# Patient Record
Sex: Female | Born: 1975 | Race: White | Hispanic: No | Marital: Single | Smoking: Never smoker
Health system: Southern US, Community
[De-identification: ages and names within clinical notes are randomized; demographics above are authoritative.]

## PROBLEM LIST (undated history)

## (undated) DIAGNOSIS — J45909 Unspecified asthma, uncomplicated: Secondary | ICD-10-CM

## (undated) DIAGNOSIS — R519 Headache, unspecified: Secondary | ICD-10-CM

## (undated) DIAGNOSIS — F419 Anxiety disorder, unspecified: Secondary | ICD-10-CM

## (undated) DIAGNOSIS — G8220 Paraplegia, unspecified: Secondary | ICD-10-CM

## (undated) DIAGNOSIS — G809 Cerebral palsy, unspecified: Secondary | ICD-10-CM

## (undated) DIAGNOSIS — D649 Anemia, unspecified: Secondary | ICD-10-CM

## (undated) DIAGNOSIS — Z87820 Personal history of traumatic brain injury: Secondary | ICD-10-CM

## (undated) DIAGNOSIS — T8859XA Other complications of anesthesia, initial encounter: Secondary | ICD-10-CM

## (undated) DIAGNOSIS — Z9889 Other specified postprocedural states: Secondary | ICD-10-CM

## (undated) DIAGNOSIS — N390 Urinary tract infection, site not specified: Secondary | ICD-10-CM

## (undated) DIAGNOSIS — Z8489 Family history of other specified conditions: Secondary | ICD-10-CM

## (undated) DIAGNOSIS — H9190 Unspecified hearing loss, unspecified ear: Secondary | ICD-10-CM

## (undated) DIAGNOSIS — R51 Headache: Secondary | ICD-10-CM

## (undated) DIAGNOSIS — R112 Nausea with vomiting, unspecified: Secondary | ICD-10-CM

## (undated) DIAGNOSIS — T4145XA Adverse effect of unspecified anesthetic, initial encounter: Secondary | ICD-10-CM

## (undated) DIAGNOSIS — G43709 Chronic migraine without aura, not intractable, without status migrainosus: Secondary | ICD-10-CM

## (undated) DIAGNOSIS — G90519 Complex regional pain syndrome I of unspecified upper limb: Secondary | ICD-10-CM

## (undated) DIAGNOSIS — K219 Gastro-esophageal reflux disease without esophagitis: Secondary | ICD-10-CM

## (undated) DIAGNOSIS — G709 Myoneural disorder, unspecified: Secondary | ICD-10-CM

## (undated) DIAGNOSIS — M1990 Unspecified osteoarthritis, unspecified site: Secondary | ICD-10-CM

## (undated) DIAGNOSIS — T7840XA Allergy, unspecified, initial encounter: Secondary | ICD-10-CM

## (undated) DIAGNOSIS — F4310 Post-traumatic stress disorder, unspecified: Secondary | ICD-10-CM

## (undated) DIAGNOSIS — F329 Major depressive disorder, single episode, unspecified: Secondary | ICD-10-CM

## (undated) DIAGNOSIS — F32A Depression, unspecified: Secondary | ICD-10-CM

## (undated) HISTORY — DX: Anemia, unspecified: D64.9

## (undated) HISTORY — DX: Major depressive disorder, single episode, unspecified: F32.9

## (undated) HISTORY — DX: Cerebral palsy, unspecified: G80.9

## (undated) HISTORY — DX: Personal history of traumatic brain injury: Z87.820

## (undated) HISTORY — PX: JOINT REPLACEMENT: SHX530

## (undated) HISTORY — DX: Allergy, unspecified, initial encounter: T78.40XA

## (undated) HISTORY — DX: Anxiety disorder, unspecified: F41.9

## (undated) HISTORY — DX: Complex regional pain syndrome I of unspecified upper limb: G90.519

## (undated) HISTORY — DX: Chronic migraine without aura, not intractable, without status migrainosus: G43.709

## (undated) HISTORY — DX: Unspecified hearing loss, unspecified ear: H91.90

## (undated) HISTORY — DX: Paraplegia, unspecified: G82.20

## (undated) HISTORY — PX: OTHER SURGICAL HISTORY: SHX169

## (undated) HISTORY — DX: Unspecified asthma, uncomplicated: J45.909

## (undated) HISTORY — PX: EYE SURGERY: SHX253

## (undated) HISTORY — PX: STRABISMUS SURGERY: SHX218

## (undated) HISTORY — DX: Unspecified osteoarthritis, unspecified site: M19.90

## (undated) HISTORY — DX: Depression, unspecified: F32.A

## (undated) HISTORY — PX: HAMSTRING RELEASE: SHX1723

## (undated) HISTORY — DX: Myoneural disorder, unspecified: G70.9

## (undated) HISTORY — PX: HIP SURGERY: SHX245

---

## 2010-07-12 DIAGNOSIS — H5000 Unspecified esotropia: Secondary | ICD-10-CM | POA: Insufficient documentation

## 2011-04-21 DIAGNOSIS — M5481 Occipital neuralgia: Secondary | ICD-10-CM | POA: Insufficient documentation

## 2011-04-21 DIAGNOSIS — M542 Cervicalgia: Secondary | ICD-10-CM | POA: Insufficient documentation

## 2011-10-13 DIAGNOSIS — F331 Major depressive disorder, recurrent, moderate: Secondary | ICD-10-CM | POA: Insufficient documentation

## 2011-11-29 DIAGNOSIS — M19079 Primary osteoarthritis, unspecified ankle and foot: Secondary | ICD-10-CM | POA: Insufficient documentation

## 2011-11-29 DIAGNOSIS — M1710 Unilateral primary osteoarthritis, unspecified knee: Secondary | ICD-10-CM | POA: Insufficient documentation

## 2011-11-29 DIAGNOSIS — G801 Spastic diplegic cerebral palsy: Secondary | ICD-10-CM | POA: Insufficient documentation

## 2013-04-08 DIAGNOSIS — G894 Chronic pain syndrome: Secondary | ICD-10-CM | POA: Insufficient documentation

## 2013-04-16 DIAGNOSIS — L89309 Pressure ulcer of unspecified buttock, unspecified stage: Secondary | ICD-10-CM | POA: Insufficient documentation

## 2013-08-22 DIAGNOSIS — M7500 Adhesive capsulitis of unspecified shoulder: Secondary | ICD-10-CM | POA: Insufficient documentation

## 2013-08-30 DIAGNOSIS — K589 Irritable bowel syndrome without diarrhea: Secondary | ICD-10-CM | POA: Insufficient documentation

## 2014-01-21 DIAGNOSIS — R200 Anesthesia of skin: Secondary | ICD-10-CM | POA: Insufficient documentation

## 2014-01-21 DIAGNOSIS — R202 Paresthesia of skin: Secondary | ICD-10-CM

## 2014-10-09 DIAGNOSIS — S46211A Strain of muscle, fascia and tendon of other parts of biceps, right arm, initial encounter: Secondary | ICD-10-CM | POA: Insufficient documentation

## 2014-10-09 DIAGNOSIS — M19011 Primary osteoarthritis, right shoulder: Secondary | ICD-10-CM | POA: Insufficient documentation

## 2014-10-26 DIAGNOSIS — E049 Nontoxic goiter, unspecified: Secondary | ICD-10-CM | POA: Insufficient documentation

## 2015-02-16 ENCOUNTER — Ambulatory Visit (INDEPENDENT_AMBULATORY_CARE_PROVIDER_SITE_OTHER): Payer: BLUE CROSS/BLUE SHIELD | Admitting: Family Medicine

## 2015-02-16 VITALS — BP 110/64 | HR 90 | Temp 98.2°F | Resp 18

## 2015-02-16 DIAGNOSIS — R319 Hematuria, unspecified: Secondary | ICD-10-CM | POA: Diagnosis not present

## 2015-02-16 DIAGNOSIS — N10 Acute tubulo-interstitial nephritis: Secondary | ICD-10-CM | POA: Diagnosis not present

## 2015-02-16 LAB — POCT CBC
Granulocyte percent: 82.7 %G — AB (ref 37–80)
HEMATOCRIT: 36.8 % — AB (ref 37.7–47.9)
Hemoglobin: 12.6 g/dL (ref 12.2–16.2)
Lymph, poc: 1.7 (ref 0.6–3.4)
MCH: 29.6 pg (ref 27–31.2)
MCHC: 34.3 g/dL (ref 31.8–35.4)
MCV: 86.4 fL (ref 80–97)
MID (CBC): 0.2 (ref 0–0.9)
MPV: 6.6 fL (ref 0–99.8)
POC GRANULOCYTE: 8.8 — AB (ref 2–6.9)
POC LYMPH PERCENT: 15.7 %L (ref 10–50)
POC MID %: 1.6 % (ref 0–12)
Platelet Count, POC: 214 10*3/uL (ref 142–424)
RBC: 4.26 M/uL (ref 4.04–5.48)
RDW, POC: 14.7 %
WBC: 10.6 10*3/uL — AB (ref 4.6–10.2)

## 2015-02-16 LAB — POCT URINALYSIS DIP (MANUAL ENTRY)
Bilirubin, UA: NEGATIVE
Glucose, UA: NEGATIVE
Ketones, POC UA: NEGATIVE
NITRITE UA: NEGATIVE
PROTEIN UA: NEGATIVE
SPEC GRAV UA: 1.01
UROBILINOGEN UA: 0.2
pH, UA: 6.5

## 2015-02-16 LAB — POC MICROSCOPIC URINALYSIS (UMFC): Mucus: ABSENT

## 2015-02-16 MED ORDER — CEFTRIAXONE SODIUM 1 G IJ SOLR
1.0000 g | Freq: Once | INTRAMUSCULAR | Status: DC
  Administered 2015-02-16: 1 g via INTRAMUSCULAR

## 2015-02-16 MED ORDER — CIPROFLOXACIN HCL 500 MG PO TABS: 500.0000 mg | ORAL_TABLET | Freq: Two times a day (BID) | ORAL | Status: DC

## 2015-02-16 MED ORDER — CEFTRIAXONE SODIUM 1 G IJ SOLR
1.0000 g | INTRAMUSCULAR | Status: DC
  Administered 2015-02-17 – 2015-02-18 (×2): 1 g via INTRAMUSCULAR

## 2015-02-16 NOTE — Patient Instructions (Signed)

## 2015-02-16 NOTE — Progress Notes (Signed)
Subjective:  This chart was scribed for [name] MD, by Tamsen Roers, at [facility].  This patient was seen in room 3 and the patient's care was started at 9:53 AM.    Patient ID: Carolyn Pena, female    DOB: 14-Apr-1975, 39 y.o.   MRN: [id number] Chief Complaint  Patient presents with  . Urinary Tract Infection    x10 days   . Dysuria  . Hematuria  . Back Pain  . Nausea  . Urinary Frequency  . Flu Vaccine    HPI HPI Comments: Carolyn Pena is a 39 y.o. female who presents to the [facility] complaining of multiple symptoms including dysuria, hematuria, frequency as well as nausea onset 10 days ago.  She has associated symptoms of a fever/chills which she first noticed five days ago.  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  She notes that she is familiar with the symptoms.  Carolyn Pena does not have an accessible restrooms and states that she has been holding it until she goes home from work.  She has taken medication but denies any relief. She states that rocephin has helped her significantly in the past.   She denies any vomiting or rashes.    Patient just moved to [place] from [place] 1 week ago. Patient worked at [employer].  She is currently a Mudlogger at [employer].        Past Medical History  Diagnosis Date  . Allergy   . Anemia   . Anxiety   . Arthritis   . Asthma   . Depression   . Neuromuscular disorder ([place])   . CP (cerebral palsy) ([place])     No current outpatient prescriptions on file prior to visit.   No current facility-administered medications on file prior to visit.    Allergies  Allergen Reactions  . Codeine Nausea And Vomiting  . Lortab [Hydrocodone-Acetaminophen] Nausea And Vomiting    Review of Systems  Constitutional: Positive for fever and chills.  HENT: Negative for drooling, facial swelling and hearing loss.   Eyes:  Negative for pain, redness and itching.  Respiratory: Negative for cough, choking and shortness of breath.   Gastrointestinal: Positive for nausea. Negative for vomiting.  Genitourinary: Positive for dysuria, frequency and hematuria.  Musculoskeletal: Positive for back pain. Negative for neck pain and neck stiffness.  Skin: Negative for rash.  Neurological: Negative for syncope and speech difficulty.       Objective:   Physical Exam  Constitutional: She is oriented to person, place, and time. She appears well-developed and well-nourished. No distress.  HENT:  Head: Normocephalic and atraumatic.  Eyes: Pupils are equal, round, and reactive to light.  Cardiovascular:  No murmur heard. Tachycardic but regular rhythm. Normal S1 S2  Pulmonary/Chest: Effort normal and breath sounds normal. No respiratory distress. She has no wheezes. She has no rales.  Neurological: She is alert and oriented to person, place, and time.  Psychiatric: She has a normal mood and affect. Her behavior is normal.   Filed Vitals:   02/16/15 0902  BP: 110/64  Pulse: 90  Temp: 98.2 F (36.8 C)  TempSrc: Oral  Resp: 18  SpO2: 97%    Results for orders placed or performed in visit on 02/16/15  POCT Microscopic Urinalysis ([facility])  Result Value Ref Range   WBC,UR,HPF,POC Few (A) None WBC/hpf   RBC,UR,HPF,POC Few (A) None RBC/hpf  Bacteria Few (A) None, Too numerous to count   Mucus Absent Absent   Epithelial Cells, UR Per Microscopy Few (A) None, Too numerous to count cells/hpf  POCT urinalysis dipstick  Result Value Ref Range   Color, UA yellow yellow   Clarity, UA clear clear   Glucose, UA negative negative   Bilirubin, UA negative negative   Ketones, POC UA negative negative   Spec Grav, UA 1.010    Blood, UA trace-lysed (A) negative   pH, UA 6.5    Protein Ur, POC negative negative   Urobilinogen, UA 0.2    Nitrite, UA Negative Negative   Leukocytes, UA small (1+) (A) Negative  POCT CBC    Result Value Ref Range   WBC 10.6 (A) 4.6 - 10.2 K/uL   Lymph, poc 1.7 0.6 - 3.4   POC LYMPH PERCENT 15.7 10 - 50 %L   MID (cbc) 0.2 0 - 0.9   POC MID % 1.6 0 - 12 %M   POC Granulocyte 8.8 (A) 2 - 6.9   Granulocyte percent 82.7 (A) 37 - 80 %G   RBC 4.26 4.04 - 5.48 M/uL   Hemoglobin 12.6 12.2 - 16.2 g/dL   HCT, POC 36.8 (A) 37.7 - 47.9 %   MCV 86.4 80 - 97 fL   MCH, POC 29.6 27 - 31.2 pg   MCHC 34.3 31.8 - 35.4 g/dL   RDW, POC 14.7 %   Platelet Count, POC 214 142 - 424 K/uL   MPV 6.6 0 - 99.8 fL       Assessment & Plan:   1. Blood in urine   2. Acute pyelonephritis   Pt reports h/o pyelo req hosp several times and now with recurrent sxs for >1 wk so will treat with 1g Rocephin IM x 3 - gave cards for nurse only visit tomorrow and then on day3 (48 hrs) recheck w/ me, sooner if worse.  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  Orders Placed This Encounter  Procedures  . Urine culture  . Comprehensive metabolic panel  . POCT Microscopic Urinalysis ([facility])  . POCT urinalysis dipstick  . POCT CBC    Meds ordered this encounter  Medications  . clonazePAM (KLONOPIN) 1 MG tablet    Sig: Take 1 mg by mouth 2 (two) times daily.  . butalbital-acetaminophen-caffeine (FIORICET, ESGIC) 50-325-40 MG tablet    Sig: Take 1 tablet by mouth 2 (two) times daily as needed for headache.  . botulinum toxin Type A (BOTOX) 100 UNITS SOLR injection    Sig: Inject 100 Units into the muscle every 3 (three) months.  . methocarbamol (ROBAXIN) 500 MG tablet    Sig: Take 500 mg by mouth 3 (three) times daily.  Marland Kitchen gabapentin (NEURONTIN) 600 MG tablet    Sig: Take 600 mg by mouth 3 (three) times daily.  . promethazine (PHENERGAN) 12.5 MG tablet    Sig: Take 12.5 mg by mouth every 6  (six) hours as needed for nausea or vomiting.  Marland Kitchen DISCONTD: ondansetron (ZOFRAN-ODT) 4 MG disintegrating tablet    Sig: Take 4 mg by mouth every 8 (eight) hours as needed for nausea or vomiting.  Marland Kitchen DISCONTD: esomeprazole (NEXIUM) 20 MG capsule    Sig: Take 20 mg by mouth  daily at 12 noon.  . sertraline (ZOLOFT) 100 MG tablet    Sig: Take 100 mg by mouth daily.  Marland Kitchen levocetirizine (XYZAL) 5 MG tablet    Sig: Take 5 mg by mouth every evening.  . naproxen (NAPROSYN) 500 MG tablet    Sig: Take 500 mg by mouth 2 (two) times daily with a meal.  . L-Methylfolate (DEPLIN) 15 MG TABS    Sig: Take 15 mg by mouth daily.  . Fluticasone-Salmeterol (ADVAIR) 100-50 MCG/DOSE AEPB    Sig: Inhale 1 puff into the lungs 2 (two) times daily.  Marland Kitchen albuterol (PROVENTIL HFA;VENTOLIN HFA) 108 (90 BASE) MCG/ACT inhaler    Sig: Inhale 2 puffs into the lungs every 6 (six) hours as needed for wheezing or shortness of breath.  . traZODone (DESYREL) 50 MG tablet    Sig: Take 50 mg by mouth 3 (three) times daily.  . diclofenac (VOLTAREN) 50 MG EC tablet    Sig: Take 50 mg by mouth 2 (two) times daily.  . busPIRone (BUSPAR) 5 MG tablet    Sig: Take 5 mg by mouth 2 (two) times daily.  Marland Kitchen ketorolac (TORADOL) 30 MG/ML injection    Sig: Inject 30 mg into the vein once.  . nystatin (MYCOSTATIN/NYSTOP) 100000 UNIT/GM POWD    Sig: Apply topically.  . predniSONE (DELTASONE) 5 MG tablet    Sig: Take 5 mg by mouth daily with breakfast.  . traMADol (ULTRAM) 50 MG tablet    Sig: Take by mouth every 6 (six) hours as needed.  . etonogestrel (NEXPLANON) 68 MG IMPL implant    Sig: 1 each by Subdermal route once.  Earney Navy Bicarbonate (ZEGERID PO)    Sig: Take by mouth.  . cefUROXime (CEFTIN) 500 MG tablet    Sig: Take 500 mg by mouth 2 (two) times daily with a meal.  . benzonatate (TESSALON) 200 MG capsule    Sig: Take 200 mg by mouth 3 (three) times daily as needed for cough.  . cefTRIAXone (ROCEPHIN) injection 1 g     Sig:     Order Specific Question:  Antibiotic Indication:    Answer:  UTI  . cefTRIAXone (ROCEPHIN) injection 1 g    Sig:     Order Specific Question:  Antibiotic Indication:    Answer:  UTI  . ciprofloxacin (CIPRO) 500 MG tablet    Sig: Take 1 tablet (500 mg total) by mouth 2 (two) times daily.    Dispense:  6 tablet    Refill:  0    I personally performed the services described in this documentation, which was scribed in my presence. The recorded information has been reviewed and considered, and addended by me as needed.  [name], MD MPH

## 2015-02-17 ENCOUNTER — Telehealth: Payer: Self-pay

## 2015-02-17 ENCOUNTER — Ambulatory Visit (INDEPENDENT_AMBULATORY_CARE_PROVIDER_SITE_OTHER): Payer: BLUE CROSS/BLUE SHIELD

## 2015-02-17 DIAGNOSIS — N10 Acute tubulo-interstitial nephritis: Secondary | ICD-10-CM | POA: Diagnosis not present

## 2015-02-17 DIAGNOSIS — R319 Hematuria, unspecified: Secondary | ICD-10-CM

## 2015-02-17 MED ORDER — FLUCONAZOLE 150 MG PO TABS
150.0000 mg | ORAL_TABLET | Freq: Once | ORAL | Status: DC
Start: 2015-02-17 — End: 2015-03-12

## 2015-02-17 NOTE — Telephone Encounter (Signed)
Dr. Brigitte Pulse, this patient came into today for her Rocephin injection and wanted to know if you could call in a RX for Diflucan. She stated that she does not currently have a yeast infection, but she wants to have the medication on standby just in case. She uses the [facility] on [address].   She can be reached at [phone].

## 2015-02-17 NOTE — Telephone Encounter (Signed)
Pt advised.

## 2015-02-17 NOTE — Telephone Encounter (Signed)
rx sent

## 2015-02-18 ENCOUNTER — Ambulatory Visit (INDEPENDENT_AMBULATORY_CARE_PROVIDER_SITE_OTHER): Payer: BLUE CROSS/BLUE SHIELD | Admitting: Family Medicine

## 2015-02-18 VITALS — BP 117/77 | HR 67 | Temp 99.0°F | Resp 18

## 2015-02-18 DIAGNOSIS — K529 Noninfective gastroenteritis and colitis, unspecified: Secondary | ICD-10-CM | POA: Diagnosis not present

## 2015-02-18 DIAGNOSIS — N10 Acute tubulo-interstitial nephritis: Secondary | ICD-10-CM

## 2015-02-18 LAB — POC MICROSCOPIC URINALYSIS (UMFC): Mucus: ABSENT

## 2015-02-18 LAB — POCT URINALYSIS DIP (MANUAL ENTRY)
BILIRUBIN UA: NEGATIVE
BILIRUBIN UA: NEGATIVE
GLUCOSE UA: NEGATIVE
LEUKOCYTES UA: NEGATIVE
NITRITE UA: NEGATIVE
Protein Ur, POC: NEGATIVE
Spec Grav, UA: 1.01
Urobilinogen, UA: 0.2
pH, UA: 7

## 2015-02-18 LAB — POCT CBC
Granulocyte percent: 76.3 %G (ref 37–80)
HCT, POC: 37 % — AB (ref 37.7–47.9)
Hemoglobin: 12.7 g/dL (ref 12.2–16.2)
LYMPH, POC: 2 (ref 0.6–3.4)
MCH, POC: 28.9 pg (ref 27–31.2)
MCHC: 34.3 g/dL (ref 31.8–35.4)
MCV: 84.3 fL (ref 80–97)
MID (CBC): 0.6 (ref 0–0.9)
MPV: 6.3 fL (ref 0–99.8)
POC Granulocyte: 8.4 — AB (ref 2–6.9)
POC LYMPH %: 18.4 % (ref 10–50)
POC MID %: 5.3 % (ref 0–12)
Platelet Count, POC: 227 10*3/uL (ref 142–424)
RBC: 4.39 M/uL (ref 4.04–5.48)
RDW, POC: 14.6 %
WBC: 11 10*3/uL — AB (ref 4.6–10.2)

## 2015-02-18 LAB — COMPREHENSIVE METABOLIC PANEL
ALBUMIN: 4.2 g/dL (ref 3.6–5.1)
ALK PHOS: 67 U/L (ref 33–115)
ALT: 15 U/L (ref 6–29)
AST: 14 U/L (ref 10–30)
BUN: 10 mg/dL (ref 7–25)
CALCIUM: 9.1 mg/dL (ref 8.6–10.2)
CO2: 24 mmol/L (ref 20–31)
Chloride: 101 mmol/L (ref 98–110)
Creat: 0.65 mg/dL (ref 0.50–1.10)
Glucose, Bld: 78 mg/dL (ref 65–99)
POTASSIUM: 3.5 mmol/L (ref 3.5–5.3)
Sodium: 138 mmol/L (ref 135–146)
TOTAL PROTEIN: 6.9 g/dL (ref 6.1–8.1)
Total Bilirubin: 0.2 mg/dL (ref 0.2–1.2)

## 2015-02-18 MED ORDER — PHENAZOPYRIDINE HCL 100 MG PO TABS
200.0000 mg | ORAL_TABLET | Freq: Once | ORAL | Status: AC
  Administered 2015-02-18: 200 mg via ORAL

## 2015-02-18 MED ORDER — PHENAZOPYRIDINE HCL 200 MG PO TABS: 200.0000 mg | ORAL_TABLET | Freq: Three times a day (TID) | ORAL | Status: DC | PRN

## 2015-02-18 MED ORDER — ESOMEPRAZOLE MAGNESIUM 40 MG PO CPDR: 40.0000 mg | DELAYED_RELEASE_CAPSULE | Freq: Every day | ORAL | Status: DC

## 2015-02-18 MED ORDER — SULFAMETHOXAZOLE-TRIMETHOPRIM 800-160 MG PO TABS: 1.0000 | ORAL_TABLET | Freq: Two times a day (BID) | ORAL | Status: DC

## 2015-02-18 MED ORDER — ONDANSETRON 4 MG PO TBDP: 4.0000 mg | ORAL_TABLET | Freq: Three times a day (TID) | ORAL | Status: DC | PRN

## 2015-02-18 MED ORDER — ONDANSETRON 4 MG PO TBDP
8.0000 mg | ORAL_TABLET | Freq: Once | ORAL | Status: AC
  Administered 2015-02-18: 8 mg via ORAL

## 2015-02-18 NOTE — Patient Instructions (Addendum)
Start probiotics Increase your nexium Stop the cipro. You can take a SMALL amount of imodium to slow down the diarrhea but we don't want to plug you up.  Food Choices to Help Relieve Diarrhea, Adult When you have diarrhea, the foods you eat and your eating habits are very important. Choosing the right foods and drinks can help relieve diarrhea. Also, because diarrhea can last up to 7 days, you need to replace lost fluids and electrolytes (such as sodium, potassium, and chloride) in order to help prevent dehydration.  WHAT GENERAL GUIDELINES DO I NEED TO FOLLOW?  Slowly drink 1 cup (8 oz) of fluid for each episode of diarrhea. If you are getting enough fluid, your urine will be clear or pale yellow.  Eat starchy foods. Some good choices include white rice, white toast, pasta, low-fiber cereal, baked potatoes (without the skin), saltine crackers, and bagels.  Avoid large servings of any cooked vegetables.  Limit fruit to two servings per day. A serving is  cup or 1 small piece.  Choose foods with less than 2 g of fiber per serving.  Limit fats to less than 8 tsp (38 g) per day.  Avoid fried foods.  Eat foods that have probiotics in them. Probiotics can be found in certain dairy products.  Avoid foods and beverages that may increase the speed at which food moves through the stomach and intestines (gastrointestinal tract). Things to avoid include:  High-fiber foods, such as dried fruit, raw fruits and vegetables, nuts, seeds, and whole grain foods.  Spicy foods and high-fat foods.  Foods and beverages sweetened with high-fructose corn syrup, honey, or sugar alcohols such as xylitol, sorbitol, and mannitol. WHAT FOODS ARE RECOMMENDED? Grains White rice. White, [place], or pita breads (fresh or toasted), including plain rolls, buns, or bagels. White pasta. Saltine, soda, or graham crackers. Pretzels. Low-fiber cereal. Cooked cereals made with water (such as cornmeal, farina, or cream  cereals). Plain muffins. Matzo. Melba toast. Zwieback.  Vegetables Potatoes (without the skin). Strained tomato and vegetable juices. Most well-cooked and canned vegetables without seeds. Tender lettuce. Fruits Cooked or canned applesauce, apricots, cherries, fruit cocktail, grapefruit, peaches, pears, or plums. Fresh bananas, apples without skin, cherries, grapes, cantaloupe, grapefruit, peaches, oranges, or plums.  Meat and Other Protein Products Baked or boiled chicken. Eggs. Tofu. Fish. Seafood. Smooth peanut butter. Ground or well-cooked tender beef, ham, veal, lamb, pork, or poultry.  Dairy Plain yogurt, kefir, and unsweetened liquid yogurt. Lactose-free milk, buttermilk, or soy milk. Plain hard cheese. Beverages Sport drinks. Clear broths. Diluted fruit juices (except prune). Regular, caffeine-free sodas such as ginger ale. Water. Decaffeinated teas. Oral rehydration solutions. Sugar-free beverages not sweetened with sugar alcohols. Other Bouillon, broth, or soups made from recommended foods.  The items listed above may not be a complete list of recommended foods or beverages. Contact your dietitian for more options. WHAT FOODS ARE NOT RECOMMENDED? Grains Whole grain, whole wheat, bran, or rye breads, rolls, pastas, crackers, and cereals. Wild or brown rice. Cereals that contain more than 2 g of fiber per serving. Corn tortillas or taco shells. Cooked or dry oatmeal. Granola. Popcorn. Vegetables Raw vegetables. Cabbage, broccoli, Brussels sprouts, artichokes, baked beans, beet greens, corn, kale, legumes, peas, sweet potatoes, and yams. Potato skins. Cooked spinach and cabbage. Fruits Dried fruit, including raisins and dates. Raw fruits. Stewed or dried prunes. Fresh apples with skin, apricots, mangoes, pears, raspberries, and strawberries.  Meat and Other Protein Products Chunky peanut butter. Nuts and seeds. Beans and  lentils. Berniece Salines.  Dairy High-fat cheeses. Milk, chocolate milk, and  beverages made with milk, such as milk shakes. Cream. Ice cream. Sweets and Desserts Sweet rolls, doughnuts, and sweet breads. Pancakes and waffles. Fats and Oils Butter. Cream sauces. Margarine. Salad oils. Plain salad dressings. Olives. Avocados.  Beverages Caffeinated beverages (such as coffee, tea, soda, or energy drinks). Alcoholic beverages. Fruit juices with pulp. Prune juice. Soft drinks sweetened with high-fructose corn syrup or sugar alcohols. Other Coconut. Hot sauce. Chili powder. Mayonnaise. Gravy. Cream-based or milk-based soups.  The items listed above may not be a complete list of foods and beverages to avoid. Contact your dietitian for more information. WHAT SHOULD I DO IF I BECOME DEHYDRATED? Diarrhea can sometimes lead to dehydration. Signs of dehydration include dark urine and dry mouth and skin. If you think you are dehydrated, you should rehydrate with an oral rehydration solution. These solutions can be purchased at pharmacies, retail stores, or online.  Drink -1 cup (120-240 mL) of oral rehydration solution each time you have an episode of diarrhea. If drinking this amount makes your diarrhea worse, try drinking smaller amounts more often. For example, drink 1-3 tsp (5-15 mL) every 5-10 minutes.  A general rule for staying hydrated is to drink 1-2 L of fluid per day. Talk to your health care provider about the specific amount you should be drinking each day. Drink enough fluids to keep your urine clear or pale yellow.   This information is not intended to replace advice given to you by your health care provider. Make sure you discuss any questions you have with your health care provider.   Document Released: 05/07/2003 Document Revised: 03/07/2014 Document Reviewed: 01/07/2013 Elsevier Interactive Patient Education [employer].

## 2015-02-18 NOTE — Progress Notes (Signed)
Subjective:  This chart was scribed for Carolyn Cheadle, MD by [name], Medical Scribe. This patient was seen in Room 12 and the patient's care was started 8:49 AM.   Patient ID: Carolyn Pena, female    DOB: 10/21/1975, 39 y.o.   MRN: [id number] Chief Complaint  Patient presents with  . Follow-up    with Dr. Brigitte Pulse   HPI Carolyn Pena is a 39 y.o. female who has h/o cerebral palsy presents to [facility] for follow up.  She is in an Dealer wheelchair and recently started a new job. The new job doesn't have accessible bathrooms yet. She would hold her urine in and now has UTI. She had nephrolithiasis in the past. Unfortunately, urine culture and cmp were not sent out.   Pt states that the dysuria has improved but still notices some frequency. She has some lower abd pain with frequent, very loose diarrhea that started yesterday with little volume. She is still feeling nauseous but without vomiting. She has taken zofran yesterday and last night. She feels bloated in her abd and is passing more gas. She denies belching. She takes nexium for heartburn. She denies history of hernia. She denies any surgeries on her abd.   She also states that her back is still hurting (left > right) but has improved. During her last visit, she rates her pain 9/10. This has improved to 7/10 pain today. She states having a recent ct of abd for her kidney before she left [place]. She has agreed to another round of rocephin. She's done well on pyridium before.   Past Medical History  Diagnosis Date  . Allergy   . Anemia   . Anxiety   . Arthritis   . Asthma   . Depression   . Neuromuscular disorder ([place])   . CP (cerebral palsy) ([place])    Prior to Admission medications   Medication Sig Start Date End Date Taking? Authorizing Provider  albuterol (PROVENTIL HFA;VENTOLIN HFA) 108 (90 BASE) MCG/ACT inhaler Inhale 2 puffs into the lungs every 6 (six) hours as needed for wheezing or shortness of breath.   Yes Historical  Provider, MD  benzonatate (TESSALON) 200 MG capsule Take 200 mg by mouth 3 (three) times daily as needed for cough.   Yes Historical Provider, MD  botulinum toxin Type A (BOTOX) 100 UNITS SOLR injection Inject 100 Units into the muscle every 3 (three) months.   Yes Historical Provider, MD  busPIRone (BUSPAR) 5 MG tablet Take 5 mg by mouth 2 (two) times daily.   Yes Historical Provider, MD  butalbital-acetaminophen-caffeine (FIORICET, ESGIC) 50-325-40 MG tablet Take 1 tablet by mouth 2 (two) times daily as needed for headache.   Yes Historical Provider, MD  cefUROXime (CEFTIN) 500 MG tablet Take 500 mg by mouth 2 (two) times daily with a meal.   Yes Historical Provider, MD  ciprofloxacin (CIPRO) 500 MG tablet Take 1 tablet (500 mg total) by mouth 2 (two) times daily. 02/16/15  Yes Shawnee Knapp, MD  clonazePAM (KLONOPIN) 1 MG tablet Take 1 mg by mouth 2 (two) times daily.   Yes Historical Provider, MD  diclofenac (VOLTAREN) 50 MG EC tablet Take 50 mg by mouth 2 (two) times daily.   Yes Historical Provider, MD  esomeprazole (NEXIUM) 20 MG capsule Take 20 mg by mouth daily at 12 noon.   Yes Historical Provider, MD  etonogestrel (NEXPLANON) 68 MG IMPL implant 1 each by Subdermal route once.   Yes Historical Provider, MD  fluconazole (  DIFLUCAN) 150 MG tablet Take 1 tablet (150 mg total) by mouth once. Repeat if needed after 3 days 02/17/15  Yes Shawnee Knapp, MD  Fluticasone-Salmeterol (ADVAIR) 100-50 MCG/DOSE AEPB Inhale 1 puff into the lungs 2 (two) times daily.   Yes Historical Provider, MD  gabapentin (NEURONTIN) 600 MG tablet Take 600 mg by mouth 3 (three) times daily.   Yes Historical Provider, MD  ketorolac (TORADOL) 30 MG/ML injection Inject 30 mg into the vein once.   Yes Historical Provider, MD  L-Methylfolate (DEPLIN) 15 MG TABS Take 15 mg by mouth daily.   Yes Historical Provider, MD  levocetirizine (XYZAL) 5 MG tablet Take 5 mg by mouth every evening.   Yes Historical Provider, MD  methocarbamol  (ROBAXIN) 500 MG tablet Take 500 mg by mouth 3 (three) times daily.   Yes Historical Provider, MD  naproxen (NAPROSYN) 500 MG tablet Take 500 mg by mouth 2 (two) times daily with a meal.   Yes Historical Provider, MD  nystatin (MYCOSTATIN/NYSTOP) 100000 UNIT/GM POWD Apply topically.   Yes Historical Provider, MD  Omeprazole-Sodium Bicarbonate (ZEGERID PO) Take by mouth.   Yes Historical Provider, MD  ondansetron (ZOFRAN-ODT) 4 MG disintegrating tablet Take 4 mg by mouth every 8 (eight) hours as needed for nausea or vomiting.   Yes Historical Provider, MD  predniSONE (DELTASONE) 5 MG tablet Take 5 mg by mouth daily with breakfast.   Yes Historical Provider, MD  promethazine (PHENERGAN) 12.5 MG tablet Take 12.5 mg by mouth every 6 (six) hours as needed for nausea or vomiting.   Yes Historical Provider, MD  sertraline (ZOLOFT) 100 MG tablet Take 100 mg by mouth daily.   Yes Historical Provider, MD  traMADol (ULTRAM) 50 MG tablet Take by mouth every 6 (six) hours as needed.   Yes Historical Provider, MD  traZODone (DESYREL) 50 MG tablet Take 50 mg by mouth 3 (three) times daily.   Yes Historical Provider, MD   Allergies  Allergen Reactions  . Codeine Nausea And Vomiting  . Lortab [Hydrocodone-Acetaminophen] Nausea And Vomiting   Review of Systems  Gastrointestinal: Positive for nausea, abdominal pain, diarrhea and abdominal distention. Negative for vomiting, constipation and blood in stool.  Genitourinary: Positive for dysuria and frequency. Negative for urgency and hematuria.  Musculoskeletal: Positive for back pain.  Skin: Negative for rash and wound.       Objective:   Physical Exam  Constitutional: She is oriented to person, place, and time. She appears well-developed and well-nourished. No distress.  HENT:  Head: Normocephalic and atraumatic.  Eyes: EOM are normal. Pupils are equal, round, and reactive to light.  Neck: Neck supple.  Cardiovascular: Normal rate, regular rhythm, S1  normal, S2 normal and normal heart sounds.   No murmur heard. Pulmonary/Chest: Effort normal. No respiratory distress.  Abdominal: Soft. Bowel sounds are normal. She exhibits no distension. There is tenderness (general tenderness to palpation). There is guarding (mild) and CVA tenderness (left > right). There is no rebound.  Musculoskeletal: Normal range of motion.  Neurological: She is alert and oriented to person, place, and time.  Skin: Skin is warm and dry.  Psychiatric: She has a normal mood and affect. Her behavior is normal.  Nursing note and vitals reviewed.   BP 117/77 mmHg  Pulse 67  Temp(Src) 99 F (37.2 C) (Oral)  Resp 18  SpO2 96%  LMP 01/31/2015   Results for orders placed or performed in visit on 02/18/15  POCT urinalysis dipstick  Result Value Ref  Range   Color, UA light yellow (A) yellow   Clarity, UA clear clear   Glucose, UA negative negative   Bilirubin, UA negative negative   Ketones, POC UA negative negative   Spec Grav, UA 1.010    Blood, UA trace-lysed (A) negative   pH, UA 7.0    Protein Ur, POC negative negative   Urobilinogen, UA 0.2    Nitrite, UA Negative Negative   Leukocytes, UA Negative Negative  POCT Microscopic Urinalysis ([facility])  Result Value Ref Range   WBC,UR,HPF,POC None None WBC/hpf   RBC,UR,HPF,POC None None RBC/hpf   Bacteria None None, Too numerous to count   Mucus Absent Absent   Epithelial Cells, UR Per Microscopy None None, Too numerous to count cells/hpf  POCT CBC  Result Value Ref Range   WBC 11.0 (A) 4.6 - 10.2 K/uL   Lymph, poc 2.0 0.6 - 3.4   POC LYMPH PERCENT 18.4 10 - 50 %L   MID (cbc) 0.6 0 - 0.9   POC MID % 5.3 0 - 12 %M   POC Granulocyte 8.4 (A) 2 - 6.9   Granulocyte percent 76.3 37 - 80 %G   RBC 4.39 4.04 - 5.48 M/uL   Hemoglobin 12.7 12.2 - 16.2 g/dL   HCT, POC 37.0 (A) 37.7 - 47.9 %   MCV 84.3 80 - 97 fL   MCH, POC 28.9 27 - 31.2 pg   MCHC 34.3 31.8 - 35.4 g/dL   RDW, POC 14.6 %   Platelet Count, POC 227  142 - 424 K/uL   MPV 6.3 0 - 99.8 fL       Assessment & Plan:  Increased nexium from 20 to 40 mg.  Start probiotics. Ok to try some low dose imodium. Start pyridium. Will presume cipro resistance due to lack of significant improvement. On day 3 of cipro AND day 3 of 1g IM Rocephin today with only 20-25% symptomatic improvement and cbc slightly worse. Given 3rd dose of IM Rocephin. will stop cipro today. Recheck tomorrow a.m. With - me - will anticipate 4th Rocephin dose and transitioning to Bactrim to complete pyelo treatment if sxs have continued to improve. If worsening at all -> abd/pelvic CT scan, pelvic/rectal exam (deffered due to difficulty with paraplegia from CP in motorized wheelchair and pt's reported h/o pyelo with similar sxs and course).  1. Acute pyelonephritis   2. Gastroenteritis, acute     Orders Placed This Encounter  Procedures  . Urine culture  . Comprehensive metabolic panel  . POCT urinalysis dipstick  . POCT Microscopic Urinalysis ([facility])  . POCT CBC    Meds ordered this encounter  Medications  . ondansetron (ZOFRAN-ODT) disintegrating tablet 8 mg    Sig:   . sulfamethoxazole-trimethoprim (BACTRIM DS,SEPTRA DS) 800-160 MG tablet    Sig: Take 1 tablet by mouth 2 (two) times daily.    Dispense:  20 tablet    Refill:  0  . esomeprazole (NEXIUM) 40 MG capsule    Sig: Take 1 capsule (40 mg total) by mouth daily at 12 noon.    Dispense:  30 capsule    Refill:  11  . phenazopyridine (PYRIDIUM) tablet 200 mg    Sig:   . ondansetron (ZOFRAN-ODT) 4 MG disintegrating tablet    Sig: Take 1-2 tablets (4-8 mg total) by mouth every 8 (eight) hours as needed for nausea or vomiting.    Dispense:  30 tablet    Refill:  2  . phenazopyridine (PYRIDIUM) 200  MG tablet    Sig: Take 1 tablet (200 mg total) by mouth 3 (three) times daily as needed for pain.    Dispense:  20 tablet    Refill:  1    I personally performed the services described in this documentation,  which was scribed in my presence. The recorded information has been reviewed and considered, and addended by me as needed.  Carolyn Cheadle, MD MPH    By signing my name below, I, [name], attest that this documentation has been prepared under the direction and in the presence of Carolyn Cheadle, MD Electronically Signed: [name], [place]. 02/18/2015 , 8:49 AM .

## 2015-02-19 ENCOUNTER — Encounter: Payer: Self-pay | Admitting: Family Medicine

## 2015-02-19 ENCOUNTER — Ambulatory Visit (INDEPENDENT_AMBULATORY_CARE_PROVIDER_SITE_OTHER): Payer: BLUE CROSS/BLUE SHIELD | Admitting: Family Medicine

## 2015-02-19 ENCOUNTER — Telehealth: Payer: Self-pay | Admitting: *Deleted

## 2015-02-19 VITALS — BP 120/77 | HR 81 | Temp 98.4°F | Resp 16

## 2015-02-19 DIAGNOSIS — M359 Systemic involvement of connective tissue, unspecified: Secondary | ICD-10-CM | POA: Diagnosis not present

## 2015-02-19 DIAGNOSIS — G808 Other cerebral palsy: Secondary | ICD-10-CM

## 2015-02-19 DIAGNOSIS — Z993 Dependence on wheelchair: Secondary | ICD-10-CM | POA: Diagnosis not present

## 2015-02-19 DIAGNOSIS — G43909 Migraine, unspecified, not intractable, without status migrainosus: Secondary | ICD-10-CM | POA: Diagnosis not present

## 2015-02-19 DIAGNOSIS — D8989 Other specified disorders involving the immune mechanism, not elsewhere classified: Secondary | ICD-10-CM

## 2015-02-19 DIAGNOSIS — F32A Depression, unspecified: Secondary | ICD-10-CM

## 2015-02-19 DIAGNOSIS — F329 Major depressive disorder, single episode, unspecified: Secondary | ICD-10-CM

## 2015-02-19 DIAGNOSIS — K219 Gastro-esophageal reflux disease without esophagitis: Secondary | ICD-10-CM

## 2015-02-19 DIAGNOSIS — G4700 Insomnia, unspecified: Secondary | ICD-10-CM | POA: Diagnosis not present

## 2015-02-19 DIAGNOSIS — G8929 Other chronic pain: Secondary | ICD-10-CM

## 2015-02-19 DIAGNOSIS — N10 Acute tubulo-interstitial nephritis: Secondary | ICD-10-CM | POA: Diagnosis not present

## 2015-02-19 DIAGNOSIS — F411 Generalized anxiety disorder: Secondary | ICD-10-CM | POA: Diagnosis not present

## 2015-02-19 DIAGNOSIS — G9050 Complex regional pain syndrome I, unspecified: Secondary | ICD-10-CM

## 2015-02-19 DIAGNOSIS — J45909 Unspecified asthma, uncomplicated: Secondary | ICD-10-CM | POA: Diagnosis not present

## 2015-02-19 DIAGNOSIS — IMO0002 Reserved for concepts with insufficient information to code with codable children: Secondary | ICD-10-CM

## 2015-02-19 DIAGNOSIS — M549 Dorsalgia, unspecified: Secondary | ICD-10-CM

## 2015-02-19 LAB — URINE CULTURE
COLONY COUNT: NO GROWTH
ORGANISM ID, BACTERIA: NO GROWTH

## 2015-02-19 MED ORDER — CEFTRIAXONE SODIUM 1 G IJ SOLR
1.0000 g | INTRAMUSCULAR | Status: AC
  Administered 2015-02-19: 1 g via INTRAMUSCULAR

## 2015-02-19 NOTE — Telephone Encounter (Signed)
Faxed signed medical authorization for medical records to continue care. Confirmation received at 5:59 pm.

## 2015-02-28 ENCOUNTER — Encounter: Payer: Self-pay | Admitting: Family Medicine

## 2015-02-28 DIAGNOSIS — G90519 Complex regional pain syndrome I of unspecified upper limb: Secondary | ICD-10-CM

## 2015-02-28 DIAGNOSIS — K219 Gastro-esophageal reflux disease without esophagitis: Secondary | ICD-10-CM | POA: Insufficient documentation

## 2015-02-28 DIAGNOSIS — D8989 Other specified disorders involving the immune mechanism, not elsewhere classified: Secondary | ICD-10-CM | POA: Insufficient documentation

## 2015-02-28 DIAGNOSIS — G43909 Migraine, unspecified, not intractable, without status migrainosus: Secondary | ICD-10-CM | POA: Insufficient documentation

## 2015-02-28 DIAGNOSIS — F411 Generalized anxiety disorder: Secondary | ICD-10-CM | POA: Insufficient documentation

## 2015-02-28 DIAGNOSIS — Z993 Dependence on wheelchair: Secondary | ICD-10-CM | POA: Insufficient documentation

## 2015-02-28 DIAGNOSIS — J45909 Unspecified asthma, uncomplicated: Secondary | ICD-10-CM | POA: Insufficient documentation

## 2015-02-28 DIAGNOSIS — F32A Depression, unspecified: Secondary | ICD-10-CM | POA: Insufficient documentation

## 2015-02-28 DIAGNOSIS — M549 Dorsalgia, unspecified: Secondary | ICD-10-CM

## 2015-02-28 DIAGNOSIS — M359 Systemic involvement of connective tissue, unspecified: Secondary | ICD-10-CM | POA: Insufficient documentation

## 2015-02-28 DIAGNOSIS — F329 Major depressive disorder, single episode, unspecified: Secondary | ICD-10-CM | POA: Insufficient documentation

## 2015-02-28 DIAGNOSIS — G8929 Other chronic pain: Secondary | ICD-10-CM | POA: Insufficient documentation

## 2015-02-28 DIAGNOSIS — G808 Other cerebral palsy: Secondary | ICD-10-CM | POA: Insufficient documentation

## 2015-02-28 DIAGNOSIS — G4700 Insomnia, unspecified: Secondary | ICD-10-CM | POA: Insufficient documentation

## 2015-02-28 HISTORY — DX: Complex regional pain syndrome I of unspecified upper limb: G90.519

## 2015-02-28 NOTE — Progress Notes (Signed)
Subjective:    Patient ID: Carolyn Pena, female    DOB: [birth year], 39 y.o.   MRN: [id number] Chief Complaint  Patient presents with  . Follow-up    pyelonephritis    HPI  Still feeling really fatigued but overall heading in the right direction.     Past Medical History  Diagnosis Date  . Allergy   . Anemia   . Anxiety   . Arthritis   . Asthma   . Depression   . Neuromuscular disorder ([place])   . CP (cerebral palsy) [facility])    Past Surgical History  Procedure Laterality Date  . Eye surgery    . Joint replacement     Current Outpatient Prescriptions on File Prior to Visit  Medication Sig Dispense Refill  . albuterol (PROVENTIL HFA;VENTOLIN HFA) 108 (90 BASE) MCG/ACT inhaler Inhale 2 puffs into the lungs every 6 (six) hours as needed for wheezing or shortness of breath.    . benzonatate (TESSALON) 200 MG capsule Take 200 mg by mouth 3 (three) times daily as needed for cough.    . botulinum toxin Type A (BOTOX) 100 UNITS SOLR injection Inject 100 Units into the muscle every 3 (three) months.    . busPIRone (BUSPAR) 5 MG tablet Take 5 mg by mouth 2 (two) times daily.    . butalbital-acetaminophen-caffeine (FIORICET, ESGIC) 50-325-40 MG tablet Take 1 tablet by mouth 2 (two) times daily as needed for headache.    . cefUROXime (CEFTIN) 500 MG tablet Take 500 mg by mouth 2 (two) times daily with a meal.    . clonazePAM (KLONOPIN) 1 MG tablet Take 1 mg by mouth 2 (two) times daily.    . diclofenac (VOLTAREN) 50 MG EC tablet Take 50 mg by mouth 2 (two) times daily.    Marland Kitchen esomeprazole (NEXIUM) 40 MG capsule Take 1 capsule (40 mg total) by mouth daily at 12 noon. 30 capsule 11  . etonogestrel (NEXPLANON) 68 MG IMPL implant 1 each by Subdermal route once.    . fluconazole (DIFLUCAN) 150 MG tablet Take 1 tablet (150 mg total) by mouth once. Repeat if needed after 3 days 2 tablet 1  . Fluticasone-Salmeterol (ADVAIR) 100-50 MCG/DOSE AEPB Inhale 1 puff into the lungs 2 (two) times daily.      Marland Kitchen gabapentin (NEURONTIN) 600 MG tablet Take 600 mg by mouth 3 (three) times daily.    Marland Kitchen ketorolac (TORADOL) 30 MG/ML injection Inject 30 mg into the vein once.    Marland Kitchen L-Methylfolate (DEPLIN) 15 MG TABS Take 15 mg by mouth daily.    Marland Kitchen levocetirizine (XYZAL) 5 MG tablet Take 5 mg by mouth every evening.    . methocarbamol (ROBAXIN) 500 MG tablet Take 500 mg by mouth 3 (three) times daily.    . naproxen (NAPROSYN) 500 MG tablet Take 500 mg by mouth 2 (two) times daily with a meal.    . nystatin (MYCOSTATIN/NYSTOP) 100000 UNIT/GM POWD Apply topically.    Earney Navy Bicarbonate (ZEGERID PO) Take by mouth.    . ondansetron (ZOFRAN-ODT) 4 MG disintegrating tablet Take 1-2 tablets (4-8 mg total) by mouth every 8 (eight) hours as needed for nausea or vomiting. 30 tablet 2  . phenazopyridine (PYRIDIUM) 200 MG tablet Take 1 tablet (200 mg total) by mouth 3 (three) times daily as needed for pain. 20 tablet 1  . predniSONE (DELTASONE) 5 MG tablet Take 5 mg by mouth daily with breakfast.    . promethazine (PHENERGAN) 12.5 MG tablet Take 12.5 mg  by mouth every 6 (six) hours as needed for nausea or vomiting.    . sertraline (ZOLOFT) 100 MG tablet Take 100 mg by mouth daily.    Marland Kitchen sulfamethoxazole-trimethoprim (BACTRIM DS,SEPTRA DS) 800-160 MG tablet Take 1 tablet by mouth 2 (two) times daily. 20 tablet 0  . traMADol (ULTRAM) 50 MG tablet Take by mouth every 6 (six) hours as needed.    . traZODone (DESYREL) 50 MG tablet Take 50 mg by mouth 3 (three) times daily.     No current facility-administered medications on file prior to visit.   Allergies  Allergen Reactions  . Codeine Nausea And Vomiting  . Lortab [Hydrocodone-Acetaminophen] Nausea And Vomiting   Family History  Problem Relation Age of Onset  . Cancer Mother    Social History   Social History  . Marital Status: Single    Spouse Name: N/A  . Number of Children: N/A  . Years of Education: N/A   Social History Main Topics  . Smoking  status: Never Smoker   . Smokeless tobacco: None  . Alcohol Use: None  . Drug Use: No  . Sexual Activity: Not Asked   Other Topics Concern  . None   Social History Narrative   Depression screen [facility] 2/9 02/19/2015 02/18/2015  Decreased Interest 0 0  Down, Depressed, Hopeless 0 0  PHQ - 2 Score 0 0     Review of Systems  Constitutional: Positive for activity change, appetite change and fatigue. Negative for fever, chills and unexpected weight change.  Gastrointestinal: Positive for abdominal pain. Negative for nausea, vomiting, diarrhea, constipation and abdominal distention.  Genitourinary: Negative for dysuria, urgency, enuresis and difficulty urinating.  Musculoskeletal: Positive for myalgias, back pain, joint swelling, arthralgias and gait problem.  Neurological: Positive for weakness, light-headedness and headaches. Negative for dizziness, seizures and syncope.  Hematological: Negative for adenopathy.  Psychiatric/Behavioral: Positive for sleep disturbance. Negative for dysphoric mood. The patient is not nervous/anxious.        Objective:  BP 120/77 mmHg  Pulse 81  Temp(Src) 98.4 F (36.9 C) (Oral)  Resp 16  Ht   Wt   SpO2 97%  LMP 01/31/2015  Physical Exam  Constitutional: She is oriented to person, place, and time. She appears well-developed and well-nourished. No distress.  HENT:  Head: Normocephalic and atraumatic.  Right Ear: External ear normal.  Left Ear: External ear normal.  Eyes: Conjunctivae are normal. No scleral icterus.  Neck: Normal range of motion. Neck supple. No thyromegaly present.  Cardiovascular: Normal rate, regular rhythm, normal heart sounds and intact distal pulses.   Pulmonary/Chest: Effort normal and breath sounds normal. No respiratory distress.  Musculoskeletal: She exhibits no edema.  Lymphadenopathy:    She has no cervical adenopathy.  Neurological: She is alert and oriented to person, place, and time.  Skin: Skin is warm and dry.  She is not diaphoretic. No erythema.  Psychiatric: She has a normal mood and affect. Her behavior is normal.          Assessment & Plan:   1. Acute pyelonephritis  - received 4th dose of Rocephin today, complete course of bactrim. RTC if sxs recur  2. Complex regional pain syndrome   3. Anxiety state   4. Migraine without status migrainosus, not intractable, unspecified migraine type - would like to see neurology to see if she could benefit from the botox inj for migraine control. Cont prn fioricet and prn maxalt, rarely has to use IM toradol.  5. Asthma, unspecified asthma  severity, uncomplicated   6. Autoimmune disorder ([place]) - has chronically very high crp - she has many of the criteria for lupus and for RA but does not fit any diagnosis completely. Was put on chronic prednisone 5 mg to help with inflammation and pain - in part due to the CRPS. At times has to increase prednisone dose when sxs flair  7. Cerebral palsy, hemiplegic ([place])   8. Wheelchair dependent   9. Chronic back pain - uses prn diclofenac and prn tramadol  10. Gastroesophageal reflux disease, esophagitis presence not specified   11. Depression   12. Insomnia    Pt is transferring her care here since she just moved from [facility] - signed releases to get records today and has an appt to establish in 6 wks.  Meds ordered this encounter  Medications  . cefTRIAXone (ROCEPHIN) injection 1 g    Sig:     Order Specific Question:  Antibiotic Indication:    Answer:  UTI    [name], MD MPH

## 2015-03-10 ENCOUNTER — Ambulatory Visit (INDEPENDENT_AMBULATORY_CARE_PROVIDER_SITE_OTHER): Payer: BLUE CROSS/BLUE SHIELD | Admitting: Neurology

## 2015-03-10 ENCOUNTER — Encounter: Payer: Self-pay | Admitting: Neurology

## 2015-03-10 VITALS — BP 128/70 | HR 98

## 2015-03-10 DIAGNOSIS — IMO0002 Reserved for concepts with insufficient information to code with codable children: Secondary | ICD-10-CM

## 2015-03-10 DIAGNOSIS — G43709 Chronic migraine without aura, not intractable, without status migrainosus: Secondary | ICD-10-CM

## 2015-03-10 DIAGNOSIS — G9050 Complex regional pain syndrome I, unspecified: Secondary | ICD-10-CM

## 2015-03-10 DIAGNOSIS — G808 Other cerebral palsy: Secondary | ICD-10-CM | POA: Diagnosis not present

## 2015-03-10 DIAGNOSIS — R600 Localized edema: Secondary | ICD-10-CM

## 2015-03-10 HISTORY — DX: Chronic migraine without aura, not intractable, without status migrainosus: G43.709

## 2015-03-10 MED ORDER — TRAZODONE HCL 50 MG PO TABS: 50.0000 mg | ORAL_TABLET | Freq: Every evening | ORAL | Status: DC | PRN

## 2015-03-10 NOTE — Addendum Note (Signed)
Addended by: Gerda Diss A on: 03/10/2015 02:44 PM   Modules accepted: Orders

## 2015-03-10 NOTE — Progress Notes (Addendum)
NEUROLOGY CONSULTATION NOTE  Shamiah Kahler MRN: [id number] DOB: 1975/11/26  Referring provider: Dr. Brigitte Pulse Primary care provider: Dr. Brigitte Pulse  Reason for consult:  migraine  HISTORY OF PRESENT ILLNESS: Carolyn Pena is a 40 year old left-handed female with cerebral palsy, depression, anxiety, complex regional pain syndrome, asthma and autoimmune disorder who presents for migraine.  History obtained by patient and prior neurologist's notes.  MRI reports and labs reviewed.  MIGRAINES: Onset:  Several years but diagnosed in 2008 Location:  Back of head, bilateral Quality:  pounding Intensity:  7/10 (10/10 when severe) Aura:  no Prodrome:  no Associated symptoms:  Initially with vertigo, nausea and vomiting.  Nausea, photophobia, phonophobia. Duration:  3 hours with medication Frequency:  3 days per week Triggers/exacerbating factors:  unknown Relieving factors:  Maxalt  Past abortive medication:  Sumatriptan [place], Mobic, Midrin, Relpax. Past preventative medication:  Verapamil, topiramate Other past medications for pain:  Cyclobenzaprine, baclofen  Current abortive medication:  First line:  Maxalt, Second line:  Fioricet.  Zofran ODT 42m Antihypertensive medications:  none Antidepressant medications:  sertraline 2017mAnticonvulsant medications:  gabapentin 60032mhree times daily Vitamins/Herbal/Supplements:  MVI  She has an identical twin sister, also with CP, who has migraines and history of seizures.  COMPLEX REGIONAL PAIN SYNDROME: Patient has cerebral palsy and is wheelchair-bound.  She has had multiple injuries due to falls and surgeries.  As a result, she developed CRPS, particularly in the left lower extremity.  She has baseline swelling in the feet but over the past couple of weeks, she developed significant swelling in the left foot.  [place]  This has not been set up yet here, so she has to transfer by herself,  which may have triggered exacerbation of her CRPS.   She developed pain in the right shoulder and exhibited some right upper extremity weakness.  MRI of C-spine showed broad-based disc protrusion causing mild to moderate right neural foraminal stenosis at C5-6.  MRI of brachial plexus was unremarkable.  XR of shoulder showed no fracture.  MRI of the right shoulder revealed partial tear of the supraspinatus muscle.  She was referred to orthopedist who felt pain was due to CP spasticity and not the tear. For pain, she takes gabapentin 600m43mree times daily, Robaxin 500mg39mee times daily as needed, diclofenac 50mg 3me daily, and tramadol 50mg 69mes daily as needed.  She takes prednisone 5mg dai24mfor right shoulder pain.  She has been assessed for autoimmune disease.  ANA, RF, Sjogrens, thyroid peroxidase and thyroglobulin antibodies have been negative.  Reportedly, she has had elevated CRP.  PAST MEDICAL HISTORY: Past Medical History  Diagnosis Date  . Allergy   . Anemia   . Anxiety   . Arthritis   . Asthma   . Depression   . Neuromuscular disorder (HCC)   .[place] (cerebral palsy) (HCC)   .[place] multiple concussions     PAST SURGICAL HISTORY: Past Surgical History  Procedure Laterality Date  . Eye surgery    . Joint replacement      MEDICATIONS: Current Outpatient Prescriptions on File Prior to Visit  Medication Sig Dispense Refill  . albuterol (PROVENTIL HFA;VENTOLIN HFA) 108 (90 BASE) MCG/ACT inhaler Inhale 2 puffs into the lungs every 6 (six) hours as needed for wheezing or shortness of breath.    . benzonatate (TESSALON) 200 MG capsule Take 200 mg by mouth 3 (three) times daily as needed  for cough.    . botulinum toxin Type A (BOTOX) 100 UNITS SOLR injection Inject 100 Units into the muscle every 3 (three) months.    . busPIRone (BUSPAR) 5 MG tablet Take 5 mg by mouth 2 (two) times daily.    . butalbital-acetaminophen-caffeine (FIORICET, ESGIC) 50-325-40 MG tablet Take 1  tablet by mouth 2 (two) times daily as needed for headache.    . clonazePAM (KLONOPIN) 1 MG tablet Take 1 mg by mouth 2 (two) times daily.    . diclofenac (VOLTAREN) 50 MG EC tablet Take 50 mg by mouth 2 (two) times daily.    Marland Kitchen esomeprazole (NEXIUM) 40 MG capsule Take 1 capsule (40 mg total) by mouth daily at 12 noon. 30 capsule 11  . etonogestrel (NEXPLANON) 68 MG IMPL implant 1 each by Subdermal route once.    . fluconazole (DIFLUCAN) 150 MG tablet Take 1 tablet (150 mg total) by mouth once. Repeat if needed after 3 days 2 tablet 1  . Fluticasone-Salmeterol (ADVAIR) 100-50 MCG/DOSE AEPB Inhale 1 puff into the lungs 2 (two) times daily.    Marland Kitchen gabapentin (NEURONTIN) 300 MG capsule Take 300 mg by mouth 3 (three) times daily.    Marland Kitchen ketorolac (TORADOL) 30 MG/ML injection Inject 30 mg into the muscle once.    Marland Kitchen L-Methylfolate (DEPLIN) 15 MG TABS Take 15 mg by mouth daily.    Marland Kitchen levocetirizine (XYZAL) 5 MG tablet Take 5 mg by mouth every evening.    . methocarbamol (ROBAXIN) 500 MG tablet Take 500 mg by mouth 3 (three) times daily.    Marland Kitchen nystatin (MYCOSTATIN/NYSTOP) 100000 UNIT/GM POWD Apply topically.    Earney Navy Bicarbonate (ZEGERID PO) Take by mouth.    . ondansetron (ZOFRAN-ODT) 4 MG disintegrating tablet Take 1-2 tablets (4-8 mg total) by mouth every 8 (eight) hours as needed for nausea or vomiting. 30 tablet 2  . predniSONE (DELTASONE) 5 MG tablet Take 5 mg by mouth daily with breakfast.    . promethazine (PHENERGAN) 12.5 MG tablet Take 12.5 mg by mouth every 6 (six) hours as needed for nausea or vomiting.    . sertraline (ZOLOFT) 100 MG tablet Take 200 mg by mouth daily.    Marland Kitchen sulfamethoxazole-trimethoprim (BACTRIM DS,SEPTRA DS) 800-160 MG tablet Take 1 tablet by mouth 2 (two) times daily. 20 tablet 0  . traMADol (ULTRAM) 50 MG tablet Take by mouth every 6 (six) hours as needed.    . traZODone (DESYREL) 50 MG tablet Take 50-150 mg by mouth at bedtime as needed.     No current  facility-administered medications on file prior to visit.    ALLERGIES: Allergies  Allergen Reactions  . Codeine Nausea And Vomiting  . Lortab [Hydrocodone-Acetaminophen] Nausea And Vomiting    FAMILY HISTORY: Family History  Problem Relation Age of Onset  . Cancer Mother     CLL, decsd in 2012    SOCIAL HISTORY: Social History   Social History  . Marital Status: Single    Spouse Pena: N/A  . Number of Children: N/A  . Years of Education: N/A   Occupational History  . Not on file.   Social History Main Topics  . Smoking status: Never Smoker   . Smokeless tobacco: Not on file  . Alcohol Use: Not on file  . Drug Use: No  . Sexual Activity: Not on file   Other Topics Concern  . Not on file   Social History Narrative   Pt has doctorate degree    REVIEW  OF SYSTEMS: Constitutional: No fevers, chills, or sweats, no generalized fatigue, change in appetite Eyes: No visual changes, double vision, eye pain Ear, nose and throat: No hearing loss, ear pain, nasal congestion, sore throat Cardiovascular: No chest pain, palpitations Respiratory:  No shortness of breath at rest or with exertion, wheezes GastrointestinaI: No nausea, vomiting, diarrhea, abdominal pain, fecal incontinence Genitourinary:  No dysuria, urinary retention or frequency Musculoskeletal:  Back, shoulder, knees Integumentary: Swelling of the feet Neurological: as above Psychiatric: depression Allergic/Immunologic: no itchy/runny eyes, nasal congestion, recent allergic reactions, rashes  PHYSICAL EXAM: Filed Vitals:   03/10/15 1309  BP: 128/70  Pulse: 98   General: No acute distress.  Head:  Normocephalic/atraumatic Eyes:  fundi unremarkable, without vessel changes, exudates, hemorrhages or papilledema. Neck: supple, no paraspinal tenderness, full range of motion Back: No paraspinal tenderness Heart: regular rate and rhythm Lungs: Clear to auscultation bilaterally. Vascular: No carotid  bruits. Neurological Exam: Mental status: alert and oriented to person, place, and time, recent and remote memory intact, fund of knowledge intact, attention and concentration intact, speech fluent and not dysarthric, language intact. Cranial nerves: CN I: not tested CN II: pupils equal, round and reactive to light, visual fields intact, fundi unremarkable, without vessel changes, exudates, hemorrhages or papilledema. CN III, IV, VI:  full range of motion, no nystagmus, no ptosis CN V: facial sensation intact CN VII: upper and lower face symmetric CN VIII: hearing intact CN IX, X: gag intact, uvula midline CN XI: sternocleidomastoid and trapezius muscles intact CN XII: tongue midline Bulk & Tone: increased tone in right upper extremity.  Decreased bulk in lower extremities. Motor:  4+/5 right bicep, tricep and grip.  5/5 left upper extremity.  5-/5 hamstrings.  Otherwise, unable to move lower extremities. Sensation:  Pinprick and vibration reduced in left lower extremity Deep Tendon Reflexes:  3+ in upper extremities, absent in lower extremities, toes equivocal Finger to nose testing:  Without dysmetria.  Heel to shin:  Unable to assess Gait:  Non-ambulatory  IMPRESSION: Chronic migraine without aura Complex regional pain syndrome with lower extremity swelling Cerebral palsy  PLAN: 1.  Will try to set her up for botox.  She has had over 15 headache days for over 3 months and has failed multiple preventatives. 2.  Advised to limit use of Fioricet.  If needed, she may take a second dose of Maxalt. 3.  Will refill Tramadol today 4.  Should discuss with Dr. Brigitte Pulse about swelling in the feet. 5.  Follow up for Botox  Thank you for allowing me to take part in the care of this patient.  Metta Clines, DO  CC:  [name], MD

## 2015-03-10 NOTE — Patient Instructions (Signed)
1.  Will set you up for Botox 2.  Take Maxalt 1 tablet at earliest onset of headache.  May repeat dose once in 2 hours if needed.  Do not exceed two tablets in 24 hours 3.  Try to avoid Fioricet if possible 4.  Follow up for first round of Botox

## 2015-03-12 ENCOUNTER — Ambulatory Visit (INDEPENDENT_AMBULATORY_CARE_PROVIDER_SITE_OTHER): Payer: BLUE CROSS/BLUE SHIELD | Admitting: Family Medicine

## 2015-03-12 VITALS — BP 120/79 | HR 92 | Temp 99.0°F | Resp 16

## 2015-03-12 DIAGNOSIS — IMO0002 Reserved for concepts with insufficient information to code with codable children: Secondary | ICD-10-CM

## 2015-03-12 DIAGNOSIS — R600 Localized edema: Secondary | ICD-10-CM

## 2015-03-12 DIAGNOSIS — G9050 Complex regional pain syndrome I, unspecified: Secondary | ICD-10-CM | POA: Diagnosis not present

## 2015-03-12 DIAGNOSIS — D8989 Other specified disorders involving the immune mechanism, not elsewhere classified: Secondary | ICD-10-CM

## 2015-03-12 DIAGNOSIS — Z993 Dependence on wheelchair: Secondary | ICD-10-CM | POA: Diagnosis not present

## 2015-03-12 DIAGNOSIS — G808 Other cerebral palsy: Secondary | ICD-10-CM

## 2015-03-12 DIAGNOSIS — G8929 Other chronic pain: Secondary | ICD-10-CM

## 2015-03-12 DIAGNOSIS — M549 Dorsalgia, unspecified: Secondary | ICD-10-CM | POA: Diagnosis not present

## 2015-03-12 DIAGNOSIS — E559 Vitamin D deficiency, unspecified: Secondary | ICD-10-CM

## 2015-03-12 DIAGNOSIS — M359 Systemic involvement of connective tissue, unspecified: Secondary | ICD-10-CM | POA: Diagnosis not present

## 2015-03-12 DIAGNOSIS — F411 Generalized anxiety disorder: Secondary | ICD-10-CM | POA: Diagnosis not present

## 2015-03-12 DIAGNOSIS — R7309 Other abnormal glucose: Secondary | ICD-10-CM | POA: Diagnosis not present

## 2015-03-12 LAB — CBC WITH DIFFERENTIAL/PLATELET
BASOS PCT: 0 % (ref 0–1)
Basophils Absolute: 0 10*3/uL (ref 0.0–0.1)
EOS ABS: 0.1 10*3/uL (ref 0.0–0.7)
EOS PCT: 1 % (ref 0–5)
HCT: 37.9 % (ref 36.0–46.0)
HEMOGLOBIN: 12.7 g/dL (ref 12.0–15.0)
Lymphocytes Relative: 21 % (ref 12–46)
Lymphs Abs: 2.1 10*3/uL (ref 0.7–4.0)
MCH: 28.9 pg (ref 26.0–34.0)
MCHC: 33.5 g/dL (ref 30.0–36.0)
MCV: 86.1 fL (ref 78.0–100.0)
MONO ABS: 0.6 10*3/uL (ref 0.1–1.0)
MPV: 9.2 fL (ref 8.6–12.4)
Monocytes Relative: 6 % (ref 3–12)
NEUTROS ABS: 7.2 10*3/uL (ref 1.7–7.7)
Neutrophils Relative %: 72 % (ref 43–77)
PLATELETS: 230 10*3/uL (ref 150–400)
RBC: 4.4 MIL/uL (ref 3.87–5.11)
RDW: 14.4 % (ref 11.5–15.5)
WBC: 10 10*3/uL (ref 4.0–10.5)

## 2015-03-12 LAB — COMPREHENSIVE METABOLIC PANEL
ALK PHOS: 65 U/L (ref 33–115)
ALT: 12 U/L (ref 6–29)
AST: 12 U/L (ref 10–30)
Albumin: 4.1 g/dL (ref 3.6–5.1)
BILIRUBIN TOTAL: 0.2 mg/dL (ref 0.2–1.2)
BUN: 9 mg/dL (ref 7–25)
CALCIUM: 9.1 mg/dL (ref 8.6–10.2)
CO2: 21 mmol/L (ref 20–31)
Chloride: 103 mmol/L (ref 98–110)
Creat: 0.44 mg/dL — ABNORMAL LOW (ref 0.50–1.10)
Glucose, Bld: 85 mg/dL (ref 65–99)
POTASSIUM: 3.6 mmol/L (ref 3.5–5.3)
Sodium: 137 mmol/L (ref 135–146)
TOTAL PROTEIN: 6.5 g/dL (ref 6.1–8.1)

## 2015-03-12 LAB — VITAMIN B12: VITAMIN B 12: 339 pg/mL (ref 211–911)

## 2015-03-12 LAB — THYROID PANEL WITH TSH
FREE THYROXINE INDEX: 2 (ref 1.4–3.8)
T3 UPTAKE: 28 % (ref 22–35)
T4 TOTAL: 7.3 ug/dL (ref 4.5–12.0)
TSH: 1.731 u[IU]/mL (ref 0.350–4.500)

## 2015-03-12 LAB — LIPID PANEL
CHOLESTEROL: 181 mg/dL (ref 125–200)
HDL: 28 mg/dL — ABNORMAL LOW (ref >=46)
LDL CALC: 103 mg/dL (ref <130)
TRIGLYCERIDES: 249 mg/dL — AB (ref <150)
Total CHOL/HDL Ratio: 6.5 Ratio — ABNORMAL HIGH (ref <=5.0)
VLDL: 50 mg/dL — ABNORMAL HIGH (ref <30)

## 2015-03-12 LAB — C-REACTIVE PROTEIN: CRP: 3.3 mg/dL — AB (ref <0.60)

## 2015-03-12 LAB — D-DIMER, QUANTITATIVE: D-Dimer, Quant: 0.27 ug/mL-FEU (ref 0.00–0.48)

## 2015-03-12 MED ORDER — FUROSEMIDE 40 MG PO TABS: 40.0000 mg | ORAL_TABLET | Freq: Every day | ORAL | Status: DC

## 2015-03-12 MED ORDER — KETOROLAC TROMETHAMINE 30 MG/ML IJ SOLN: 30.0000 mg | Freq: Once | INTRAMUSCULAR | Status: DC

## 2015-03-12 MED ORDER — KETOROLAC TROMETHAMINE 30 MG/ML IJ SOLN
30.0000 mg | Freq: Once | INTRAMUSCULAR | Status: AC
  Administered 2015-03-12: 30 mg via INTRAMUSCULAR

## 2015-03-12 MED ORDER — POTASSIUM CHLORIDE CRYS ER 20 MEQ PO TBCR: 20.0000 meq | EXTENDED_RELEASE_TABLET | Freq: Every day | ORAL | Status: DC

## 2015-03-12 MED ORDER — TRAMADOL HCL 50 MG PO TABS: 50.0000 mg | ORAL_TABLET | Freq: Four times a day (QID) | ORAL | Status: DC | PRN

## 2015-03-12 MED ORDER — HYDROMORPHONE HCL 2 MG PO TABS: 2.0000 mg | ORAL_TABLET | ORAL | Status: DC | PRN

## 2015-03-12 NOTE — Progress Notes (Addendum)
Subjective:    Patient ID: Carolyn Pena, female    DOB: 06-Apr-1975, 40 y.o.   MRN: [id number] Chief Complaint  Patient presents with  . Establish Care  . 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 She very recently moved here from [facility] which she had been her whole life to take a job at [employer].  This has been an incredibaly difficult transition as she has been unable to establish [place] medicaid (and [place] medicaid just ran out) and she has been unable to access any  In-home support services, or innoventation [name] facilitates these eligible services - was told that she would have a 10 yr wait to get the equivalent amount of services.  She is working with Venetia Constable to try to get emergency coverage for in-home ADLs but likely that would not come near the amount of assistance she requires and prev had in [place] Has had medicaid her whole life but now having a lot of trouble getting onto [facility] medicaid since she is employed. However, pt wisely points out that without the medicaid coverage that pays for in-home ADL services and DME, she would not be able to work and does not have a sufficient income to be able to pay for these services through private ins/employer [employer] so is really in a catch-22.  Unfortunately,  her case manager has not been at all helpful - seems to just be throwing up more roadblocks for her at every turn.She is having to sleeping in her chair as she can't get to her bed as she doesn't have ADL assist.  This has also exercerbating her anxiety.  10d of pedal edema. Has never had this so severe    suspectable to pna and kidney infection.   hida scan in 2006  Urine normal. Headaches still freq but hoping botox will help.  Trying to get [facility] medicaid but they are giving her a really hard time as well.    Really fatigued, bowels getting pain  Has a high pain tolerance so when she has a flair  feels like a horrible flu - extreme body ache all over though lower ext worse and then she will use the tramadol at night - this is a newer medication.  Can't tolerate the narcotics as they make sure very nausea - lots of vomiting with the hydrocodone, cold sweats.  Does tolerate the tramadol well but often can't fall asleep - the trazodone when she takes it.  High anxiety, worsening, startles easily w/ panic but much less depression so would like to try to get off the zoloft GM passed away from leukoma and gm passed away a year later and pt was primary care [place] which is when she was started on the antidepressant. It has been 4 years and so ready to come off of the zoloft, would like to stay on the buspar and the klonopin.  Had a really great psychiastrist who was really helpful.  Feels like she has goode coping mechanisms.  He had a lot of insight into how the pysch meds to causing worsening of the CP - eg. effexor causes more tonicity.  Past Medical History  Diagnosis Date  . Allergy   . Anemia   . Anxiety   . Arthritis   . Asthma   . Depression   . Neuromuscular disorder ([place])   . CP (cerebral palsy) (  [place])   . [place] multiple concussions    Past Surgical History  Procedure Laterality Date  . Eye surgery    . Joint replacement     Current Outpatient Prescriptions on File Prior to Visit  Medication Sig Dispense Refill  . albuterol (PROVENTIL HFA;VENTOLIN HFA) 108 (90 BASE) MCG/ACT inhaler Inhale 2 puffs into the lungs every 6 (six) hours as needed for wheezing or shortness of breath.    . benzonatate (TESSALON) 200 MG capsule Take 200 mg by mouth 3 (three) times daily as needed for cough.    . botulinum toxin Type A (BOTOX) 100 UNITS SOLR injection Inject 100 Units into the muscle every 3 (three) months.    . busPIRone (BUSPAR) 5 MG tablet Take 5 mg by mouth 2 (two) times daily.    . butalbital-acetaminophen-caffeine (FIORICET, ESGIC) 50-325-40 MG tablet Take 1 tablet by mouth 2 (two)  times daily as needed for headache.    . clonazePAM (KLONOPIN) 1 MG tablet Take 1 mg by mouth 2 (two) times daily.    . diclofenac (VOLTAREN) 50 MG EC tablet Take 50 mg by mouth 2 (two) times daily.    Marland Kitchen esomeprazole (NEXIUM) 40 MG capsule Take 1 capsule (40 mg total) by mouth daily at 12 noon. 30 capsule 11  . etonogestrel (NEXPLANON) 68 MG IMPL implant 1 each by Subdermal route once.    . Fluticasone-Salmeterol (ADVAIR) 100-50 MCG/DOSE AEPB Inhale 1 puff into the lungs 2 (two) times daily.    Marland Kitchen gabapentin (NEURONTIN) 300 MG capsule Take 300 mg by mouth 3 (three) times daily.    Marland Kitchen L-Methylfolate (DEPLIN) 15 MG TABS Take 15 mg by mouth daily.    Marland Kitchen levocetirizine (XYZAL) 5 MG tablet Take 5 mg by mouth every evening.    . methocarbamol (ROBAXIN) 500 MG tablet Take 500 mg by mouth 3 (three) times daily.    Marland Kitchen nystatin (MYCOSTATIN/NYSTOP) 100000 UNIT/GM POWD Apply topically.    Earney Navy Bicarbonate (ZEGERID PO) Take by mouth.    . ondansetron (ZOFRAN-ODT) 4 MG disintegrating tablet Take 1-2 tablets (4-8 mg total) by mouth every 8 (eight) hours as needed for nausea or vomiting. 30 tablet 2  . predniSONE (DELTASONE) 5 MG tablet Take 5 mg by mouth daily with breakfast.    . promethazine (PHENERGAN) 12.5 MG tablet Take 12.5 mg by mouth every 6 (six) hours as needed for nausea or vomiting.    . sertraline (ZOLOFT) 100 MG tablet Take 200 mg by mouth daily.    . traZODone (DESYREL) 50 MG tablet Take 1-3 tablets (50-150 mg total) by mouth at bedtime as needed. 30 tablet 3   No current facility-administered medications on file prior to visit.   Allergies  Allergen Reactions  . Codeine Nausea And Vomiting  . Lortab [Hydrocodone-Acetaminophen] Nausea And Vomiting   Family History  Problem Relation Age of Onset  . Cancer Mother     CLL, decsd in 2012   Social History   Social History  . Marital Status: Single    Spouse Name: N/A  . Number of Children: N/A  . Years of Education: N/A    Social History Main Topics  . Smoking status: Never Smoker   . Smokeless tobacco: Not on file  . Alcohol Use: Not on file  . Drug Use: No  . Sexual Activity: Not on file   Other Topics Concern  . Not on file   Social History Narrative   Pt has doctorate degree    Review  of Systems  Constitutional: Positive for activity change and fatigue. Negative for fever, chills and unexpected weight change.  Respiratory: Negative for cough, chest tightness and shortness of breath.   Cardiovascular: Positive for leg swelling. Negative for chest pain and palpitations.  Genitourinary: Negative for dysuria, urgency, frequency, decreased urine volume, enuresis and difficulty urinating.  Musculoskeletal: Positive for myalgias, back pain, joint swelling, arthralgias and gait problem.  Neurological: Positive for weakness, numbness and headaches. Negative for seizures and speech difficulty.  Psychiatric/Behavioral: Positive for sleep disturbance. Negative for dysphoric mood and agitation. The patient is nervous/anxious.        Objective:  BP 120/79 mmHg  Pulse 92  Temp(Src) 99 F (37.2 C)  Resp 16  Wt   LMP 01/31/2015  Physical Exam  Constitutional: She is oriented to person, place, and time. She appears well-nourished. No distress.  HENT:  Head: Normocephalic and atraumatic.  Right Ear: External ear normal.  Left Ear: External ear normal.  Eyes: Conjunctivae are normal. No scleral icterus.  Cardiovascular: Normal rate, regular rhythm, normal heart sounds and intact distal pulses.   Pulmonary/Chest: Effort normal and breath sounds normal. No respiratory distress.  Musculoskeletal: She exhibits no edema.       Lumbar back: She exhibits decreased range of motion and tenderness. She exhibits no swelling.  3+ pitting edema to knee bilaterally  Neurological: She is alert and oriented to person, place, and time. She displays atrophy. A sensory deficit is present. She exhibits abnormal muscle  tone.  b lower ext with muscle atrophy, strength in bilateral lower ext 0/5 with ankle and toe contractures Completley non-ambulatory   Skin: Skin is warm and dry. No rash noted. She is not diaphoretic. No erythema.  Psychiatric: She has a normal mood and affect. Her behavior is normal.    3+ pitting edema in left lower ext below knee.    Assessment & Plan:  Call if you need a refill on anything  Call in the tramadol  1. Pedal edema - start lasix w/ K bid x 3d - change lasix to prn after edema resolves. dvt unlikely but pt knows still a possibility so will do d-dimer.  Buy compression socks - unfortunately, it is unlikely that she will be able to get them on herself and hasn't be able to get the in-home services for ADLs such this as she had in [place] to help with this - will have a co-worker help her daily for now.  Elevate legs as much as possible - can recline chair to about 70 deg to sleep but cannot change 90 deg hip and knee angles at all so edema likely due to venous stasis/impaired venous return from being in the same position for about a mo. Also need home health to help monitor/prevent (by aiding in position change) skin ulcerations/pressure sores.  2. 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 Complex regional pain syndrome - established with Dr. Granville Lewis neuro.  4. Vitamin D deficiency   5. Elevated glucose   6.      Exacerbation of chronic back/body pain - pt is usually able to be maintained on robaxin and gabapentin with prn diclofenac or tramadol qhs .  Reports IM toradol 81m (self-administered) and hydromorphone provide the best relief. Aware of side effects/risks of thses meds and med interactions so tries to only use sparingly. She cannot tol any  of the -codones - "a light weight." 7.      Anxiety - cont xanax and buspar. Wait for several mos until starting to wean off zoloft 246m -  bad timing to start taper now due to so many current stressors. 8    Migraines - has referral for botox from her neurologist [facility], was advised to use fioricet sparingly.  Continuing but tolerable currently   Orders Placed This Encounter  Procedures  . Lipid panel    Order Specific Question:  Has the patient fasted?    Answer:  Yes  . Comprehensive metabolic panel    Order Specific Question:  Has the patient fasted?    Answer:  Yes  . CBC with Differential/Platelet  . Sedimentation Rate  . C-reactive protein  . D[facility]dimer, quantitative ([facility]  . VITAMIN D 25 Hydroxy (Vit-D Deficiency, Fractures)  . Brain natriuretic peptide  . Thyroid Panel With TSH  . Vitamin B12  . Hemoglobin A1c    Meds ordered this encounter  Medications  . furosemide (LASIX) 40 MG tablet    Sig: Take 1 tablet (40 mg total) by mouth daily.    Dispense:  30 tablet    Refill:  3  . potassium chloride SA (K-DUR,KLOR-CON) 20 MEQ tablet    Sig: Take 1 tablet (20 mEq total) by mouth daily.    Dispense:  30 tablet    Refill:  3  . traMADol (ULTRAM) 50 MG tablet    Sig: Take 1 tablet (50 mg total) by mouth every 6 (six) hours as needed.    Dispense:  120 tablet    Refill:  0  . ketorolac (TORADOL) 30 MG/ML injection    Sig: Inject 1 mL (30 mg total) into the muscle once.    Dispense:  10 mL    Refill:  2  . HYDROmorphone (DILAUDID) 2 MG tablet    Sig: Take 1 tablet (2 mg total) by mouth every 4 (four) hours as needed for severe pain.    Dispense:  30 tablet    Refill:  0  . ketorolac (TORADOL) 30 MG/ML injection 30 mg    Sig:     Over 40 min spent in face-to-face evaluation of and consultation with patient and coordination of care.  Over 50% of this time was spent counseling this patient.  EDelman Cheadle MD MPH

## 2015-03-13 LAB — BRAIN NATRIURETIC PEPTIDE: Brain Natriuretic Peptide: 32 pg/mL (ref 0.0–100.0)

## 2015-03-13 LAB — HEMOGLOBIN A1C
HEMOGLOBIN A1C: 5.2 % (ref <5.7)
Mean Plasma Glucose: 103 mg/dL (ref <117)

## 2015-03-13 LAB — SEDIMENTATION RATE: SED RATE: 11 mm/h (ref 0–20)

## 2015-03-13 LAB — VITAMIN D 25 HYDROXY (VIT D DEFICIENCY, FRACTURES): Vit D, 25-Hydroxy: 9 ng/mL — ABNORMAL LOW (ref 30–100)

## 2015-03-16 ENCOUNTER — Telehealth: Payer: Self-pay | Admitting: General Practice

## 2015-03-16 ENCOUNTER — Telehealth: Payer: Self-pay

## 2015-03-16 NOTE — Telephone Encounter (Signed)
error 

## 2015-03-16 NOTE — Telephone Encounter (Signed)
[facility] has been trying to contact the patient regarding the [place] referral that was placed on 03/13/15.  They have been unsuccessful so far in reaching her, and she only has one phone number on file.  Please let her know to contact [facility] at [phone] if she contacts our office.  CB# for patient: [phone]

## 2015-03-19 NOTE — Telephone Encounter (Signed)
Called number on file. Phone disconnected

## 2015-03-20 NOTE — Telephone Encounter (Signed)
Sent unable to reach letter to home address.

## 2015-03-31 ENCOUNTER — Telehealth: Payer: Self-pay

## 2015-03-31 NOTE — Telephone Encounter (Signed)
lmom to cb to see what questions she have.  Dr. Brigitte Pulse can you review results

## 2015-03-31 NOTE — Telephone Encounter (Signed)
Patient wants to talk to Dr. Brigitte Pulse or Jonelle Sidle in regards to her blood work. She states she has a couple of question about her labs and her prescription.

## 2015-04-01 ENCOUNTER — Other Ambulatory Visit: Payer: Self-pay | Admitting: Family Medicine

## 2015-04-01 MED ORDER — ERGOCALCIFEROL 1.25 MG (50000 UT) PO CAPS: 50000.0000 [IU] | ORAL_CAPSULE | ORAL | Status: DC

## 2015-04-01 NOTE — Telephone Encounter (Signed)
Done, please see what she needs as far as medication.

## 2015-04-03 NOTE — Telephone Encounter (Signed)
Informed pt of lab results.  The edema has improved some, but it is still a problem for her. Pt wants to know if she should continue the potassium and lasix. She stopped taking them once the swelling went down, but the swelling has continued; just not as severe. She just wanted to make sure she didn't take too much. Should be go back on potassium and lasix?

## 2015-04-03 NOTE — Telephone Encounter (Signed)
Called pt and LVM advising her to try to take the lasix either 1/2 tab a day or every other day.  She can use it as needed - take it daily until swelling gone and then every other day or every 3rd day to keep it away.   Has she gotten help at home yet?  How is it going with her medicaid?

## 2015-04-06 ENCOUNTER — Encounter: Payer: Self-pay | Admitting: Family Medicine

## 2015-04-08 ENCOUNTER — Telehealth: Payer: Self-pay

## 2015-04-08 NOTE — Telephone Encounter (Signed)
PT called to see if Mychart message or fax for ADA placard had been received. Pt would like call back once this form has been completed. Shared with pt receipt of my chart message and located form in PCP's mailbox by VM outgoing call to work line.  [phone] W// [phone] C

## 2015-04-08 NOTE — Telephone Encounter (Signed)
We have been very busy so didn't have time to check the box yesterday. Form now completed and placed in fax pile. Please let pt know.

## 2015-04-14 NOTE — Telephone Encounter (Signed)
Patient picked up [facility] paperwork. Pt wants to cancel appt this week with Dr. Brigitte Pulse. Spoke with front staff - appt cancelled.

## 2015-04-16 ENCOUNTER — Ambulatory Visit: Payer: BLUE CROSS/BLUE SHIELD | Admitting: Family Medicine

## 2015-05-05 ENCOUNTER — Encounter: Payer: Self-pay | Admitting: Family Medicine

## 2015-05-06 ENCOUNTER — Encounter: Payer: Self-pay | Admitting: Family Medicine

## 2015-05-08 MED ORDER — ALBUTEROL SULFATE (2.5 MG/3ML) 0.083% IN NEBU: 2.5000 mg | INHALATION_SOLUTION | Freq: Four times a day (QID) | RESPIRATORY_TRACT | Status: DC | PRN

## 2015-05-08 MED ORDER — ALBUTEROL SULFATE HFA 108 (90 BASE) MCG/ACT IN AERS: 2.0000 | INHALATION_SPRAY | Freq: Four times a day (QID) | RESPIRATORY_TRACT | Status: DC | PRN

## 2015-05-08 MED ORDER — BENZONATATE 200 MG PO CAPS: 200.0000 mg | ORAL_CAPSULE | Freq: Three times a day (TID) | ORAL | Status: DC | PRN

## 2015-05-08 MED ORDER — FLUTICASONE-SALMETEROL 100-50 MCG/DOSE IN AEPB: 1.0000 | INHALATION_SPRAY | Freq: Two times a day (BID) | RESPIRATORY_TRACT | Status: DC

## 2015-05-23 ENCOUNTER — Ambulatory Visit (INDEPENDENT_AMBULATORY_CARE_PROVIDER_SITE_OTHER): Payer: BLUE CROSS/BLUE SHIELD | Admitting: Family Medicine

## 2015-05-23 VITALS — BP 110/78 | HR 105 | Temp 99.0°F | Resp 16

## 2015-05-23 DIAGNOSIS — R509 Fever, unspecified: Secondary | ICD-10-CM

## 2015-05-23 DIAGNOSIS — G43909 Migraine, unspecified, not intractable, without status migrainosus: Secondary | ICD-10-CM

## 2015-05-23 DIAGNOSIS — B349 Viral infection, unspecified: Secondary | ICD-10-CM | POA: Diagnosis not present

## 2015-05-23 DIAGNOSIS — E860 Dehydration: Secondary | ICD-10-CM

## 2015-05-23 LAB — POCT URINALYSIS DIP (MANUAL ENTRY)
BILIRUBIN UA: NEGATIVE
BILIRUBIN UA: NEGATIVE
GLUCOSE UA: NEGATIVE
Nitrite, UA: NEGATIVE
Protein Ur, POC: NEGATIVE
SPEC GRAV UA: 1.025
UROBILINOGEN UA: 0.2
pH, UA: 5.5

## 2015-05-23 LAB — POC MICROSCOPIC URINALYSIS (UMFC)

## 2015-05-23 LAB — POCT INFLUENZA A/B
INFLUENZA A, POC: NEGATIVE
INFLUENZA B, POC: NEGATIVE

## 2015-05-23 MED ORDER — AZITHROMYCIN 250 MG PO TABS: ORAL_TABLET | ORAL | Status: DC

## 2015-05-23 MED ORDER — KETOROLAC TROMETHAMINE 30 MG/ML IJ SOLN
30.0000 mg | Freq: Once | INTRAMUSCULAR | Status: AC
  Administered 2015-05-23: 30 mg via INTRAMUSCULAR

## 2015-05-23 MED ORDER — METHYLPREDNISOLONE ACETATE 80 MG/ML IJ SUSP
120.0000 mg | Freq: Once | INTRAMUSCULAR | Status: AC
  Administered 2015-05-23: 120 mg via INTRAMUSCULAR

## 2015-05-23 NOTE — Progress Notes (Addendum)
Subjective:  By signing my name below, I, Carolyn Pena, attest that this documentation has been prepared under the direction and in the presence of Carolyn Cheadle, MD.  Electronically Signed: Thea Pena, ED Scribe. 05/23/2015. 9:43 AM.  Patient ID: Carolyn Pena, female    DOB: 17-Feb-1976, 40 y.o.   MRN: [id number]  HPI   Chief Complaint  Patient presents with  . Headache    Symptoms began a week ago   . Fever  . Nausea   HPI Comments: Carolyn Pena is a 40 y.o. female who presents to the [facility] complaining of body aches and chills. Pt states symptoms started 1 week ago. She reports symptoms worsened this week with sore throat 2 days ago followed by fever, chills, body aches, sweats, ear pressure, diarrhea, and nausea yesterday. Her caretaker mentions pt's urine having a strong odor. She has been taking tessalon and dayquil without relief to symptoms. She has missed work due to symptoms. She denies emesis. She did not receive flu shot this year. She was on cipro and bactrum fror UTI 3 months ago.   Pt reports improvement with leg swelling but does note tenderness to left leg feet. She takes lasix on weekends.   Pt is having difficulty obtaining medical equipment. [facility]  Patient Active Problem List   Diagnosis Date Noted  . Chronic migraine without aura without status migrainosus, not intractable 03/10/2015  . Complex regional pain syndrome 02/28/2015  . Anxiety state 02/28/2015  . Migraine 02/28/2015  . Asthma 02/28/2015  . Autoimmune disorder ([place]) 02/28/2015  . Cerebral palsy, hemiplegic ([place]) 02/28/2015  . Wheelchair dependent 02/28/2015  . Chronic back pain 02/28/2015  . GERD (gastroesophageal reflux disease) 02/28/2015  . Insomnia 02/28/2015   Past Medical History  Diagnosis Date  . Allergy   . Anemia   . Anxiety   . Arthritis   . Asthma   . Depression   . Neuromuscular disorder ([place])     . CP (cerebral palsy) ([place])   . H/O multiple concussions    Past Surgical History  Procedure Laterality Date  . Eye surgery    . Joint replacement     Allergies  Allergen Reactions  . Codeine Nausea And Vomiting  . Lortab [Hydrocodone-Acetaminophen] Nausea And Vomiting   Prior to Admission medications   Medication Sig Start Date End Date Taking? Authorizing Provider  albuterol (PROVENTIL HFA;VENTOLIN HFA) 108 (90 Base) MCG/ACT inhaler Inhale 2 puffs into the lungs every 6 (six) hours as needed for wheezing or shortness of breath. 05/08/15  Yes Shawnee Knapp, MD  albuterol (PROVENTIL) (2.5 MG/3ML) 0.083% nebulizer solution Take 3 mLs (2.5 mg total) by nebulization every 6 (six) hours as needed for wheezing or shortness of breath. 05/08/15  Yes Shawnee Knapp, MD  benzonatate (TESSALON) 200 MG capsule Take 1 capsule (200 mg total) by mouth 3 (three) times daily as needed for cough. 05/08/15  Yes Shawnee Knapp, MD  botulinum toxin Type A (BOTOX) 100 UNITS SOLR injection Inject 100 Units into the muscle every 3 (three) months.   Yes Historical Provider, MD  busPIRone (BUSPAR) 5 MG tablet Take 5 mg by mouth 2 (two) times daily.   Yes Historical Provider, MD  butalbital-acetaminophen-caffeine (FIORICET, ESGIC) 50-325-40 MG tablet Take 1 tablet by mouth 2 (two) times daily as needed for headache.   Yes Historical Provider, MD  clonazePAM (KLONOPIN) 1 MG tablet Take 1  mg by mouth 2 (two) times daily.   Yes Historical Provider, MD  diclofenac (VOLTAREN) 50 MG EC tablet Take 50 mg by mouth 2 (two) times daily.   Yes Historical Provider, MD  ergocalciferol (VITAMIN D2) 50000 units capsule Take 1 capsule (50,000 Units total) by mouth once a week. 04/01/15  Yes Shawnee Knapp, MD  esomeprazole (NEXIUM) 40 MG capsule Take 1 capsule (40 mg total) by mouth daily at 12 noon. 02/18/15  Yes Shawnee Knapp, MD  etonogestrel (NEXPLANON) 68 MG IMPL implant 1 each by Subdermal route once.   Yes Historical Provider, MD   Fluticasone-Salmeterol (ADVAIR) 100-50 MCG/DOSE AEPB Inhale 1 puff into the lungs 2 (two) times daily. 05/08/15  Yes Shawnee Knapp, MD  furosemide (LASIX) 40 MG tablet Take 1 tablet (40 mg total) by mouth daily. 03/12/15  Yes Shawnee Knapp, MD  gabapentin (NEURONTIN) 300 MG capsule Take 300 mg by mouth 3 (three) times daily.   Yes Historical Provider, MD  HYDROmorphone (DILAUDID) 2 MG tablet Take 1 tablet (2 mg total) by mouth every 4 (four) hours as needed for severe pain. 03/12/15  Yes Shawnee Knapp, MD  ketorolac (TORADOL) 30 MG/ML injection Inject 1 mL (30 mg total) into the muscle once. 03/12/15  Yes Shawnee Knapp, MD  L-Methylfolate (DEPLIN) 15 MG TABS Take 15 mg by mouth daily.   Yes Historical Provider, MD  levocetirizine (XYZAL) 5 MG tablet Take 5 mg by mouth every evening.   Yes Historical Provider, MD  methocarbamol (ROBAXIN) 500 MG tablet Take 500 mg by mouth 3 (three) times daily.   Yes Historical Provider, MD  nystatin (MYCOSTATIN/NYSTOP) 100000 UNIT/GM POWD Apply topically.   Yes Historical Provider, MD  Omeprazole-Sodium Bicarbonate (ZEGERID PO) Take by mouth.   Yes Historical Provider, MD  ondansetron (ZOFRAN-ODT) 4 MG disintegrating tablet Take 1-2 tablets (4-8 mg total) by mouth every 8 (eight) hours as needed for nausea or vomiting. 02/18/15  Yes Shawnee Knapp, MD  potassium chloride SA (K-DUR,KLOR-CON) 20 MEQ tablet Take 1 tablet (20 mEq total) by mouth daily. 03/12/15  Yes Shawnee Knapp, MD  predniSONE (DELTASONE) 5 MG tablet Take 5 mg by mouth daily with breakfast.   Yes Historical Provider, MD  promethazine (PHENERGAN) 12.5 MG tablet Take 12.5 mg by mouth every 6 (six) hours as needed for nausea or vomiting.   Yes Historical Provider, MD  sertraline (ZOLOFT) 100 MG tablet Take 200 mg by mouth daily.   Yes Historical Provider, MD  traMADol (ULTRAM) 50 MG tablet Take 1 tablet (50 mg total) by mouth every 6 (six) hours as needed. 03/12/15  Yes Shawnee Knapp, MD  traZODone (DESYREL) 50 MG tablet Take 1-3  tablets (50-150 mg total) by mouth at bedtime as needed. 03/10/15  Yes Pieter Partridge, DO   Social History   Social History  . Marital Status: Single    Spouse Name: N/A  . Number of Children: N/A  . Years of Education: N/A   Occupational History  . Not on file.   Social History Main Topics  . Smoking status: Never Smoker   . Smokeless tobacco: Not on file  . Alcohol Use: Not on file  . Drug Use: No  . Sexual Activity: Not on file   Other Topics Concern  . Not on file   Social History Narrative   Pt has doctorate degree   Review of Systems  Constitutional: Positive for fever, chills, diaphoresis, activity change, appetite change  and fatigue. Negative for unexpected weight change.  HENT: Positive for congestion, sinus pressure and sore throat.   Respiratory: Negative for cough and shortness of breath.   Cardiovascular: Positive for leg swelling. Negative for chest pain and palpitations.  Gastrointestinal: Positive for nausea and diarrhea. Negative for vomiting.  Musculoskeletal: Positive for myalgias, back pain, joint swelling, arthralgias and gait problem.  Allergic/Immunologic: Negative for immunocompromised state.  Neurological: Positive for headaches.  Hematological: Negative for adenopathy.  Psychiatric/Behavioral: Negative for sleep disturbance.   Objective:   Physical Exam  Constitutional: She is oriented to person, place, and time. She appears well-developed and well-nourished. No distress.  HENT:  Head: Normocephalic and atraumatic.  Right Ear: Hearing, tympanic membrane, external ear and ear canal normal.  Left Ear: Hearing, tympanic membrane, external ear and ear canal normal.  Nose: Nose normal.  Mouth/Throat: Uvula is midline, oropharynx is clear and moist and mucous membranes are normal.  Eyes: Conjunctivae and EOM are normal.  Neck: Neck supple.  Cardiovascular: Normal rate.   Pulmonary/Chest: Effort normal.  Abdominal: Soft. Bowel sounds are normal.  There is no tenderness.  Musculoskeletal: Normal range of motion.  No warmth. Cap refill < 2 sec.   Lymphadenopathy:    She has no cervical adenopathy.  Neurological: She is alert and oriented to person, place, and time.  Skin: Skin is warm and dry.  Diaphoretic and clammy  Psychiatric: She has a normal mood and affect. Her behavior is normal.  Nursing note and vitals reviewed.  Filed Vitals:   05/23/15 0858  BP: 110/78  Pulse: 105  Temp: 99 F (37.2 C)  TempSrc: Oral  Resp: 16  SpO2: 98%    Results for orders placed or performed in visit on 05/23/15  POCT urinalysis dipstick  Result Value Ref Range   Color, UA yellow yellow   Clarity, UA cloudy (A) clear   Glucose, UA negative negative   Bilirubin, UA negative negative   Ketones, POC UA negative negative   Spec Grav, UA 1.025    Blood, UA Pena (A) negative   pH, UA 5.5    Protein Ur, POC negative negative   Urobilinogen, UA 0.2    Nitrite, UA Negative Negative   Leukocytes, UA Trace (A) Negative  POCT Microscopic Urinalysis ([facility])  Result Value Ref Range   WBC,UR,HPF,POC Few (A) None WBC/hpf   RBC,UR,HPF,POC Few (A) None RBC/hpf   Bacteria Few (A) None, Too numerous to count   Mucus Present (A) Absent   Epithelial Cells, UR Per Microscopy Moderate (A) None, Too numerous to count cells/hpf  POCT Influenza A/B  Result Value Ref Range   Influenza A, POC Negative Negative   Influenza B, POC Negative Negative   Assessment & Plan:   1. Fever, unspecified   2. Dehydration   3. Migraine without status migrainosus, not intractable, unspecified migraine type   4. Acute viral syndrome   Pt reassured that likely viral but snap rx given for zpack in case signs of bacterial infection develop  Orders Placed This Encounter  Procedures  . POCT urinalysis dipstick  . POCT Microscopic Urinalysis ([facility])  . POCT Influenza A/B    Meds ordered this encounter  Medications  . ketorolac (TORADOL) 30 MG/ML injection 30 mg     Sig:   . methylPREDNISolone acetate (DEPO-MEDROL) injection 120 mg    Sig:   . azithromycin (ZITHROMAX) 250 MG tablet    Sig: Take 2 tabs PO x 1 dose, then 1 tab PO QD x  4 days    Dispense:  6 tablet    Refill:  0    I personally performed the services described in this documentation, which was scribed in my presence. The recorded information has been reviewed and considered, and addended by me as needed.  Carolyn Cheadle, MD MPH

## 2015-05-23 NOTE — Patient Instructions (Signed)
Dehydration, Adult Dehydration is a condition in which you do not have enough fluid or water in your body. It happens when you take in less fluid than you lose. Vital organs such as the kidneys, brain, and heart cannot function without a proper amount of fluids. Any loss of fluids from the body can cause dehydration.  Dehydration can range from mild to severe. This condition should be treated right away to help prevent it from becoming severe. CAUSES  This condition may be caused by:  Vomiting.  Diarrhea.  Excessive sweating, such as when exercising in hot or humid weather.  Not drinking enough fluid during strenuous exercise or during an illness.  Excessive urine output.  Fever.  Certain medicines. RISK FACTORS This condition is more likely to develop in:  People who are taking certain medicines that cause the body to lose excess fluid (diuretics).   People who have a chronic illness, such as diabetes, that may increase urination.  Older adults.   People who live at high altitudes.   People who participate in endurance sports.  SYMPTOMS  Mild Dehydration  Thirst.  Dry lips.  Slightly dry mouth.  Dry, warm skin. Moderate Dehydration  Very dry mouth.   Muscle cramps.   Dark urine and decreased urine production.   Decreased tear production.   Headache.   Light-headedness, especially when you stand up from a sitting position.  Severe Dehydration  Changes in skin.   Cold and clammy skin.   Skin does not spring back quickly when lightly pinched and released.   Changes in body fluids.   Extreme thirst.   No tears.   Not able to sweat when body temperature is high, such as in hot weather.   Minimal urine production.   Changes in vital signs.   Rapid, weak pulse (more than 100 beats per minute when you are sitting still).   Rapid breathing.   Low blood pressure.   Other changes.   Sunken eyes.   Cold hands and feet.    Confusion.  Lethargy and difficulty being awakened.  Fainting (syncope).   Short-term weight loss.   Unconsciousness. DIAGNOSIS  This condition may be diagnosed based on your symptoms. You may also have tests to determine how severe your dehydration is. These tests may include:   Urine tests.   Blood tests.  TREATMENT  Treatment for this condition depends on the severity. Mild or moderate dehydration can often be treated at home. Treatment should be started right away. Do not wait until dehydration becomes severe. Severe dehydration needs to be treated at the hospital. Treatment for Mild Dehydration  Drinking plenty of water to replace the fluid you have lost.   Replacing minerals in your blood (electrolytes) that you may have lost.  Treatment for Moderate Dehydration  Consuming oral rehydration solution (ORS). Treatment for Severe Dehydration  Receiving fluid through an IV tube.   Receiving electrolyte solution through a feeding tube that is passed through your nose and into your stomach (nasogastric tube or NG tube).  Correcting any abnormalities in electrolytes. HOME CARE INSTRUCTIONS   Drink enough fluid to keep your urine clear or pale yellow.   Drink water or fluid slowly by taking small sips. You can also try sucking on ice cubes.  Have food or beverages that contain electrolytes. Examples include bananas and sports drinks.  Take over-the-counter and prescription medicines only as told by your health care provider.   Prepare ORS according to the manufacturer's instructions. Take sips  of ORS every 5 minutes until your urine returns to normal.  If you have vomiting or diarrhea, continue to try to drink water, ORS, or both.   If you have diarrhea, avoid:   Beverages that contain caffeine.   Fruit juice.   Milk.   Carbonated soft drinks.  Do not take salt tablets. This can lead to the condition of having too much sodium in your body  (hypernatremia).  SEEK MEDICAL CARE IF:  You cannot eat or drink without vomiting.  You have had moderate diarrhea during a period of more than 24 hours.  You have a fever. SEEK IMMEDIATE MEDICAL CARE IF:   You have extreme thirst.  You have severe diarrhea.  You have not urinated in 6-8 hours, or you have urinated only a small amount of very dark urine.  You have shriveled skin.  You are dizzy, confused, or both.   This information is not intended to replace advice given to you by your health care provider. Make sure you discuss any questions you have with your health care provider.   Document Released: 02/14/2005 Document Revised: 11/05/2014 Document Reviewed: 07/02/2014 Elsevier Interactive Patient Education 2016 Else[employer] Inc[employer]  Influenza, Adult Influenza ("the flu") is a viral infection of the respiratory tract. It occurs more often in winter months because people spend more time in close contact with one another. Influenza can make you feel very sick. Influenza easily spreads from person to person (contagious). CAUSES  Influenza is caused by a virus that infects the respiratory tract. You can catch the virus by breathing in droplets from an infected person's cough or sneeze. You can also catch the virus by touching something that was recently contaminated with the virus and then touching your mouth, nose, or eyes. RISKS AND COMPLICATIONS You may be at risk for a more severe case of influenza if you smoke cigarettes, have diabetes, have chronic heart disease (such as heart failure) or lung disease (such as asthma), or if you have a weakened immune system. Elderly people and pregnant women are also at risk for more serious infections. The most common problem of influenza is a lung infection (pneumonia). Sometimes, this problem can require emergency medical care and may be life threatening. SIGNS AND SYMPTOMS  Symptoms typically last 4 to 10 days and may  include:  Fever.  Chills.  Headache, body aches, and muscle aches.  Sore throat.  Chest discomfort and cough.  Poor appetite.  Weakness or feeling tired.  Dizziness.  Nausea or vomiting. DIAGNOSIS  Diagnosis of influenza is often made based on your history and a physical exam. A nose or throat swab test can be done to confirm the diagnosis. TREATMENT  In mild cases, influenza goes away on its own. Treatment is directed at relieving symptoms. For more severe cases, your health care provider may prescribe antiviral medicines to shorten the sickness. Antibiotic medicines are not effective because the infection is caused by a virus, not by bacteria. HOME CARE INSTRUCTIONS  Take medicines only as directed by your health care provider.  Use a cool mist humidifier to make breathing easier.  Get plenty of rest until your temperature returns to normal. This usually takes 3 to 4 days.  Drink enough fluid to keep your urine clear or pale yellow.  Cover yourmouth and nosewhen coughing or sneezing,and wash your handswellto prevent thevirusfrom spreading.  Stay homefromwork orschool untilthe fever is gonefor at least 65fll day. PREVENTION  An annual influenza vaccination (flu shot) is the best  way to avoid getting influenza. An annual flu shot is now routinely recommended for all adults in the [place] IF:  You experiencechest pain, yourcough worsens,or you producemore mucus.  Youhave nausea,vomiting, ordiarrhea.  Your fever returns or gets worse. SEEK IMMEDIATE MEDICAL CARE IF:  You havetrouble breathing, you become short of breath,or your skin ornails becomebluish.  You have severe painor stiffnessin the neck.  You develop a sudden headache, or pain in the face or ear.  You have nausea or vomiting that you cannot control. MAKE SURE YOU:   Understand these instructions.  Will watch your condition.  Will get help right away if you  are not doing well or get worse.   This information is not intended to replace advice given to you by your health care provider. Make sure you discuss any questions you have with your health care provider.   Document Released: 02/12/2000 Document Revised: 03/07/2014 Document Reviewed: 05/16/2011 Elsevier Interactive Patient Education [employer].

## 2015-05-26 ENCOUNTER — Telehealth: Payer: Self-pay | Admitting: Family Medicine

## 2015-05-26 NOTE — Telephone Encounter (Signed)
PT would like call back regarding claim rejection/// please call  [phone]

## 2015-05-26 NOTE — Telephone Encounter (Signed)
Pt called regarding billing questions/// she would like a call back please   [phone]

## 2015-05-28 ENCOUNTER — Encounter: Payer: Self-pay | Admitting: Family Medicine

## 2015-06-03 NOTE — Telephone Encounter (Signed)
I have already been in touch with patient about her account.  Fixed check in issues and resubmitted to insurance for payment by mailing claims to [facility] and [place].

## 2015-06-08 ENCOUNTER — Encounter: Payer: Self-pay | Admitting: Family Medicine

## 2015-06-09 ENCOUNTER — Emergency Department (HOSPITAL_COMMUNITY)
Admission: EM | Admit: 2015-06-09 | Discharge: 2015-06-10 | Disposition: A | Discharge status: Home / Self Care | Payer: Worker's Compensation | Attending: Emergency Medicine | Admitting: Emergency Medicine

## 2015-06-09 ENCOUNTER — Emergency Department (HOSPITAL_COMMUNITY): Payer: Worker's Compensation

## 2015-06-09 ENCOUNTER — Encounter (HOSPITAL_COMMUNITY): Payer: Self-pay | Admitting: *Deleted

## 2015-06-09 DIAGNOSIS — Z87828 Personal history of other (healed) physical injury and trauma: Secondary | ICD-10-CM | POA: Insufficient documentation

## 2015-06-09 DIAGNOSIS — F419 Anxiety disorder, unspecified: Secondary | ICD-10-CM | POA: Diagnosis not present

## 2015-06-09 DIAGNOSIS — Y998 Other external cause status: Secondary | ICD-10-CM | POA: Diagnosis not present

## 2015-06-09 DIAGNOSIS — S4991XA Unspecified injury of right shoulder and upper arm, initial encounter: Secondary | ICD-10-CM | POA: Diagnosis not present

## 2015-06-09 DIAGNOSIS — F329 Major depressive disorder, single episode, unspecified: Secondary | ICD-10-CM | POA: Diagnosis not present

## 2015-06-09 DIAGNOSIS — S0181XA Laceration without foreign body of other part of head, initial encounter: Secondary | ICD-10-CM | POA: Diagnosis not present

## 2015-06-09 DIAGNOSIS — W19XXXA Unspecified fall, initial encounter: Secondary | ICD-10-CM

## 2015-06-09 DIAGNOSIS — Y9389 Activity, other specified: Secondary | ICD-10-CM | POA: Insufficient documentation

## 2015-06-09 DIAGNOSIS — S0990XA Unspecified injury of head, initial encounter: Secondary | ICD-10-CM | POA: Insufficient documentation

## 2015-06-09 DIAGNOSIS — Z79899 Other long term (current) drug therapy: Secondary | ICD-10-CM | POA: Diagnosis not present

## 2015-06-09 DIAGNOSIS — Z8669 Personal history of other diseases of the nervous system and sense organs: Secondary | ICD-10-CM | POA: Insufficient documentation

## 2015-06-09 DIAGNOSIS — J45909 Unspecified asthma, uncomplicated: Secondary | ICD-10-CM | POA: Diagnosis not present

## 2015-06-09 DIAGNOSIS — Z7952 Long term (current) use of systemic steroids: Secondary | ICD-10-CM | POA: Insufficient documentation

## 2015-06-09 DIAGNOSIS — M1990 Unspecified osteoarthritis, unspecified site: Secondary | ICD-10-CM | POA: Insufficient documentation

## 2015-06-09 DIAGNOSIS — Z7951 Long term (current) use of inhaled steroids: Secondary | ICD-10-CM | POA: Diagnosis not present

## 2015-06-09 DIAGNOSIS — Y9289 Other specified places as the place of occurrence of the external cause: Secondary | ICD-10-CM | POA: Diagnosis not present

## 2015-06-09 DIAGNOSIS — Z791 Long term (current) use of non-steroidal anti-inflammatories (NSAID): Secondary | ICD-10-CM | POA: Diagnosis not present

## 2015-06-09 DIAGNOSIS — Z862 Personal history of diseases of the blood and blood-forming organs and certain disorders involving the immune mechanism: Secondary | ICD-10-CM | POA: Insufficient documentation

## 2015-06-09 MED ORDER — HYDROMORPHONE HCL 1 MG/ML IJ SOLN
1.0000 mg | Freq: Once | INTRAMUSCULAR | Status: AC
  Administered 2015-06-09: 1 mg via INTRAMUSCULAR
  Filled 2015-06-09: qty 1

## 2015-06-09 MED ORDER — IBUPROFEN 800 MG PO TABS: 800.0000 mg | ORAL_TABLET | Freq: Three times a day (TID) | ORAL | Status: DC | PRN

## 2015-06-09 MED ORDER — ONDANSETRON HCL 4 MG/2ML IJ SOLN
4.0000 mg | Freq: Once | INTRAMUSCULAR | Status: AC
  Administered 2015-06-09: 4 mg via INTRAVENOUS
  Filled 2015-06-09: qty 2

## 2015-06-09 MED ORDER — HYDROMORPHONE HCL 4 MG PO TABS: 4.0000 mg | ORAL_TABLET | Freq: Four times a day (QID) | ORAL | Status: DC | PRN

## 2015-06-09 MED ORDER — SODIUM CHLORIDE 0.9 % IV BOLUS (SEPSIS)
1000.0000 mL | Freq: Once | INTRAVENOUS | Status: AC
  Administered 2015-06-09: 1000 mL via INTRAVENOUS

## 2015-06-09 NOTE — Telephone Encounter (Signed)
Patient called to check on her med refill request via e-mail.

## 2015-06-09 NOTE — ED Provider Notes (Signed)
CSN: [id number]     Arrival date & time 06/09/15  1705 History   First MD Initiated Contact with Patient 06/09/15 1727     Chief Complaint  Patient presents with  . Fall     (Consider location/radiation/quality/duration/timing/severity/associated sxs/prior Treatment) Patient is a 40 y.o. female presenting with fall. The history is provided by the patient (The patient states that she fell out of her wheelchair and hit her face no loss of consciousness).  Fall This is a new problem. The current episode started less than 1 hour ago. The problem occurs rarely. The problem has not changed since onset.Pertinent negatives include no chest pain, no abdominal pain and no headaches. Nothing aggravates the symptoms. Nothing relieves the symptoms.    Past Medical History  Diagnosis Date  . Allergy   . Anemia   . Anxiety   . Arthritis   . Asthma   . Depression   . Neuromuscular disorder ([place])   . CP (cerebral palsy) ([place])   . H/O multiple concussions    Past Surgical History  Procedure Laterality Date  . Eye surgery    . Joint replacement     Family History  Problem Relation Age of Onset  . Cancer Mother     CLL, decsd in 2012   Social History  Substance Use Topics  . Smoking status: Never Smoker   . Smokeless tobacco: None  . Alcohol Use: None   OB History    No data available     Review of Systems  Constitutional: Negative for appetite change and fatigue.  HENT: Negative for congestion, ear discharge and sinus pressure.        Headache  Eyes: Negative for discharge.  Respiratory: Negative for cough.   Cardiovascular: Negative for chest pain.  Gastrointestinal: Negative for abdominal pain and diarrhea.  Genitourinary: Negative for frequency and hematuria.  Musculoskeletal: Negative for back pain.       Right arm pain  Skin: Negative for rash.  Neurological: Negative for seizures and headaches.  Psychiatric/Behavioral: Negative for hallucinations.      Allergies   Codeine and Lortab  Home Medications   Prior to Admission medications   Medication Sig Start Date End Date Taking? Authorizing Provider  albuterol (PROVENTIL HFA;VENTOLIN HFA) 108 (90 Base) MCG/ACT inhaler Inhale 2 puffs into the lungs every 6 (six) hours as needed for wheezing or shortness of breath. 05/08/15  Yes Shawnee Knapp, MD  albuterol (PROVENTIL) (2.5 MG/3ML) 0.083% nebulizer solution Take 3 mLs (2.5 mg total) by nebulization every 6 (six) hours as needed for wheezing or shortness of breath. 05/08/15  Yes Shawnee Knapp, MD  benzonatate (TESSALON) 200 MG capsule Take 1 capsule (200 mg total) by mouth 3 (three) times daily as needed for cough. 05/08/15  Yes Shawnee Knapp, MD  busPIRone (BUSPAR) 5 MG tablet Take 5 mg by mouth 2 (two) times daily.   Yes Historical Provider, MD  butalbital-acetaminophen-caffeine (FIORICET, ESGIC) 50-325-40 MG tablet Take 1 tablet by mouth 2 (two) times daily as needed for headache.   Yes Historical Provider, MD  clonazePAM (KLONOPIN) 1 MG tablet Take 1 mg by mouth 2 (two) times daily.   Yes Historical Provider, MD  diclofenac (VOLTAREN) 50 MG EC tablet Take 50 mg by mouth 2 (two) times daily.   Yes Historical Provider, MD  ergocalciferol (VITAMIN D2) 50000 units capsule Take 1 capsule (50,000 Units total) by mouth once a week. Patient taking differently: Take 50,000 Units by mouth once a  week. Take on sun 04/01/15  Yes Shawnee Knapp, MD  esomeprazole (NEXIUM) 40 MG capsule Take 1 capsule (40 mg total) by mouth daily at 12 noon. 02/18/15  Yes Shawnee Knapp, MD  etonogestrel (NEXPLANON) 68 MG IMPL implant 1 each by Subdermal route once.   Yes Historical Provider, MD  Fluticasone-Salmeterol (ADVAIR) 100-50 MCG/DOSE AEPB Inhale 1 puff into the lungs 2 (two) times daily. 05/08/15  Yes Shawnee Knapp, MD  furosemide (LASIX) 40 MG tablet Take 1 tablet (40 mg total) by mouth daily. 03/12/15  Yes Shawnee Knapp, MD  gabapentin (NEURONTIN) 300 MG capsule Take 300 mg by mouth 3 (three) times daily.    Yes Historical Provider, MD  L-Methylfolate (DEPLIN) 15 MG TABS Take 15 mg by mouth daily.   Yes Historical Provider, MD  levocetirizine (XYZAL) 5 MG tablet Take 5 mg by mouth every evening.   Yes Historical Provider, MD  methocarbamol (ROBAXIN) 500 MG tablet Take 500 mg by mouth 3 (three) times daily.   Yes Historical Provider, MD  Omeprazole-Sodium Bicarbonate (ZEGERID PO) Take by mouth.   Yes Historical Provider, MD  ondansetron (ZOFRAN-ODT) 4 MG disintegrating tablet Take 1-2 tablets (4-8 mg total) by mouth every 8 (eight) hours as needed for nausea or vomiting. 02/18/15  Yes Shawnee Knapp, MD  potassium chloride SA (K-DUR,KLOR-CON) 20 MEQ tablet Take 1 tablet (20 mEq total) by mouth daily. 03/12/15  Yes Shawnee Knapp, MD  predniSONE (DELTASONE) 5 MG tablet Take 5 mg by mouth daily with breakfast.   Yes Historical Provider, MD  sertraline (ZOLOFT) 100 MG tablet Take 200 mg by mouth daily.   Yes Historical Provider, MD  traMADol (ULTRAM) 50 MG tablet Take 1 tablet (50 mg total) by mouth every 6 (six) hours as needed. Patient taking differently: Take 50 mg by mouth every 6 (six) hours as needed for moderate pain.  03/12/15  Yes Shawnee Knapp, MD  traZODone (DESYREL) 50 MG tablet Take 1-3 tablets (50-150 mg total) by mouth at bedtime as needed. 03/10/15  Yes Adam Telford Nab, DO  azithromycin (ZITHROMAX) 250 MG tablet Take 2 tabs PO x 1 dose, then 1 tab PO QD x 4 days Patient not taking: Reported on 06/09/2015 05/23/15   Shawnee Knapp, MD  HYDROmorphone (DILAUDID) 4 MG tablet Take 1 tablet (4 mg total) by mouth every 6 (six) hours as needed for severe pain. 06/09/15   Milton Ferguson, MD  ibuprofen (ADVIL,MOTRIN) 800 MG tablet Take 1 tablet (800 mg total) by mouth every 8 (eight) hours as needed for moderate pain. 06/09/15   Milton Ferguson, MD  ketorolac (TORADOL) 30 MG/ML injection Inject 1 mL (30 mg total) into the muscle once. Patient not taking: Reported on 06/09/2015 03/12/15   Shawnee Knapp, MD   BP 105/56 mmHg  Pulse  72  Temp(Src) 98.7 F (37.1 C) (Oral)  Resp 16  Ht 5' (1.524 m)  Wt 135 lb (61.236 kg)  BMI 26.37 kg/m2  SpO2 97%  LMP 05/30/2015 Physical Exam  Constitutional: She is oriented to person, place, and time. She appears well-developed.  HENT:  Head: Normocephalic.  Eyes: Conjunctivae and EOM are normal. No scleral icterus.  Neck: Neck supple. No thyromegaly present.  Cardiovascular: Normal rate and regular rhythm.  Exam reveals no gallop and no friction rub.   No murmur heard. Pulmonary/Chest: No stridor. She has no wheezes. She has no rales. She exhibits no tenderness.  Abdominal: She exhibits no distension. There is  no tenderness. There is no rebound.  Musculoskeletal: She exhibits no edema.  Tenderness to right forearm neurovascular exam normal  Lymphadenopathy:    She has no cervical adenopathy.  Neurological: She is oriented to person, place, and time. She exhibits normal muscle tone. Coordination normal.  Skin: No rash noted. No erythema.  Psychiatric: She has a normal mood and affect. Her behavior is normal.    ED Course  .Marland KitchenLaceration Repair Date/Time: 06/10/2015 12:00 AM Performed by: Milton Ferguson Authorized by: Milton Ferguson Comments: Patient with a 1 cm superficial laceration to right forehead. Area was cleaned thoroughly with soap and water. Dermabond was used to close laceration   (including critical care time) Labs Review Labs Reviewed - No data to display  Imaging Review Dg Forearm Right  06/09/2015  CLINICAL DATA:  [place] on outstretched arms EXAM: RIGHT FOREARM - 2 VIEW COMPARISON:  None. FINDINGS: There is no evidence of fracture or other focal bone lesions. Soft tissues are unremarkable. IMPRESSION: Negative. Electronically Signed   By: Andreas Newport M.D.   On: 06/09/2015 23:09   Ct Head Wo Contrast  06/09/2015  CLINICAL DATA:  Flipped out of wheelchair while going over small step, landing face first on pavement. Laceration and severe pain at the right  side of the face. Headache. Concern for cervical spine injury. Initial encounter. EXAM: CT HEAD WITHOUT CONTRAST CT MAXILLOFACIAL WITHOUT CONTRAST CT CERVICAL SPINE WITHOUT CONTRAST TECHNIQUE: Multidetector CT imaging of the head, cervical spine, and maxillofacial structures were performed using the standard protocol without intravenous contrast. Multiplanar CT image reconstructions of the cervical spine and maxillofacial structures were also generated. COMPARISON:  None. FINDINGS: CT HEAD FINDINGS There is no evidence of acute infarction, mass lesion, or intra- or extra-axial hemorrhage on CT. Prominence of the lateral ventricles may reflect mild cortical volume loss. The brainstem and fourth ventricle are within normal limits. The basal ganglia are unremarkable in appearance. The cerebral hemispheres demonstrate grossly normal gray-white differentiation. No mass effect or midline shift is seen. There is no evidence of fracture; visualized osseous structures are unremarkable in appearance. The visualized portions of the orbits are within normal limits. There is partial opacification of the left side of the sphenoid sinus. The remaining paranasal sinuses and mastoid air cells are well-aerated. No significant soft tissue abnormalities are seen. CT MAXILLOFACIAL FINDINGS There is no evidence of fracture or dislocation. The maxilla and mandible appear intact. The nasal bone is unremarkable in appearance. The visualized dentition demonstrates no acute abnormality. The orbits are intact bilaterally. There is partial opacification of the left side of the sphenoid sinus. The remaining visualized paranasal sinuses and mastoid air cells are well-aerated. Soft tissue swelling is noted tracking along the right side of the face, extending inferiorly from the level of the right orbit. The parapharyngeal fat planes are preserved. The nasopharynx, oropharynx and hypopharynx are unremarkable in appearance. The visualized portions  of the valleculae and piriform sinuses are grossly unremarkable. The parotid and submandibular glands are within normal limits. No cervical lymphadenopathy is seen. CT CERVICAL SPINE FINDINGS There is no evidence of fracture or subluxation. Vertebral bodies demonstrate normal height and alignment. Minimal disc space narrowing is noted along the lower cervical spine, with scattered anterior and posterior disc osteophyte complexes. Prevertebral soft tissues are within normal limits. The thyroid gland is unremarkable in appearance. The visualized lung apices are clear. No significant soft tissue abnormalities are seen. IMPRESSION: 1. No evidence of traumatic intracranial injury or fracture 2. No evidence of fracture  or dislocation with regard to the maxillofacial structures. 3. No evidence of fracture or subluxation along the cervical spine. 4. Soft tissue swelling tracking along the right side of the face, extending inferiorly from the level of the right orbit. 5. Mild cortical volume loss noted, with prominence of the lateral ventricles. 6. Partial opacification of the left side of the sphenoid sinus. 7. Minimal degenerative change at the lower cervical spine. Electronically Signed   By: Garald Balding M.D.   On: 06/09/2015 21:53   Ct Cervical Spine Wo Contrast  06/09/2015  CLINICAL DATA:  Flipped out of wheelchair while going over small step, landing face first on pavement. Laceration and severe pain at the right side of the face. Headache. Concern for cervical spine injury. Initial encounter. EXAM: CT HEAD WITHOUT CONTRAST CT MAXILLOFACIAL WITHOUT CONTRAST CT CERVICAL SPINE WITHOUT CONTRAST TECHNIQUE: Multidetector CT imaging of the head, cervical spine, and maxillofacial structures were performed using the standard protocol without intravenous contrast. Multiplanar CT image reconstructions of the cervical spine and maxillofacial structures were also generated. COMPARISON:  None. FINDINGS: CT HEAD FINDINGS There  is no evidence of acute infarction, mass lesion, or intra- or extra-axial hemorrhage on CT. Prominence of the lateral ventricles may reflect mild cortical volume loss. The brainstem and fourth ventricle are within normal limits. The basal ganglia are unremarkable in appearance. The cerebral hemispheres demonstrate grossly normal gray-white differentiation. No mass effect or midline shift is seen. There is no evidence of fracture; visualized osseous structures are unremarkable in appearance. The visualized portions of the orbits are within normal limits. There is partial opacification of the left side of the sphenoid sinus. The remaining paranasal sinuses and mastoid air cells are well-aerated. No significant soft tissue abnormalities are seen. CT MAXILLOFACIAL FINDINGS There is no evidence of fracture or dislocation. The maxilla and mandible appear intact. The nasal bone is unremarkable in appearance. The visualized dentition demonstrates no acute abnormality. The orbits are intact bilaterally. There is partial opacification of the left side of the sphenoid sinus. The remaining visualized paranasal sinuses and mastoid air cells are well-aerated. Soft tissue swelling is noted tracking along the right side of the face, extending inferiorly from the level of the right orbit. The parapharyngeal fat planes are preserved. The nasopharynx, oropharynx and hypopharynx are unremarkable in appearance. The visualized portions of the valleculae and piriform sinuses are grossly unremarkable. The parotid and submandibular glands are within normal limits. No cervical lymphadenopathy is seen. CT CERVICAL SPINE FINDINGS There is no evidence of fracture or subluxation. Vertebral bodies demonstrate normal height and alignment. Minimal disc space narrowing is noted along the lower cervical spine, with scattered anterior and posterior disc osteophyte complexes. Prevertebral soft tissues are within normal limits. The thyroid gland is  unremarkable in appearance. The visualized lung apices are clear. No significant soft tissue abnormalities are seen. IMPRESSION: 1. No evidence of traumatic intracranial injury or fracture 2. No evidence of fracture or dislocation with regard to the maxillofacial structures. 3. No evidence of fracture or subluxation along the cervical spine. 4. Soft tissue swelling tracking along the right side of the face, extending inferiorly from the level of the right orbit. 5. Mild cortical volume loss noted, with prominence of the lateral ventricles. 6. Partial opacification of the left side of the sphenoid sinus. 7. Minimal degenerative change at the lower cervical spine. Electronically Signed   By: Garald Balding M.D.   On: 06/09/2015 21:53   Ct Maxillofacial Wo Cm  06/09/2015  CLINICAL DATA:  Flipped out of wheelchair while going over small step, landing face first on pavement. Laceration and severe pain at the right side of the face. Headache. Concern for cervical spine injury. Initial encounter. EXAM: CT HEAD WITHOUT CONTRAST CT MAXILLOFACIAL WITHOUT CONTRAST CT CERVICAL SPINE WITHOUT CONTRAST TECHNIQUE: Multidetector CT imaging of the head, cervical spine, and maxillofacial structures were performed using the standard protocol without intravenous contrast. Multiplanar CT image reconstructions of the cervical spine and maxillofacial structures were also generated. COMPARISON:  None. FINDINGS: CT HEAD FINDINGS There is no evidence of acute infarction, mass lesion, or intra- or extra-axial hemorrhage on CT. Prominence of the lateral ventricles may reflect mild cortical volume loss. The brainstem and fourth ventricle are within normal limits. The basal ganglia are unremarkable in appearance. The cerebral hemispheres demonstrate grossly normal gray-white differentiation. No mass effect or midline shift is seen. There is no evidence of fracture; visualized osseous structures are unremarkable in appearance. The visualized  portions of the orbits are within normal limits. There is partial opacification of the left side of the sphenoid sinus. The remaining paranasal sinuses and mastoid air cells are well-aerated. No significant soft tissue abnormalities are seen. CT MAXILLOFACIAL FINDINGS There is no evidence of fracture or dislocation. The maxilla and mandible appear intact. The nasal bone is unremarkable in appearance. The visualized dentition demonstrates no acute abnormality. The orbits are intact bilaterally. There is partial opacification of the left side of the sphenoid sinus. The remaining visualized paranasal sinuses and mastoid air cells are well-aerated. Soft tissue swelling is noted tracking along the right side of the face, extending inferiorly from the level of the right orbit. The parapharyngeal fat planes are preserved. The nasopharynx, oropharynx and hypopharynx are unremarkable in appearance. The visualized portions of the valleculae and piriform sinuses are grossly unremarkable. The parotid and submandibular glands are within normal limits. No cervical lymphadenopathy is seen. CT CERVICAL SPINE FINDINGS There is no evidence of fracture or subluxation. Vertebral bodies demonstrate normal height and alignment. Minimal disc space narrowing is noted along the lower cervical spine, with scattered anterior and posterior disc osteophyte complexes. Prevertebral soft tissues are within normal limits. The thyroid gland is unremarkable in appearance. The visualized lung apices are clear. No significant soft tissue abnormalities are seen. IMPRESSION: 1. No evidence of traumatic intracranial injury or fracture 2. No evidence of fracture or dislocation with regard to the maxillofacial structures. 3. No evidence of fracture or subluxation along the cervical spine. 4. Soft tissue swelling tracking along the right side of the face, extending inferiorly from the level of the right orbit. 5. Mild cortical volume loss noted, with  prominence of the lateral ventricles. 6. Partial opacification of the left side of the sphenoid sinus. 7. Minimal degenerative change at the lower cervical spine. Electronically Signed   By: Garald Balding M.D.   On: 06/09/2015 21:53   I have personally reviewed and evaluated these images and lab results as part of my medical decision-making.   EKG Interpretation None      MDM   Final diagnoses:  Fall, initial encounter    Patient with fall and abrasions to head. CT scan of head and maxillofacial and neck are negative. Patient also has contusion to forearm. She will keep abrasions clean take Motrin for discomfort and follow-up as needed    Milton Ferguson, MD 06/10/15 0001

## 2015-06-09 NOTE — Discharge Instructions (Signed)
Clinical abrasions twice a day gently with soap and water and follow-up as needed

## 2015-06-09 NOTE — ED Notes (Signed)
Per Ems- Pt fell out of her wheelchair at [employer]. Pt was going forward and was talking to co-worker and did not see low set of step and was thrown face first on a concrete walkway. Pt has laceration above right eye and abrasions bilaterally to both hands and right knee. Denies LOC. Not on Blood thinners.

## 2015-06-09 NOTE — ED Notes (Signed)
Patient transported to [facility]

## 2015-06-11 ENCOUNTER — Ambulatory Visit: Payer: Worker's Compensation

## 2015-06-11 ENCOUNTER — Ambulatory Visit (INDEPENDENT_AMBULATORY_CARE_PROVIDER_SITE_OTHER): Payer: Worker's Compensation | Admitting: Family Medicine

## 2015-06-11 ENCOUNTER — Encounter: Payer: Self-pay | Admitting: Family Medicine

## 2015-06-11 VITALS — BP 100/68 | HR 87 | Temp 99.2°F | Resp 16

## 2015-06-11 DIAGNOSIS — S40819D Abrasion of unspecified upper arm, subsequent encounter: Secondary | ICD-10-CM

## 2015-06-11 DIAGNOSIS — S0081XD Abrasion of other part of head, subsequent encounter: Secondary | ICD-10-CM

## 2015-06-11 DIAGNOSIS — W108XXD Fall (on) (from) other stairs and steps, subsequent encounter: Secondary | ICD-10-CM

## 2015-06-11 DIAGNOSIS — S0591XD Unspecified injury of right eye and orbit, subsequent encounter: Secondary | ICD-10-CM | POA: Diagnosis not present

## 2015-06-11 DIAGNOSIS — S60511D Abrasion of right hand, subsequent encounter: Secondary | ICD-10-CM | POA: Diagnosis not present

## 2015-06-11 DIAGNOSIS — S80819D Abrasion, unspecified lower leg, subsequent encounter: Secondary | ICD-10-CM

## 2015-06-11 DIAGNOSIS — S60512D Abrasion of left hand, subsequent encounter: Secondary | ICD-10-CM | POA: Diagnosis not present

## 2015-06-11 MED ORDER — PREDNISONE 5 MG PO TABS: 5.0000 mg | ORAL_TABLET | Freq: Every day | ORAL | Status: DC

## 2015-06-11 MED ORDER — CLONAZEPAM 1 MG PO TABS: 1.0000 mg | ORAL_TABLET | Freq: Two times a day (BID) | ORAL | Status: DC

## 2015-06-11 MED ORDER — GABAPENTIN 300 MG PO CAPS: 300.0000 mg | ORAL_CAPSULE | Freq: Three times a day (TID) | ORAL | Status: DC

## 2015-06-11 MED ORDER — DEPLIN 15 MG PO TABS: 15.0000 mg | ORAL_TABLET | Freq: Every day | ORAL | Status: DC

## 2015-06-11 MED ORDER — RIZATRIPTAN BENZOATE 5 MG PO TABS: 5.0000 mg | ORAL_TABLET | ORAL | Status: DC | PRN

## 2015-06-11 MED ORDER — HYDROMORPHONE HCL 4 MG PO TABS: 4.0000 mg | ORAL_TABLET | ORAL | Status: DC | PRN

## 2015-06-11 MED ORDER — BUSPIRONE HCL 5 MG PO TABS: 5.0000 mg | ORAL_TABLET | Freq: Two times a day (BID) | ORAL | Status: DC

## 2015-06-11 MED ORDER — SERTRALINE HCL 100 MG PO TABS: 150.0000 mg | ORAL_TABLET | Freq: Every day | ORAL | Status: DC

## 2015-06-11 MED ORDER — ERYTHROMYCIN 5 MG/GM OP OINT: 1.0000 "application " | TOPICAL_OINTMENT | Freq: Two times a day (BID) | OPHTHALMIC | Status: DC

## 2015-06-11 MED ORDER — MUPIROCIN 2 % EX OINT: 1.0000 "application " | TOPICAL_OINTMENT | Freq: Two times a day (BID) | CUTANEOUS | Status: DC

## 2015-06-11 NOTE — Patient Instructions (Signed)
Erythromycin eye ointment twice a day into the Eye and around AFTER a wet warm compress to the eye. Gently wash your forehead with soapy and water and your fingers twice a day and apply the mupirocin. Ice for 10 to 15 minutes 3-4x/day. Dilaudid for pain. Out of work - recheck in 4days. 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  Head Injury, Adult You have received a head injury. It does not appear serious at this time. Headaches and vomiting are common following head injury. It should be easy to awaken from sleeping. Sometimes it is necessary for you to stay in the emergency department for a while for observation. Sometimes admission to the hospital may be needed. After injuries such as yours, most problems occur within the first 24 hours, but side effects may occur up to 7-10 days after the injury. It is important for you to carefully monitor your condition and contact your health care provider or seek immediate medical care if there is a change in your condition. WHAT ARE THE TYPES OF HEAD INJURIES? Head injuries can be as minor as a bump. Some head injuries can be more severe. More severe head injuries include:  A jarring injury to the brain (concussion).  A bruise of the brain (contusion). This mean there is bleeding in the brain that can cause swelling.  A cracked skull (skull fracture).  Bleeding in the brain that collects, clots, and forms a bump (hematoma). WHAT CAUSES A HEAD INJURY? A serious head injury is most likely to happen to someone who is in a car wreck and is not wearing a seat belt. Other causes of major head injuries include bicycle or motorcycle accidents, sports injuries, and falls. HOW ARE HEAD INJURIES DIAGNOSED? A complete history of the event leading to the injury and your current symptoms will be helpful in diagnosing head injuries. Many times, pictures of the brain, such as CT or MRI are needed to see  the extent of the injury. Often, an overnight hospital stay is necessary for observation.  WHEN SHOULD I SEEK IMMEDIATE MEDICAL CARE?  You should get help right away if:  You have confusion or drowsiness.  You feel sick to your stomach (nauseous) or have continued, forceful vomiting.  You have dizziness or unsteadiness that is getting worse.  You have severe, continued headaches not relieved by medicine. Only take over-the-counter or prescription medicines for pain, fever, or discomfort as directed by your health care provider.  You do not have normal function of the arms or legs or are unable to walk.  You notice changes in the black spots in the center of the colored part of your eye (pupil).  You have a clear or bloody fluid coming from your nose or ears.  You have a loss of vision. During the next 24 hours after the injury, you must stay with someone who can watch you for the warning signs. This person should contact local emergency services (911 in the [place] if you have seizures, you become unconscious, or you are unable to wake up. HOW CAN I PREVENT A HEAD INJURY IN THE FUTURE? The most important factor for preventing major head injuries is avoiding motor vehicle accidents. To minimize the potential for damage to your head, it is crucial to wear seat belts while riding in motor vehicles. Wearing helmets while bike riding and playing collision sports (like football) is also helpful. Also, avoiding dangerous activities around  the house will further help reduce your risk of head injury.  WHEN CAN I RETURN TO NORMAL ACTIVITIES AND ATHLETICS? You should be reevaluated by your health care provider before returning to these activities. If you have any of the following symptoms, you should not return to activities or contact sports until 1 week after the symptoms have stopped:  Persistent headache.  Dizziness or vertigo.  Poor attention and concentration.  Confusion.  Memory  problems.  Nausea or vomiting.  Fatigue or tire easily.  Irritability.  Intolerant of bright lights or loud noises.  Anxiety or depression.  Disturbed sleep. MAKE SURE YOU:   Understand these instructions.  Will watch your condition.  Will get help right away if you are not doing well or get worse.   This information is not intended to replace advice given to you by your health care provider. Make sure you discuss any questions you have with your health care provider.   Document Released: 02/14/2005 Document Revised: 03/07/2014 Document Reviewed: 10/22/2012 Elsevier Interactive Patient Education [employer].

## 2015-06-11 NOTE — Progress Notes (Signed)
Subjective:    Patient ID: Carolyn Pena, female    DOB: 19-Feb-1976, 40 y.o.   MRN: [id number] Chief Complaint  Patient presents with  . Head Injury    accident happen on  06/09/2015  . Eye Injury    RIGHT   . Hand Injury    abrasion of both hands and knuckles  . face abrasion    on right side     HPI  Carolyn Pena is a delightful 40 yo woman with a PMHx sig for CP with paraplegia, wheelchair bound.  She has lived her whole life in [place] and moved here to [place] late last year for a job at [employer] but it has been very difficult for her to get her medicaid and disability services transferred. [name] was seen in the ER 2d ago after she fell out of her wheelchair and hit her face w/o LOC.  She had a 1cm superficial laceration to her Rt forehead that was closed with dermabond.  CT scan of the head and maxillofacial area and neck where negative. She had a contusion to the forearm as well. She was advised to keep abrasion clean and use motrin prn.  She was noted to have partial opacification of the left side of the sphenoid sinus and had been c/o sinusitis sxs for several weeks, prev treated with zpack.  Feels very lethargic, fuzzy from concussion.  Concerned that they only did a CT scan. Prone to get infections. Did get better after treatment for sinus infection with zpack and depomedrol Was given 2 dilaudid but hasn't gotten the rx filled yet. Wasn't relieased from the hosp until very early [place].     Visual Acuity Screening   Right eye Left eye Both eyes  Without correction:     With correction: 20/25 20/25 20/25     Review of Systems  Constitutional: Positive for activity change. Negative for fever, chills, appetite change and unexpected weight change.  HENT: Positive for facial swelling. Negative for congestion, dental problem, ear discharge, ear pain, nosebleeds, sinus pressure, sore throat, trouble swallowing and voice change.   Eyes: Positive for photophobia, pain, discharge,  redness and visual disturbance. Negative for itching.  Skin: Positive for color change, pallor and wound. Negative for rash.  Neurological: Positive for facial asymmetry, weakness, light-headedness and headaches. Negative for dizziness and numbness.  Hematological: Negative for adenopathy.  Psychiatric/Behavioral: Positive for sleep disturbance.       Objective:   Physical Exam  Constitutional: She is oriented to person, place, and time. She appears well-developed and well-nourished. No distress.  HENT:  Head: Normocephalic and atraumatic.  Right Ear: External ear normal.  Eyes: Conjunctivae and EOM are normal. Pupils are equal, round, and reactive to light. No scleral icterus.  properacaine applied followed by flourescein stain. No ulcerations, abrasions, or FB palpable. Unable to get good eyelid exam due to pt's pain.  Pulmonary/Chest: Effort normal.  Neurological: She is alert and oriented to person, place, and time.  Skin: Skin is warm and dry. She is not diaphoretic. No erythema.  Psychiatric: Her speech is normal and behavior is normal. Judgment normal. Her mood appears anxious. She exhibits abnormal recent memory.   BP 100/68 mmHg  Pulse 87  Temp(Src) 99.2 F (37.3 C) (Oral)  Resp 16  Ht   Wt   SpO2 98%  LMP 05/30/2015  Tenderness over ulnar volar wrist and arm.  She has a h/o no fractures on xrays and not seen until MRI.  Left hand dominant.  Also c/o jamed pain in 3rd PIP     Dg Forearm Right  06/09/2015  CLINICAL DATA:  [place] on outstretched arms EXAM: RIGHT FOREARM - 2 VIEW COMPARISON:  None. FINDINGS: There is no evidence of fracture or other focal bone lesions. Soft tissues are unremarkable. IMPRESSION: Negative. Electronically Signed   By: Andreas Newport M.D.   On: 06/09/2015 23:09   Dg Wrist Complete Right  06/11/2015  CLINICAL DATA:  Recent fall with persistent wrist pain, subsequent encounter EXAM: RIGHT WRIST - COMPLETE 3+ VIEW COMPARISON:  06/09/2015  FINDINGS: There is no evidence of fracture or dislocation. There is no evidence of arthropathy or other focal bone abnormality. Soft tissues are unremarkable. IMPRESSION: No acute abnormality noted. Electronically Signed   By: Inez Catalina M.D.   On: 06/11/2015 15:32   Ct Head Wo Contrast  06/09/2015  CLINICAL DATA:  Flipped out of wheelchair while going over small step, landing face first on pavement. Laceration and severe pain at the right side of the face. Headache. Concern for cervical spine injury. Initial encounter. EXAM: CT HEAD WITHOUT CONTRAST CT MAXILLOFACIAL WITHOUT CONTRAST CT CERVICAL SPINE WITHOUT CONTRAST TECHNIQUE: Multidetector CT imaging of the head, cervical spine, and maxillofacial structures were performed using the standard protocol without intravenous contrast. Multiplanar CT image reconstructions of the cervical spine and maxillofacial structures were also generated. COMPARISON:  None. FINDINGS: CT HEAD FINDINGS There is no evidence of acute infarction, mass lesion, or intra- or extra-axial hemorrhage on CT. Prominence of the lateral ventricles may reflect mild cortical volume loss. The brainstem and fourth ventricle are within normal limits. The basal ganglia are unremarkable in appearance. The cerebral hemispheres demonstrate grossly normal gray-white differentiation. No mass effect or midline shift is seen. There is no evidence of fracture; visualized osseous structures are unremarkable in appearance. The visualized portions of the orbits are within normal limits. There is partial opacification of the left side of the sphenoid sinus. The remaining paranasal sinuses and mastoid air cells are well-aerated. No significant soft tissue abnormalities are seen. CT MAXILLOFACIAL FINDINGS There is no evidence of fracture or dislocation. The maxilla and mandible appear intact. The nasal bone is unremarkable in appearance. The visualized dentition demonstrates no acute abnormality. The orbits are  intact bilaterally. There is partial opacification of the left side of the sphenoid sinus. The remaining visualized paranasal sinuses and mastoid air cells are well-aerated. Soft tissue swelling is noted tracking along the right side of the face, extending inferiorly from the level of the right orbit. The parapharyngeal fat planes are preserved. The nasopharynx, oropharynx and hypopharynx are unremarkable in appearance. The visualized portions of the valleculae and piriform sinuses are grossly unremarkable. The parotid and submandibular glands are within normal limits. No cervical lymphadenopathy is seen. CT CERVICAL SPINE FINDINGS There is no evidence of fracture or subluxation. Vertebral bodies demonstrate normal height and alignment. Minimal disc space narrowing is noted along the lower cervical spine, with scattered anterior and posterior disc osteophyte complexes. Prevertebral soft tissues are within normal limits. The thyroid gland is unremarkable in appearance. The visualized lung apices are clear. No significant soft tissue abnormalities are seen. IMPRESSION: 1. No evidence of traumatic intracranial injury or fracture 2. No evidence of fracture or dislocation with regard to the maxillofacial structures. 3. No evidence of fracture or subluxation along the cervical spine. 4. Soft tissue swelling tracking along the right side of the face, extending inferiorly from the level of the right orbit. 5. Mild cortical volume loss noted,  with prominence of the lateral ventricles. 6. Partial opacification of the left side of the sphenoid sinus. 7. Minimal degenerative change at the lower cervical spine. Electronically Signed   By: Garald Balding M.D.   On: 06/09/2015 21:53   Ct Cervical Spine Wo Contrast  06/09/2015  CLINICAL DATA:  Flipped out of wheelchair while going over small step, landing face first on pavement. Laceration and severe pain at the right side of the face. Headache. Concern for cervical spine  injury. Initial encounter. EXAM: CT HEAD WITHOUT CONTRAST CT MAXILLOFACIAL WITHOUT CONTRAST CT CERVICAL SPINE WITHOUT CONTRAST TECHNIQUE: Multidetector CT imaging of the head, cervical spine, and maxillofacial structures were performed using the standard protocol without intravenous contrast. Multiplanar CT image reconstructions of the cervical spine and maxillofacial structures were also generated. COMPARISON:  None. FINDINGS: CT HEAD FINDINGS There is no evidence of acute infarction, mass lesion, or intra- or extra-axial hemorrhage on CT. Prominence of the lateral ventricles may reflect mild cortical volume loss. The brainstem and fourth ventricle are within normal limits. The basal ganglia are unremarkable in appearance. The cerebral hemispheres demonstrate grossly normal gray-white differentiation. No mass effect or midline shift is seen. There is no evidence of fracture; visualized osseous structures are unremarkable in appearance. The visualized portions of the orbits are within normal limits. There is partial opacification of the left side of the sphenoid sinus. The remaining paranasal sinuses and mastoid air cells are well-aerated. No significant soft tissue abnormalities are seen. CT MAXILLOFACIAL FINDINGS There is no evidence of fracture or dislocation. The maxilla and mandible appear intact. The nasal bone is unremarkable in appearance. The visualized dentition demonstrates no acute abnormality. The orbits are intact bilaterally. There is partial opacification of the left side of the sphenoid sinus. The remaining visualized paranasal sinuses and mastoid air cells are well-aerated. Soft tissue swelling is noted tracking along the right side of the face, extending inferiorly from the level of the right orbit. The parapharyngeal fat planes are preserved. The nasopharynx, oropharynx and hypopharynx are unremarkable in appearance. The visualized portions of the valleculae and piriform sinuses are grossly  unremarkable. The parotid and submandibular glands are within normal limits. No cervical lymphadenopathy is seen. CT CERVICAL SPINE FINDINGS There is no evidence of fracture or subluxation. Vertebral bodies demonstrate normal height and alignment. Minimal disc space narrowing is noted along the lower cervical spine, with scattered anterior and posterior disc osteophyte complexes. Prevertebral soft tissues are within normal limits. The thyroid gland is unremarkable in appearance. The visualized lung apices are clear. No significant soft tissue abnormalities are seen. IMPRESSION: 1. No evidence of traumatic intracranial injury or fracture 2. No evidence of fracture or dislocation with regard to the maxillofacial structures. 3. No evidence of fracture or subluxation along the cervical spine. 4. Soft tissue swelling tracking along the right side of the face, extending inferiorly from the level of the right orbit. 5. Mild cortical volume loss noted, with prominence of the lateral ventricles. 6. Partial opacification of the left side of the sphenoid sinus. 7. Minimal degenerative change at the lower cervical spine. Electronically Signed   By: Garald Balding M.D.   On: 06/09/2015 21:53   Dg Finger Middle Left  06/11/2015  CLINICAL DATA:  Recent fall with third digit pain, initial encounter EXAM: LEFT MIDDLE FINGER 2+V COMPARISON:  None. FINDINGS: There is no evidence of fracture or dislocation. There is no evidence of arthropathy or other focal bone abnormality. Soft tissues are unremarkable. IMPRESSION: No acute abnormality noted.  Electronically Signed   By: Inez Catalina M.D.   On: 06/11/2015 15:33   Ct Maxillofacial Wo Cm  06/09/2015  CLINICAL DATA:  Flipped out of wheelchair while going over small step, landing face first on pavement. Laceration and severe pain at the right side of the face. Headache. Concern for cervical spine injury. Initial encounter. EXAM: CT HEAD WITHOUT CONTRAST CT MAXILLOFACIAL WITHOUT  CONTRAST CT CERVICAL SPINE WITHOUT CONTRAST TECHNIQUE: Multidetector CT imaging of the head, cervical spine, and maxillofacial structures were performed using the standard protocol without intravenous contrast. Multiplanar CT image reconstructions of the cervical spine and maxillofacial structures were also generated. COMPARISON:  None. FINDINGS: CT HEAD FINDINGS There is no evidence of acute infarction, mass lesion, or intra- or extra-axial hemorrhage on CT. Prominence of the lateral ventricles may reflect mild cortical volume loss. The brainstem and fourth ventricle are within normal limits. The basal ganglia are unremarkable in appearance. The cerebral hemispheres demonstrate grossly normal gray-white differentiation. No mass effect or midline shift is seen. There is no evidence of fracture; visualized osseous structures are unremarkable in appearance. The visualized portions of the orbits are within normal limits. There is partial opacification of the left side of the sphenoid sinus. The remaining paranasal sinuses and mastoid air cells are well-aerated. No significant soft tissue abnormalities are seen. CT MAXILLOFACIAL FINDINGS There is no evidence of fracture or dislocation. The maxilla and mandible appear intact. The nasal bone is unremarkable in appearance. The visualized dentition demonstrates no acute abnormality. The orbits are intact bilaterally. There is partial opacification of the left side of the sphenoid sinus. The remaining visualized paranasal sinuses and mastoid air cells are well-aerated. Soft tissue swelling is noted tracking along the right side of the face, extending inferiorly from the level of the right orbit. The parapharyngeal fat planes are preserved. The nasopharynx, oropharynx and hypopharynx are unremarkable in appearance. The visualized portions of the valleculae and piriform sinuses are grossly unremarkable. The parotid and submandibular glands are within normal limits. No cervical  lymphadenopathy is seen. CT CERVICAL SPINE FINDINGS There is no evidence of fracture or subluxation. Vertebral bodies demonstrate normal height and alignment. Minimal disc space narrowing is noted along the lower cervical spine, with scattered anterior and posterior disc osteophyte complexes. Prevertebral soft tissues are within normal limits. The thyroid gland is unremarkable in appearance. The visualized lung apices are clear. No significant soft tissue abnormalities are seen. IMPRESSION: 1. No evidence of traumatic intracranial injury or fracture 2. No evidence of fracture or dislocation with regard to the maxillofacial structures. 3. No evidence of fracture or subluxation along the cervical spine. 4. Soft tissue swelling tracking along the right side of the face, extending inferiorly from the level of the right orbit. 5. Mild cortical volume loss noted, with prominence of the lateral ventricles. 6. Partial opacification of the left side of the sphenoid sinus. 7. Minimal degenerative change at the lower cervical spine. Electronically Signed   By: Garald Balding M.D.   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  Pt advised on concussion protocol - brain rest, no screens - no email, phone, tv.  Out of work until recheck - recheck in 4d if slowly improving,  RTC immed if worsening.  Pt reports a h/o several surgeries ot her right eye due to strabismus and she is now reported blurred vision in her right eye. Vision test normal. Pt unable to tolerate detail orbit/globe exam due to pain so start topical opthamlic antibiotic and refer to optho for further eval  1. Fall (on) (from) other stairs and steps, subsequent encounter   2. Orbit injury, right, subsequent encounter   3.  Abrasion of face and extremities, subsequent encounter     Orders Placed This Encounter  Procedures  . DG Wrist Complete Right    Standing Status: Future     Number of Occurrences: 1     Standing Expiration Date: 06/10/2016    Order Specific Question:  Reason for Exam (SYMPTOM  OR DIAGNOSIS REQUIRED)    Answer:  pain over ulnar aspect, s/p fall (follow-up)    Order Specific Question:  Is the patient pregnant?    Answer:  No    Order Specific Question:  Preferred imaging location?    Answer:  External  . DG Finger Middle Left    Standing Status: Future     Number of Occurrences: 1     Standing Expiration Date: 06/10/2016    Order Specific Question:  Reason for Exam (SYMPTOM  OR DIAGNOSIS REQUIRED)    Answer:  pain over ulnar aspect, s/p fall (follow-up)    Order Specific Question:  Is the patient pregnant?    Answer:  No    Order Specific Question:  Preferred imaging location?    Answer:  External  . Ambulatory referral to Ophthalmology    Referral Priority:  Urgent    Referral Type:  Consultation    Referral Reason:  Specialty Services Required    Requested Specialty:  Ophthalmology    Number of Visits Requested:  1    Meds ordered this encounter  Medications  . HYDROmorphone (DILAUDID) 4 MG tablet    Sig: Take 1 tablet (4 mg total) by mouth every 4 (four) hours as needed for severe pain.    Dispense:  40 tablet    Refill:  0  . mupirocin ointment (BACTROBAN) 2 %    Sig: Apply 1 application topically 2 (two) times daily.    Dispense:  30 g    Refill:  1                                               . erythromycin (ROMYCIN) ophthalmic ointment    Sig: Place 1 application into the right eye 2 (two) times daily.    Dispense:  3.5 g    Refill:  0                                                                                   [name], MD MPH

## 2015-06-15 ENCOUNTER — Ambulatory Visit (INDEPENDENT_AMBULATORY_CARE_PROVIDER_SITE_OTHER): Payer: Worker's Compensation | Admitting: Family Medicine

## 2015-06-15 VITALS — BP 118/66 | HR 89 | Temp 98.0°F | Resp 20

## 2015-06-15 DIAGNOSIS — S060X0D Concussion without loss of consciousness, subsequent encounter: Secondary | ICD-10-CM | POA: Diagnosis not present

## 2015-06-15 DIAGNOSIS — Z20820 Contact with and (suspected) exposure to varicella: Secondary | ICD-10-CM | POA: Diagnosis not present

## 2015-06-15 DIAGNOSIS — R5383 Other fatigue: Secondary | ICD-10-CM

## 2015-06-15 DIAGNOSIS — H539 Unspecified visual disturbance: Secondary | ICD-10-CM

## 2015-06-15 DIAGNOSIS — K5900 Constipation, unspecified: Secondary | ICD-10-CM | POA: Diagnosis not present

## 2015-06-15 MED ORDER — POLYETHYLENE GLYCOL 3350 17 GM/SCOOP PO POWD: 17.0000 g | Freq: Every day | ORAL | Status: DC

## 2015-06-15 MED ORDER — MUPIROCIN 2 % EX OINT
1.0000 | TOPICAL_OINTMENT | Freq: Two times a day (BID) | CUTANEOUS | Status: DC
Start: 2015-06-15 — End: 2017-07-03

## 2015-06-15 NOTE — Patient Instructions (Addendum)
IF you received an x-ray today, you will receive an invoice from [facility]. Please contact [facility] at [phone] with questions or concerns regarding your invoice.   IF you received labwork today, you will receive an invoice from [employer]. Please contact [facility] at [phone] with questions or concerns regarding your invoice.   Our billing staff will not be able to assist you with questions regarding bills from these companies.  You will be contacted with the lab results as soon as they are available. The fastest way to get your results is to activate your My Chart account. Instructions are located on the last page of this paperwork. If you have not heard from [place] regarding the results in 2 weeks, please contact this office.   Concussion, Adult A concussion, or closed-head injury, is a brain injury caused by a direct blow to the head or by a quick and sudden movement (jolt) of the head or neck. Concussions are usually not life-threatening. Even so, the effects of a concussion can be serious. If you have had a concussion before, you are more likely to experience concussion-like symptoms after a direct blow to the head.  CAUSES  Direct blow to the head, such as from running into another player during a soccer game, being hit in a fight, or hitting your head on a hard surface.  A jolt of the head or neck that causes the brain to move back and forth inside the skull, such as in a car crash. SIGNS AND SYMPTOMS The signs of a concussion can be hard to notice. Early on, they may be missed by you, family members, and health care providers. You may look fine but act or feel differently. Symptoms are usually temporary, but they may last for days, weeks, or even longer. Some symptoms may appear right away while others may not show up for hours or days. Every head injury is different. Symptoms include:  Mild to moderate headaches that will not  go away.  A feeling of pressure inside your head.  Having more trouble than usual:  Learning or remembering things you have heard.  Answering questions.  Paying attention or concentrating.  Organizing daily tasks.  Making decisions and solving problems.  Slowness in thinking, acting or reacting, speaking, or reading.  Getting lost or being easily confused.  Feeling tired all the time or lacking energy (fatigued).  Feeling drowsy.  Sleep disturbances.  Sleeping more than usual.  Sleeping less than usual.  Trouble falling asleep.  Trouble sleeping (insomnia).  Loss of balance or feeling lightheaded or dizzy.  Nausea or vomiting.  Numbness or tingling.  Increased sensitivity to:  Sounds.  Lights.  Distractions.  Vision problems or eyes that tire easily.  Diminished sense of taste or smell.  Ringing in the ears.  Mood changes such as feeling sad or anxious.  Becoming easily irritated or angry for little or no reason.  Lack of motivation.  Seeing or hearing things other people do not see or hear (hallucinations). DIAGNOSIS Your health care provider can usually diagnose a concussion based on a description of your injury and symptoms. He or she will ask whether you passed out (lost consciousness) and whether you are having trouble remembering events that happened right before and during your injury. Your evaluation might include:  A brain scan to look for signs of injury to the brain. Even if the test shows no injury, you may still have a concussion.  Blood  tests to be sure other problems are not present. TREATMENT  Concussions are usually treated in an emergency department, in urgent care, or at a clinic. You may need to stay in the hospital overnight for further treatment.  Tell your health care provider if you are taking any medicines, including prescription medicines, over-the-counter medicines, and natural remedies. Some medicines, such as blood  thinners (anticoagulants) and aspirin, may increase the chance of complications. Also tell your health care provider whether you have had alcohol or are taking illegal drugs. This information may affect treatment.  Your health care provider will send you home with important instructions to follow.  How fast you will recover from a concussion depends on many factors. These factors include how severe your concussion is, what part of your brain was injured, your age, and how healthy you were before the concussion.  Most people with mild injuries recover fully. Recovery can take time. In general, recovery is slower in older persons. Also, persons who have had a concussion in the past or have other medical problems may find that it takes longer to recover from their current injury. HOME CARE INSTRUCTIONS General Instructions  Carefully follow the directions your health care provider gave you.  Only take over-the-counter or prescription medicines for pain, discomfort, or fever as directed by your health care provider.  Take only those medicines that your health care provider has approved.  Do not drink alcohol until your health care provider says you are well enough to do so. Alcohol and certain other drugs may slow your recovery and can put you at risk of further injury.  If it is harder than usual to remember things, write them down.  If you are easily distracted, try to do one thing at a time. For example, do not try to watch TV while fixing dinner.  Talk with family members or close friends when making important decisions.  Keep all follow-up appointments. Repeated evaluation of your symptoms is recommended for your recovery.  Watch your symptoms and tell others to do the same. Complications sometimes occur after a concussion. Older adults with a brain injury may have a higher risk of serious complications, such as a blood clot on the brain.  Tell your teachers, school nurse, school  counselor, coach, athletic trainer, or work Freight forwarder about your injury, symptoms, and restrictions. Tell them about what you can or cannot do. They should watch for:  Increased problems with attention or concentration.  Increased difficulty remembering or learning new information.  Increased time needed to complete tasks or assignments.  Increased irritability or decreased ability to cope with stress.  Increased symptoms.  Rest. Rest helps the brain to heal. Make sure you:  Get plenty of sleep at night. Avoid staying up late at night.  Keep the same bedtime hours on weekends and weekdays.  Rest during the day. Take daytime naps or rest breaks when you feel tired.  Limit activities that require a lot of thought or concentration. These include:  Doing homework or job-related work.  Watching TV.  Working on the computer.  Avoid any situation where there is potential for another head injury (football, hockey, soccer, basketball, martial arts, downhill snow sports and horseback riding). Your condition will get worse every time you experience a concussion. You should avoid these activities until you are evaluated by the appropriate follow-up health care providers. Returning To Your Regular Activities You will need to return to your normal activities slowly, not all at once. You must  give your body and brain enough time for recovery.  Do not return to sports or other athletic activities until your health care provider tells you it is safe to do so.  Ask your health care provider when you can drive, ride a bicycle, or operate heavy machinery. Your ability to react may be slower after a brain injury. Never do these activities if you are dizzy.  Ask your health care provider about when you can return to work or school. Preventing Another Concussion It is very important to avoid another brain injury, especially before you have recovered. In rare cases, another injury can lead to permanent  brain damage, brain swelling, or death. The risk of this is greatest during the first 7-10 days after a head injury. Avoid injuries by:  Wearing a seat belt when riding in a car.  Drinking alcohol only in moderation.  Wearing a helmet when biking, skiing, skateboarding, skating, or doing similar activities.  Avoiding activities that could lead to a second concussion, such as contact or recreational sports, until your health care provider says it is okay.  Taking safety measures in your home.  Remove clutter and tripping hazards from floors and stairways.  Use grab bars in bathrooms and handrails by stairs.  Place non-slip mats on floors and in bathtubs.  Improve lighting in dim areas. SEEK MEDICAL CARE IF:  You have increased problems paying attention or concentrating.  You have increased difficulty remembering or learning new information.  You need more time to complete tasks or assignments than before.  You have increased irritability or decreased ability to cope with stress.  You have more symptoms than before. Seek medical care if you have any of the following symptoms for more than 2 weeks after your injury:  Lasting (chronic) headaches.  Dizziness or balance problems.  Nausea.  Vision problems.  Increased sensitivity to noise or light.  Depression or mood swings.  Anxiety or irritability.  Memory problems.  Difficulty concentrating or paying attention.  Sleep problems.  Feeling tired all the time. SEEK IMMEDIATE MEDICAL CARE IF:  You have severe or worsening headaches. These may be a sign of a blood clot in the brain.  You have weakness (even if only in one hand, leg, or part of the face).  You have numbness.  You have decreased coordination.  You vomit repeatedly.  You have increased sleepiness.  One pupil is larger than the other.  You have convulsions.  You have slurred speech.  You have increased confusion. This may be a sign of a  blood clot in the brain.  You have increased restlessness, agitation, or irritability.  You are unable to recognize people or places.  You have neck pain.  It is difficult to wake you up.  You have unusual behavior changes.  You lose consciousness. MAKE SURE YOU:  Understand these instructions.  Will watch your condition.  Will get help right away if you are not doing well or get worse.   This information is not intended to replace advice given to you by your health care provider. Make sure you discuss any questions you have with your health care provider.   Document Released: 05/07/2003 Document Revised: 03/07/2014 Document Reviewed: 09/06/2012 Elsevier Interactive Patient Education [employer].

## 2015-06-15 NOTE — Progress Notes (Signed)
By signing my name below, I, Mesha Guiyard, attest that this documentation has been prepared under the direction and in the presence of [name], MD.  Electronically Signed: Verlee Monte, Medical Scribe. 06/15/2015. 5:16 PM.  Subjective:    Patient ID: Carolyn Pena, female    DOB: [birth year], 40 y.o.   MRN: [id number]  HPI Chief Complaint  Patient presents with  . Follow-up    for head concussion still has pain and headache    HPI Comments: Carolyn Pena is a 40 y.o. female who presents to the [facility] for a follow up. Injury 4/11 when she didn't see stairs outside wheelchair tipped forward and landed on right side of face causing abrasions and contusions on right upper forehead. EMS transport to ED c-spine and maxillofacial and head CTs which were all negative, as well as x-ray of right forearm. Small laceration on left temple was derma bonded. Pt seen two days later in follow up. X-ray of right wrist and left middle finger were nl. Care for abrasions and laceration were giving finding consistent with mild concussion advise brain work until taken back work.  Pt complains of over sleeping. She reports when waking up, it's either feeling rested or sometimes fatigue. She reports still having headaches, pain in eye, and cheek bone. She reports migraines and describes the pain as foggy. She reports taking ibuprofen during the day for HA. She mentioned her dermabond fell off and was concerned about healing. Pt mentions irregular bm during times of distress. She denies taking Miralax for relief. She reports her finger is sore around her PIP.   Pt reports the aid's son that helps take care of her has the chicken pox.   Review of Systems  Constitutional: Positive for activity change and fatigue. Negative for fever and chills.  HENT: Positive for facial swelling.   Eyes: Positive for visual disturbance (flashes on the side of vision).  Gastrointestinal: Positive for  abdominal pain, constipation and abdominal distention. Negative for vomiting and diarrhea.  Musculoskeletal: Positive for myalgias, joint swelling, arthralgias and gait problem.  Skin: Positive for color change and wound.  Allergic/Immunologic: Positive for immunocompromised state.  Neurological: Positive for headaches.  Psychiatric/Behavioral: Positive for sleep disturbance.    Objective:   Physical Exam  Constitutional: Vital signs are normal. She appears well-developed and well-nourished. She is sleeping and cooperative. She is easily aroused. She does not appear ill. No distress.  Sitting in dark room due to photophobia In motorized wheelchair Abdominal obesity  HENT:  Head: Normocephalic. Head is with abrasion, with contusion, with laceration and with left periorbital erythema (and hematoma). Head is without right periorbital erythema.    Laceration along lateral left upper orbit has mild erythema and induration surrounding. Has a small amount of serous crusted drainage over.  Abrasion superior healing well with some central erythema and crust.  Eyes: Conjunctivae are normal. Pupils are equal, round, and reactive to light. Right eye exhibits discharge. Right eye exhibits normal extraocular motion. Left eye exhibits normal extraocular motion.  Neck: Neck supple.  Cardiovascular: Normal rate.   Pulmonary/Chest: Effort normal.  Musculoskeletal:  Deep hematoma over volar aspect of right forearm and some swelling of left third PIP joint  Neurological: She is easily aroused.  Skin: Skin is warm and dry.  Psychiatric: She has a normal mood and affect. Her behavior is normal.  Nursing note and vitals reviewed. BP 118/66 mmHg  Pulse 89  Temp(Src) 98 F (36.7 C)  Resp  20  LMP 05/30/2015   Assessment & Plan:   1. Concussion without loss of consciousness, subsequent encounter - pt is still reporting significant fatigue, lethargy, and headache since her head trauma - the former not just  due to dilaudid she is needing 2-3x/d. Has been complying with brain rest, staying away from screens, but still c/o "occular migraine" sxs so needs to be oow until fatigue and HA abate, then ok to slowly increase brain activities - check email, do some complex tasks/reading at home and as long as she can sustain this for 2-3 hrs w/o HA recurring, she can then return to work though pt may need to go part-time for a few d and ensure concussion sxs aren't worsening before releasing back to full duty. Pt is going to cont to need increased personal care services to help her transfer and care for wounds for the next several days  2. Vision changes - concerning that pt is still reporting flashers and blurred vision out of the lateral right eye peripheral vision - she has had several surgeries on her right eye prior for strabismus - so needs urgent optho eval - referral placed at last visit - still P likely due to holiday wkend - needs optho eval before RTW.  3. Lethargy   4. Varicella exposure - from PCA's son - pt is unsure of her immune status as had mild case as a child, check titer to see if needs varicella vaccine  5. Constipation, unspecified constipation type - from dilaudid used for laceration/contusion pain. Did very poorly on colace prior, try miralax.    Orders Placed This Encounter  Procedures  . Varicella zoster antibody, IgG    Meds ordered this encounter  Medications  . polyethylene glycol powder (GLYCOLAX/MIRALAX) powder    Sig: Take 17 g by mouth daily.    Dispense:  500 g    Refill:  1  . mupirocin ointment (BACTROBAN) 2 %    Sig: Apply 1 application topically 2 (two) times daily.    Dispense:  30 g    Refill:  1  . gabapentin (NEURONTIN) 300 MG capsule    Sig: Take 2 capsules (600 mg total) by mouth 3 (three) times daily.    Dispense:  540 capsule    Refill:  1    I personally performed the services described in this documentation, which was scribed in my presence. The  recorded information has been reviewed and considered, and addended by me as needed.  [name], MD MPH

## 2015-06-16 MED ORDER — GABAPENTIN 300 MG PO CAPS: 600.0000 mg | ORAL_CAPSULE | Freq: Three times a day (TID) | ORAL | Status: DC

## 2015-06-17 ENCOUNTER — Telehealth: Payer: Self-pay

## 2015-06-17 LAB — VARICELLA ZOSTER ANTIBODY, IGG: Varicella IgG: 1879 Index — ABNORMAL HIGH (ref <135.00)

## 2015-06-17 NOTE — Telephone Encounter (Signed)
No but I don't think this is true. I don't think any of the doctors here are under medicaid providers and having another doctor's name on her prescriptions are only going to lead to complications with refills. [name] - do you know anything about this?

## 2015-06-17 NOTE — Telephone Encounter (Signed)
Pharm sent notice asking for Rxs from Dr Brigitte Pulse to be ordered under Dr Thompson Caul name instead because pt has Medicaid as secondary ins and Dr Brigitte Pulse is not a Medicaid provider, but Dr Tamala Julian is. He asked me to note this somewhere in pt's chart. I explained I don't know of a place I can note it that it will be seen every time a provider sends a Rx, but I will try. Dr Brigitte Pulse, Juluis Rainier. Do you know anywhere in chart that providers may be more inclined to look when seeing pt or Rxing meds for her? [facility] is only place I can think of and I put a note under specialty providers notes (because that is only place I could see to do so), but don't know that providers will see this.

## 2015-06-18 ENCOUNTER — Ambulatory Visit (INDEPENDENT_AMBULATORY_CARE_PROVIDER_SITE_OTHER): Payer: Worker's Compensation | Admitting: Family Medicine

## 2015-06-18 ENCOUNTER — Ambulatory Visit (INDEPENDENT_AMBULATORY_CARE_PROVIDER_SITE_OTHER): Payer: BLUE CROSS/BLUE SHIELD | Admitting: Family Medicine

## 2015-06-18 ENCOUNTER — Encounter: Payer: Self-pay | Admitting: Family Medicine

## 2015-06-18 VITALS — BP 88/64 | HR 105 | Temp 99.0°F | Resp 16

## 2015-06-18 VITALS — BP 110/80 | HR 97

## 2015-06-18 DIAGNOSIS — W108XXD Fall (on) (from) other stairs and steps, subsequent encounter: Secondary | ICD-10-CM

## 2015-06-18 DIAGNOSIS — I959 Hypotension, unspecified: Secondary | ICD-10-CM | POA: Diagnosis not present

## 2015-06-18 DIAGNOSIS — E860 Dehydration: Secondary | ICD-10-CM

## 2015-06-18 DIAGNOSIS — S40819D Abrasion of unspecified upper arm, subsequent encounter: Secondary | ICD-10-CM

## 2015-06-18 DIAGNOSIS — H539 Unspecified visual disturbance: Secondary | ICD-10-CM

## 2015-06-18 DIAGNOSIS — S0591XD Unspecified injury of right eye and orbit, subsequent encounter: Secondary | ICD-10-CM

## 2015-06-18 DIAGNOSIS — J019 Acute sinusitis, unspecified: Secondary | ICD-10-CM | POA: Diagnosis not present

## 2015-06-18 DIAGNOSIS — S80819D Abrasion, unspecified lower leg, subsequent encounter: Secondary | ICD-10-CM

## 2015-06-18 DIAGNOSIS — S0081XD Abrasion of other part of head, subsequent encounter: Secondary | ICD-10-CM

## 2015-06-18 DIAGNOSIS — S060X0D Concussion without loss of consciousness, subsequent encounter: Secondary | ICD-10-CM

## 2015-06-18 LAB — COMPREHENSIVE METABOLIC PANEL
ALBUMIN: 4 g/dL (ref 3.6–5.1)
ALT: 12 U/L (ref 6–29)
AST: 14 U/L (ref 10–30)
Alkaline Phosphatase: 80 U/L (ref 33–115)
BILIRUBIN TOTAL: 0.5 mg/dL (ref 0.2–1.2)
BUN: 7 mg/dL (ref 7–25)
CHLORIDE: 102 mmol/L (ref 98–110)
CO2: 22 mmol/L (ref 20–31)
CREATININE: 0.46 mg/dL — AB (ref 0.50–1.10)
Calcium: 9 mg/dL (ref 8.6–10.2)
GLUCOSE: 82 mg/dL (ref 65–99)
Potassium: 3.5 mmol/L (ref 3.5–5.3)
SODIUM: 136 mmol/L (ref 135–146)
Total Protein: 6.7 g/dL (ref 6.1–8.1)

## 2015-06-18 LAB — POCT CBC
GRANULOCYTE PERCENT: 85.9 % — AB (ref 37–80)
HEMATOCRIT: 38.5 % (ref 37.7–47.9)
HEMOGLOBIN: 13.1 g/dL (ref 12.2–16.2)
Lymph, poc: 1.3 (ref 0.6–3.4)
MCH, POC: 27.8 pg (ref 27–31.2)
MCHC: 33.9 g/dL (ref 31.8–35.4)
MCV: 81.9 fL (ref 80–97)
MID (cbc): 0.4 (ref 0–0.9)
MPV: 6.9 fL (ref 0–99.8)
PLATELET COUNT, POC: 211 10*3/uL (ref 142–424)
POC GRANULOCYTE: 10.8 — AB (ref 2–6.9)
POC LYMPH %: 10.7 % (ref 10–50)
POC MID %: 3.4 %M (ref 0–12)
RBC: 4.7 M/uL (ref 4.04–5.48)
RDW, POC: 14.9 %
WBC: 12.6 10*3/uL — AB (ref 4.6–10.2)

## 2015-06-18 MED ORDER — PREDNISONE 20 MG PO TABS: ORAL_TABLET | ORAL | Status: DC

## 2015-06-18 MED ORDER — PROMETHAZINE-DM 6.25-15 MG/5ML PO SYRP: 5.0000 mL | ORAL_SOLUTION | Freq: Four times a day (QID) | ORAL | Status: DC | PRN

## 2015-06-18 MED ORDER — AMOXICILLIN-POT CLAVULANATE 875-125 MG PO TABS: 1.0000 | ORAL_TABLET | Freq: Two times a day (BID) | ORAL | Status: DC

## 2015-06-18 MED ORDER — CEFDINIR 300 MG PO CAPS: 300.0000 mg | ORAL_CAPSULE | Freq: Two times a day (BID) | ORAL | Status: DC

## 2015-06-18 NOTE — Progress Notes (Signed)
Subjective:    Patient ID: Carolyn Pena, female    DOB: 1975/12/30, 40 y.o.   MRN: [id number] Chief Complaint  Patient presents with  . Follow-up    WORKER'S COMP injury    HPI  This is Carolyn Pena's 3rd visit here in follow-up of her worker's comp injury suffered 9d prior.  She was last seen 3d prior and we are waiting on an opthalmology referral.  Fortunately, the blurred vision in her lateral right peripheral vision is starting to improve but she is still seeing white flashes of light in the right lateral periphery.  She continues to c/o of a severe HA that starts at the sight of her injury on her right temple and spreads diffusely, + photophobia.  She is still having severe tenderness to light palpation on her cheek bone and at base of right orbit. Still feels like her brain feels foggy and is having trouble thinking as well or completing tasks. Has been staying away from screens as instructed.  Unfortunately, Carolyn Pena thinks she must have picked up an acute URI when she was at her last [facility] visit as the following day she developed severe sinus infxn sxs which have steadily worsened leaving her miserable.  Carolyn Pena has CP and has a PCA to help her transfer and do ADLs - she had required more PCA hours and assistant since her head injury due to her resultant decrease in activity and worsening HA/vision change, concussion sxs resulting in decreased ability to do normal self-care tasks.   I had prev referred pt to opthamologist as pt has a h/o 2 different right eye surgeries for strabismus and since the fall causing laceration and contusion of her right temple and orbit she has been experiencing blurred vision and flashes in her right lateral peripheral vision.  She is very upset and frustrated today as because the worker's comp insurance haven't contracted with any of our numerous excellent local specialists - neuro, ortho, optho - (although of course most of the groups in town do work with many other  worker's IT consultant) - they have decided to send her to a PODIATRIST who specialized in sports medicine for evaluation of her vision changes. . . . .  AND this provider is in [place] area - about an hour trip away - for which pt does NOT have transportation AND they can't see her for another week!!!!   I am really unclear how a need for evaluation of vision change after head trauma with a h/o strabismus surgery is going to be effectively evaluated by a podiatrist.  It is very reassuring that pt's blurred vision has resolved but she is very concerned about underlying eye injury and feels unable to go back to work while still having the concerning VISION change esp as it is worsened by using screens, and bright lights/sun.   Last night she developed severe nausea but attributed it to no food and lots of medicines. + constipation due to Dilaudid and tramadol needed for injury.   Review of Systems  Constitutional: Positive for diaphoresis, activity change, appetite change and fatigue. Negative for chills and unexpected weight change.  HENT: Positive for dental problem, facial swelling and sinus pressure. Negative for ear discharge, ear pain, hearing loss, nosebleeds and tinnitus.   Eyes: Positive for photophobia, pain and visual disturbance. Negative for discharge, redness and itching.  Gastrointestinal: Positive for nausea and constipation. Negative for vomiting.  Genitourinary: Positive for decreased urine volume.  Musculoskeletal: Positive for joint swelling, arthralgias,  gait problem, neck pain and neck stiffness.  Skin: Positive for color change, pallor and wound. Negative for rash.  Allergic/Immunologic: Positive for immunocompromised state.  Neurological: Positive for facial asymmetry, weakness, light-headedness and headaches. Negative for dizziness, syncope, speech difficulty and numbness.  Hematological: Does not bruise/bleed easily.  Psychiatric/Behavioral: Positive for sleep  disturbance.       Objective:  BP 88/64 mmHg  Pulse 105  Temp(Src) 99 F (37.2 C) (Oral)  Resp 16  Ht   Wt   SpO2 98%  LMP 05/30/2015  Physical Exam  Constitutional: Vital signs are normal. She appears well-developed and well-nourished. She is sleeping and cooperative. She is easily aroused. She does not appear ill. No distress.  Sitting in dark room due to photophobia In motorized wheelchair Abdominal obesity  HENT:  Head: Normocephalic. Head is with abrasion, with contusion, with laceration and with left periorbital erythema (and hematoma). Head is without right periorbital erythema.    Laceration along lateral left upper orbit has mild erythema and induration surrounding. Has a small amount of serous crusted drainage over.  Abrasion superior healing well with some central erythema and crust.  Eyes: Conjunctivae are normal. Pupils are equal, round, and reactive to light. Right eye exhibits no chemosis and no discharge. Right eye exhibits normal extraocular motion. Left eye exhibits normal extraocular motion.  Neck: Neck supple.  Cardiovascular: Regular rhythm.  Tachycardia present.   Pulmonary/Chest: Effort normal.  Neurological: She is easily aroused.  Skin: Skin is warm and dry.  Psychiatric: Her speech is normal and behavior is normal. Judgment and thought content normal. Cognition and memory are normal.  Nursing note and vitals reviewed.     Assessment & Plan:   1. Concussion without loss of consciousness, subsequent encounter - pt is slowly improving but it has been 9d since her injury now and she is still symptomatic despite physical and mental rest so refer to neuro for further eval - pt concerned about poss development of post-concussive syndrome.  2. Vision changes - very concerning that opthamology evaluation has been assigned to a podiatrist which is a week away and pt does not have transportation to - i assume that there must have been a misunderstanding of pt's sxs  or reason for specialty referral so will try for neuro referral since optho was so unsuccessful.    3. Fall (on) (from) other stairs and steps, subsequent encounter   4. Orbit injury, right, subsequent encounter   5. Abrasion of face and extremities, subsequent encounter    Recheck in 3-5d - sooner if sxs not improving or worsening.  Really think pt needs to be cleared by optho prior to her returning to work - I am hopeful she may be able to return on at least part-time basis in 5d though currently this is being held up by insurance authorization.  Orders Placed This Encounter  Procedures  . Ambulatory referral to Neurology    Referral Priority:  Urgent    Referral Type:  Consultation    Referral Reason:  Specialty Services Required    Requested Specialty:  Neurology    Number of Visits Requested:  1      [name], MD MPH

## 2015-06-18 NOTE — Patient Instructions (Addendum)
Start the Augmentin tonight.  Use the tessalon for cough and the dilaudid for severe cough and pains. Start the prednisone tomorrow morning to relief sinus pressure.  Use the promethazine cough syrup for cough though it should help the nausea too. Then can use the zofran in addition for nausea if needed.  You should definitely be feeling better by Carolyn Pena - I am  Hoping you will be able to go back to work Monday. Come in and see me Carolyn Pena if you are not feeling sig better.  Dehydration, Adult Dehydration is a condition in which you do not have enough fluid or water in your body. It happens when you take in less fluid than you lose. Vital organs such as the kidneys, brain, and heart cannot function without a proper amount of fluids. Any loss of fluids from the body can cause dehydration.  Dehydration can range from mild to severe. This condition should be treated right away to help prevent it from becoming severe. CAUSES  This condition may be caused by:  Vomiting.  Diarrhea.  Excessive sweating, such as when exercising in hot or humid weather.  Not drinking enough fluid during strenuous exercise or during an illness.  Excessive urine output.  Fever.  Certain medicines. RISK FACTORS This condition is more likely to develop in:  People who are taking certain medicines that cause the body to lose excess fluid (diuretics).   People who have a chronic illness, such as diabetes, that may increase urination.  Older adults.   People who live at high altitudes.   People who participate in endurance sports.  SYMPTOMS  Mild Dehydration  Thirst.  Dry lips.  Slightly dry mouth.  Dry, warm skin. Moderate Dehydration  Very dry mouth.   Muscle cramps.   Dark urine and decreased urine production.   Decreased tear production.   Headache.   Light-headedness, especially when you stand up from a sitting position.  Severe Dehydration  Changes in skin.   Cold and clammy skin.    Skin does not spring back quickly when lightly pinched and released.   Changes in body fluids.   Extreme thirst.   No tears.   Not able to sweat when body temperature is high, such as in hot weather.   Minimal urine production.   Changes in vital signs.   Rapid, weak pulse (more than 100 beats per minute when you are sitting still).   Rapid breathing.   Low blood pressure.   Other changes.   Sunken eyes.   Cold hands and feet.   Confusion.  Lethargy and difficulty being awakened.  Fainting (syncope).   Short-term weight loss.   Unconsciousness. DIAGNOSIS  This condition may be diagnosed based on your symptoms. You may also have tests to determine how severe your dehydration is. These tests may include:   Urine tests.   Blood tests.  TREATMENT  Treatment for this condition depends on the severity. Mild or moderate dehydration can often be treated at home. Treatment should be started right away. Do not wait until dehydration becomes severe. Severe dehydration needs to be treated at the hospital. Treatment for Mild Dehydration  Drinking plenty of water to replace the fluid you have lost.   Replacing minerals in your blood (electrolytes) that you may have lost.  Treatment for Moderate Dehydration  Consuming oral rehydration solution (ORS). Treatment for Severe Dehydration  Receiving fluid through an IV tube.   Receiving electrolyte solution through a feeding tube that is passed through  your nose and into your stomach (nasogastric tube or NG tube).  Correcting any abnormalities in electrolytes. HOME CARE INSTRUCTIONS   Drink enough fluid to keep your urine clear or pale yellow.   Drink water or fluid slowly by taking small sips. You can also try sucking on ice cubes.  Have food or beverages that contain electrolytes. Examples include bananas and sports drinks.  Take over-the-counter and prescription medicines only as told by  your health care provider.   Prepare ORS according to the manufacturer's instructions. Take sips of ORS every 5 minutes until your urine returns to normal.  If you have vomiting or diarrhea, continue to try to drink water, ORS, or both.   If you have diarrhea, avoid:   Beverages that contain caffeine.   Fruit juice.   Milk.   Carbonated soft drinks.  Do not take salt tablets. This can lead to the condition of having too much sodium in your body (hypernatremia).  SEEK MEDICAL CARE IF:  You cannot eat or drink without vomiting.  You have had moderate diarrhea during a period of more than 24 hours.  You have a fever. SEEK IMMEDIATE MEDICAL CARE IF:   You have extreme thirst.  You have severe diarrhea.  You have not urinated in 6-8 hours, or you have urinated only a small amount of very dark urine.  You have shriveled skin.  You are dizzy, confused, or both.   This information is not intended to replace advice given to you by your health care provider. Make sure you discuss any questions you have with your health care provider.   Document Released: 02/14/2005 Document Revised: 11/05/2014 Document Reviewed: 07/02/2014 Elsevier Interactive Patient Education 2016 [employer] Sinusitis, Adult Sinusitis is redness, soreness, and inflammation of the paranasal sinuses. Paranasal sinuses are air pockets within the bones of your face. They are located beneath your eyes, in the middle of your forehead, and above your eyes. In healthy paranasal sinuses, mucus is able to drain out, and air is able to circulate through them by way of your nose. However, when your paranasal sinuses are inflamed, mucus and air can become trapped. This can allow bacteria and other germs to grow and cause infection. Sinusitis can develop quickly and last only a short time (acute) or continue over a long period (chronic). Sinusitis that lasts for more than 12 weeks is considered  chronic. CAUSES Causes of sinusitis include:  Allergies.  Structural abnormalities, such as displacement of the cartilage that separates your nostrils (deviated septum), which can decrease the air flow through your nose and sinuses and affect sinus drainage.  Functional abnormalities, such as when the small hairs (cilia) that line your sinuses and help remove mucus do not work properly or are not present. SIGNS AND SYMPTOMS Symptoms of acute and chronic sinusitis are the same. The primary symptoms are pain and pressure around the affected sinuses. Other symptoms include:  Upper toothache.  Earache.  Headache.  Bad breath.  Decreased sense of smell and taste.  A cough, which worsens when you are lying flat.  Fatigue.  Fever.  Thick drainage from your nose, which often is green and may contain pus (purulent).  Swelling and warmth over the affected sinuses. DIAGNOSIS Your health care provider will perform a physical exam. During your exam, your health care provider may perform any of the following to help determine if you have acute sinusitis or chronic sinusitis:  Look in your nose for signs of abnormal growths in your  nostrils (nasal polyps).  Tap over the affected sinus to check for signs of infection.  View the inside of your sinuses using an imaging device that has a light attached (endoscope). If your health care provider suspects that you have chronic sinusitis, one or more of the following tests may be recommended:  Allergy tests.  Nasal culture. A sample of mucus is taken from your nose, sent to a lab, and screened for bacteria.  Nasal cytology. A sample of mucus is taken from your nose and examined by your health care provider to determine if your sinusitis is related to an allergy. TREATMENT Most cases of acute sinusitis are related to a viral infection and will resolve on their own within 10 days. Sometimes, medicines are prescribed to help relieve symptoms of  both acute and chronic sinusitis. These may include pain medicines, decongestants, nasal steroid sprays, or saline sprays. However, for sinusitis related to a bacterial infection, your health care provider will prescribe antibiotic medicines. These are medicines that will help kill the bacteria causing the infection. Rarely, sinusitis is caused by a fungal infection. In these cases, your health care provider will prescribe antifungal medicine. For some cases of chronic sinusitis, surgery is needed. Generally, these are cases in which sinusitis recurs more than 3 times per year, despite other treatments. HOME CARE INSTRUCTIONS  Drink plenty of water. Water helps thin the mucus so your sinuses can drain more easily.  Use a humidifier.  Inhale steam 3-4 times a day (for example, sit in the bathroom with the shower running).  Apply a warm, moist washcloth to your face 3-4 times a day, or as directed by your health care provider.  Use saline nasal sprays to help moisten and clean your sinuses.  Take medicines only as directed by your health care provider.  If you were prescribed either an antibiotic or antifungal medicine, finish it all even if you start to feel better. SEEK IMMEDIATE MEDICAL CARE IF:  You have increasing pain or severe headaches.  You have nausea, vomiting, or drowsiness.  You have swelling around your face.  You have vision problems.  You have a stiff neck.  You have difficulty breathing.   This information is not intended to replace advice given to you by your health care provider. Make sure you discuss any questions you have with your health care provider.   Document Released: 02/14/2005 Document Revised: 03/07/2014 Document Reviewed: 03/01/2011 Elsevier Interactive Patient Education [employer].

## 2015-06-18 NOTE — Progress Notes (Signed)
Subjective:    Patient ID: Carolyn Pena, female    DOB: 1975/04/22, 40 y.o.   MRN: [id number]  HPI I saw Carolyn Pena in follow-up from her workers comp injury 3d prior and she thinks she must have picked up something while waiting at the urgent care clinic. She woke up on Tuesday in the middle of the night feeling very poorly. She has a lot of sinus pressure and a sore throat, post-nasal drip, trouble sleelopng, swallowing, cough, mucous and phlegm of green and yellow mucous. Was coughing so much there was some blood in her sputum as well. She has felt diaphoretic with decreased appetite. Has used a lot of Robitussin DM, alka-seltzer sinus cough cong w/o anysig relief. Last night she developed severe nausea but attributed it to no food on her stomach while taking lots of cough/cold meds. She has not been able to take any of her regular meds with the nausea. Has not used the tessalon or the zofran she has at home.  She did have a sinus infection 1 mo ago treated with a Depo-Medrol shot and then needed to use a zpack after sxs persisted.  She can't tolerate Augmentin - causes nausea and abd pain.  Did a long course of Cipro and Bactrim with Rocephin several times 4 mos prior for pyelonephritis. No other recent antibiotics.  Past Medical History  Diagnosis Date  . Allergy   . Anemia   . Anxiety   . Arthritis   . Asthma   . Depression   . Neuromuscular disorder ([place])   . CP (cerebral palsy) ([place])   . H/O multiple concussions    Past Surgical History  Procedure Laterality Date  . Eye surgery    . Joint replacement     Current Outpatient Prescriptions on File Prior to Visit  Medication Sig Dispense Refill  . albuterol (PROVENTIL HFA;VENTOLIN HFA) 108 (90 Base) MCG/ACT inhaler Inhale 2 puffs into the lungs every 6 (six) hours as needed for wheezing or shortness of breath. 18 g 11  . albuterol (PROVENTIL) (2.5 MG/3ML) 0.083% nebulizer solution Take 3 mLs (2.5 mg total) by nebulization every 6  (six) hours as needed for wheezing or shortness of breath. 150 mL 1  . benzonatate (TESSALON) 200 MG capsule Take 1 capsule (200 mg total) by mouth 3 (three) times daily as needed for cough. 60 capsule 3  . busPIRone (BUSPAR) 5 MG tablet Take 1 tablet (5 mg total) by mouth 2 (two) times daily. 180 tablet 1  . butalbital-acetaminophen-caffeine (FIORICET, ESGIC) 50-325-40 MG tablet Take 1 tablet by mouth 2 (two) times daily as needed for headache.    . cefdinir (OMNICEF) 300 MG capsule Take 1 capsule (300 mg total) by mouth 2 (two) times daily. 20 capsule 0  . clonazePAM (KLONOPIN) 1 MG tablet Take 1 tablet (1 mg total) by mouth 2 (two) times daily. 60 tablet 5  . diclofenac (VOLTAREN) 50 MG EC tablet Take 50 mg by mouth 2 (two) times daily.    . ergocalciferol (VITAMIN D2) 50000 units capsule Take 1 capsule (50,000 Units total) by mouth once a week. (Patient taking differently: Take 50,000 Units by mouth once a week. Take on sun) 12 capsule 1  . erythromycin (ROMYCIN) ophthalmic ointment Place 1 application into the right eye 2 (two) times daily. 3.5 g 0  . esomeprazole (NEXIUM) 40 MG capsule Take 1 capsule (40 mg total) by mouth daily at 12 noon. 30 capsule 11  . etonogestrel (NEXPLANON) 68 MG IMPL  implant 1 each by Subdermal route once.    . Fluticasone-Salmeterol (ADVAIR) 100-50 MCG/DOSE AEPB Inhale 1 puff into the lungs 2 (two) times daily. 60 each 5  . furosemide (LASIX) 40 MG tablet Take 1 tablet (40 mg total) by mouth daily. 30 tablet 3  . gabapentin (NEURONTIN) 300 MG capsule Take 2 capsules (600 mg total) by mouth 3 (three) times daily. 540 capsule 1  . HYDROmorphone (DILAUDID) 4 MG tablet Take 1 tablet (4 mg total) by mouth every 4 (four) hours as needed for severe pain. 40 tablet 0  . ibuprofen (ADVIL,MOTRIN) 800 MG tablet Take 1 tablet (800 mg total) by mouth every 8 (eight) hours as needed for moderate pain. 30 tablet 0  . ketorolac (TORADOL) 30 MG/ML injection Inject 1 mL (30 mg total)  into the muscle once. 10 mL 2  . L-Methylfolate (DEPLIN) 15 MG TABS Take 15 mg by mouth daily. 30 tablet 5  . levocetirizine (XYZAL) 5 MG tablet Take 5 mg by mouth every evening.    . methocarbamol (ROBAXIN) 500 MG tablet Take 500 mg by mouth 3 (three) times daily.    . mupirocin ointment (BACTROBAN) 2 % Apply 1 application topically 2 (two) times daily. 30 g 1  . ondansetron (ZOFRAN-ODT) 4 MG disintegrating tablet Take 1-2 tablets (4-8 mg total) by mouth every 8 (eight) hours as needed for nausea or vomiting. 30 tablet 2  . polyethylene glycol powder (GLYCOLAX/MIRALAX) powder Take 17 g by mouth daily. 500 g 1  . potassium chloride SA (K-DUR,KLOR-CON) 20 MEQ tablet Take 1 tablet (20 mEq total) by mouth daily. 30 tablet 3  . predniSONE (DELTASONE) 5 MG tablet Take 1 tablet (5 mg total) by mouth daily with breakfast. 30 tablet 1  . rizatriptan (MAXALT) 5 MG tablet Take 1 tablet (5 mg total) by mouth as needed for migraine. 10 tablet 7  . sertraline (ZOLOFT) 100 MG tablet Take 1.5 tablets (150 mg total) by mouth daily. 135 tablet 0  . traMADol (ULTRAM) 50 MG tablet Take 1 tablet (50 mg total) by mouth every 6 (six) hours as needed. (Patient taking differently: Take 50 mg by mouth every 6 (six) hours as needed for moderate pain. ) 120 tablet 0  . traZODone (DESYREL) 50 MG tablet Take 1-3 tablets (50-150 mg total) by mouth at bedtime as needed. 30 tablet 3   No current facility-administered medications on file prior to visit.   Allergies  Allergen Reactions  . Codeine Nausea And Vomiting  . Lortab [Hydrocodone-Acetaminophen] Nausea And Vomiting   Family History  Problem Relation Age of Onset  . Cancer Mother     CLL, decsd in 2012   Social History   Social History  . Marital Status: Single    Spouse Name: N/A  . Number of Children: N/A  . Years of Education: N/A   Social History Main Topics  . Smoking status: Never Smoker   . Smokeless tobacco: Not on file  . Alcohol Use: Not on file    . Drug Use: No  . Sexual Activity: Not on file   Other Topics Concern  . Not on file   Social History Narrative   Pt has doctorate degree      Review of Systems  Constitutional: Positive for fever, chills, diaphoresis, activity change, appetite change and fatigue. Negative for unexpected weight change.  HENT: Positive for congestion, facial swelling, postnasal drip, rhinorrhea, sinus pressure, sore throat and trouble swallowing. Negative for drooling, ear discharge, ear pain,  hearing loss, mouth sores, nosebleeds, sneezing and voice change.   Eyes: Positive for photophobia, pain and visual disturbance. Negative for discharge and itching.  Respiratory: Positive for cough. Negative for shortness of breath.   Cardiovascular: Positive for leg swelling. Negative for chest pain.  Gastrointestinal: Positive for nausea and constipation. Negative for vomiting, abdominal pain, diarrhea and abdominal distention.  Genitourinary: Positive for decreased urine volume. Negative for dysuria and frequency.  Musculoskeletal: Positive for back pain, joint swelling, gait problem, neck pain and neck stiffness. Negative for myalgias and arthralgias.  Skin: Negative for rash.  Allergic/Immunologic: Positive for immunocompromised state (on chronic prednisone).  Neurological: Positive for weakness, light-headedness and headaches. Negative for dizziness and syncope.  Hematological: Negative for adenopathy. Does not bruise/bleed easily.  Psychiatric/Behavioral: Positive for sleep disturbance.       Objective:  BP 110/80 mmHg  Pulse 97  SpO2 96%  LMP 05/30/2015 - vitals AFTER 1 L NS IVF - prior to IVF BP 80/60 with Pulse 105 - see other visit  Physical Exam  Constitutional: She is oriented to person, place, and time. She appears well-developed and well-nourished. She appears lethargic. She appears ill. No distress.  HENT:  Head: Normocephalic. Head is with abrasion, with contusion and with laceration.   Right Ear: Tympanic membrane, external ear and ear canal normal.  Left Ear: Tympanic membrane, external ear and ear canal normal.  Nose: No mucosal edema or rhinorrhea. Right sinus exhibits maxillary sinus tenderness. Left sinus exhibits maxillary sinus tenderness.  Mouth/Throat: Uvula is midline, oropharynx is clear and moist and mucous membranes are normal. No oropharyngeal exudate.  Eyes: Conjunctivae are normal. Right eye exhibits no discharge. Left eye exhibits no discharge. No scleral icterus.  Neck: Normal range of motion. Neck supple.  Cardiovascular: Normal rate, regular rhythm, normal heart sounds and intact distal pulses.   Pulmonary/Chest: Effort normal and breath sounds normal.  Abdominal: Soft. Bowel sounds are normal.  Lymphadenopathy:    She has no cervical adenopathy.  Neurological: She is oriented to person, place, and time. She appears lethargic.  Skin: Skin is warm. She is diaphoretic. No erythema. There is pallor.  Psychiatric: Her speech is normal. Judgment normal. She is slowed. Cognition and memory are normal. She exhibits a depressed mood.   Results for orders placed or performed in visit on 06/18/15  Comprehensive metabolic panel  Result Value Ref Range   Sodium 136 135 - 146 mmol/L   Potassium 3.5 3.5 - 5.3 mmol/L   Chloride 102 98 - 110 mmol/L   CO2 22 20 - 31 mmol/L   Glucose, Bld 82 65 - 99 mg/dL   BUN 7 7 - 25 mg/dL   Creat 0.46 (L) 0.50 - 1.10 mg/dL   Total Bilirubin 0.5 0.2 - 1.2 mg/dL   Alkaline Phosphatase 80 33 - 115 U/L   AST 14 10 - 30 U/L   ALT 12 6 - 29 U/L   Total Protein 6.7 6.1 - 8.1 g/dL   Albumin 4.0 3.6 - 5.1 g/dL   Calcium 9.0 8.6 - 10.2 mg/dL  POCT CBC  Result Value Ref Range   WBC 12.6 (A) 4.6 - 10.2 K/uL   Lymph, poc 1.3 0.6 - 3.4   POC LYMPH PERCENT 10.7 10 - 50 %L   MID (cbc) 0.4 0 - 0.9   POC MID % 3.4 0 - 12 %M   POC Granulocyte 10.8 (A) 2 - 6.9   Granulocyte percent 85.9 (A) 37 - 80 %G   RBC  4.70 4.04 - 5.48 M/uL    Hemoglobin 13.1 12.2 - 16.2 g/dL   HCT, POC 38.5 37.7 - 47.9 %   MCV 81.9 80 - 97 fL   MCH, POC 27.8 27 - 31.2 pg   MCHC 33.9 31.8 - 35.4 g/dL   RDW, POC 14.9 %   Platelet Count, POC 211 142 - 424 K/uL   MPV 6.9 0 - 99.8 fL       Assessment & Plan:   1. Acute sinusitis, recurrence not specified, unspecified location - ok to stop chronic pred 5 qd while on high dose taper. Pt declines augmentin due to n/v/abd pain with it but reports dong fine on cephalosporins prior so will try omnicef - start tonight with preg taper starting tomorrow. Recheck in 3-5d, sooner if worse.  2. Dehydration - pt given 1L NS IVF in office over 2 hours time due to complaints of decreased urination, tachycardia, lightheaded - push fluids, try promethazine cough syrup but has zofran at home if needed as well.  3. Hypotension, unspecified hypotension type - improved after IVF in office    Orders Placed This Encounter  Procedures  . Comprehensive metabolic panel  . POCT CBC  . Insert peripheral IV    Meds ordered this encounter  Medications  . DISCONTD: amoxicillin-clavulanate (AUGMENTIN) 875-125 MG tablet    Sig: Take 1 tablet by mouth 2 (two) times daily.    Dispense:  20 tablet    Refill:  0  . predniSONE (DELTASONE) 20 MG tablet    Sig: 3 tabs po qd x 3d, 2 tabs po qd x 3d, 1 tab po qd x 3d    Dispense:  18 tablet    Refill:  0  . promethazine-dextromethorphan (PROMETHAZINE-DM) 6.25-15 MG/5ML syrup    Sig: Take 5 mLs by mouth 4 (four) times daily as needed for cough.    Dispense:  180 mL    Refill:  0      [name], MD MPH

## 2015-06-21 ENCOUNTER — Ambulatory Visit (INDEPENDENT_AMBULATORY_CARE_PROVIDER_SITE_OTHER): Payer: BLUE CROSS/BLUE SHIELD | Admitting: Family Medicine

## 2015-06-21 ENCOUNTER — Ambulatory Visit (INDEPENDENT_AMBULATORY_CARE_PROVIDER_SITE_OTHER): Payer: Worker's Compensation | Admitting: Family Medicine

## 2015-06-21 ENCOUNTER — Other Ambulatory Visit: Payer: Self-pay | Admitting: Family Medicine

## 2015-06-21 ENCOUNTER — Ambulatory Visit: Payer: Worker's Compensation

## 2015-06-21 VITALS — BP 108/73 | HR 85 | Temp 99.2°F | Resp 15

## 2015-06-21 DIAGNOSIS — S91114A Laceration without foreign body of right lesser toe(s) without damage to nail, initial encounter: Secondary | ICD-10-CM | POA: Diagnosis not present

## 2015-06-21 DIAGNOSIS — J0191 Acute recurrent sinusitis, unspecified: Secondary | ICD-10-CM | POA: Diagnosis not present

## 2015-06-21 DIAGNOSIS — S060X0D Concussion without loss of consciousness, subsequent encounter: Secondary | ICD-10-CM

## 2015-06-21 DIAGNOSIS — S0591XD Unspecified injury of right eye and orbit, subsequent encounter: Secondary | ICD-10-CM

## 2015-06-21 DIAGNOSIS — H539 Unspecified visual disturbance: Secondary | ICD-10-CM | POA: Diagnosis not present

## 2015-06-21 DIAGNOSIS — W108XXD Fall (on) (from) other stairs and steps, subsequent encounter: Secondary | ICD-10-CM | POA: Diagnosis not present

## 2015-06-21 NOTE — Progress Notes (Signed)
Subjective:    Patient ID: Carolyn Pena, female    DOB: June 20, 1975, 40 y.o.   MRN: [id number] Chief Complaint  Patient presents with  . Follow-up    WC follow up/ no improvement since Thursday, still with visional flashes   . Foot Injury    cut on right foot, happen yesterday concern with wound care     HPI  She is still having flashes out of her right lateral peripheral vision.  When she tries to use the computer, her eyes quickly fatigue and get blurred with vision with increased floaters.   Her WC insurance called and she has an appointment with an opthalmologist for eval in 2d!!   She is really wanting to go back to work.  Still feeling horrible, still with HA though more intermittent.  Head wound healing and bruising faded.    Review of Systems  Constitutional: Positive for activity change and fatigue. Negative for fever, chills, diaphoresis and unexpected weight change.  HENT: Positive for congestion, facial swelling and sinus pressure. Negative for ear pain, nosebleeds, tinnitus, trouble swallowing and voice change.   Eyes: Positive for pain and visual disturbance. Negative for photophobia, discharge and redness.  Cardiovascular: Positive for leg swelling.  Gastrointestinal: Negative for vomiting.  Musculoskeletal: Positive for myalgias, joint swelling, arthralgias and gait problem.  Skin: Positive for pallor and wound. Negative for rash.  Allergic/Immunologic: Positive for immunocompromised state.  Neurological: Positive for facial asymmetry, weakness and headaches. Negative for dizziness, tremors, seizures, syncope, speech difficulty, light-headedness and numbness.  Psychiatric/Behavioral: Positive for sleep disturbance.       Objective:  BP 108/73 mmHg  Pulse 85  Temp(Src) 99.2 F (37.3 C) (Oral)  Resp 15  Ht   Wt   SpO2 99%  LMP 05/30/2015  Physical Exam  Constitutional: She is oriented to person, place, and time. Vital signs are normal. She appears  well-developed and well-nourished. She is sleeping and cooperative. She is easily aroused. She does not appear ill. No distress.  In motorized wheelchair Abdominal obesity  HENT:  Head: Normocephalic. Head is with abrasion, with contusion, with laceration and with left periorbital erythema (and hematoma). Head is without right periorbital erythema.    Right Ear: External ear normal.  Laceration along lateral left upper orbit has mild erythema and induration surrounding. Abrasion superior healing well.  Eyes: Conjunctivae are normal. Pupils are equal, round, and reactive to light. Right eye exhibits no chemosis and no discharge. No scleral icterus. Right eye exhibits normal extraocular motion. Left eye exhibits normal extraocular motion.  Neck: Neck supple.  Cardiovascular: Regular rhythm.  Tachycardia present.   Pulmonary/Chest: Effort normal.  Neurological: She is alert, oriented to person, place, and time and easily aroused.  Skin: Skin is warm and dry. She is not diaphoretic. No erythema.  Psychiatric: She has a normal mood and affect. Her speech is normal and behavior is normal. Judgment and thought content normal. Cognition and memory are normal.  Nursing note and vitals reviewed.     Assessment & Plan:   1. Fall (on) (from) other stairs and steps, subsequent encounter   2. Orbit injury, right, subsequent encounter   3. Concussion without loss of consciousness, subsequent encounter   4. Vision changes   Pt really wanting to go back to work but admits that her vision changes are persisting and worsen when uses the computer for even a short period so encouraged pt to have optho eval in 2d first - ok to return at part-time  if cleared by optho.  Has recheck sched in 4d    [name], MD MPH

## 2015-06-21 NOTE — Patient Instructions (Signed)
     IF you received an x-ray today, you will receive an invoice from [facility]. Please contact [facility] at [phone] with questions or concerns regarding your invoice.   IF you received labwork today, you will receive an invoice from [facility]. Please contact [facility] at [phone] with questions or concerns regarding your invoice.   Our billing staff will not be able to assist you with questions regarding bills from these companies.  You will be contacted with the lab results as soon as they are available. The fastest way to get your results is to activate your My Chart account. Instructions are located on the last page of this paperwork. If you have not heard from us regarding the results in 2 weeks, please contact this office.      

## 2015-06-21 NOTE — Progress Notes (Signed)
Subjective:    Patient ID: Carolyn Pena, female    DOB: Oct 11, 1975, 40 y.o.   MRN: [id number] No chief complaint on file.   HPI Yesterday her pinky toe was bled profusely and was cleaned up 24 hrs ago. Has stopped bleeding but she is worried that the bleeding is going to recur when she stands on her foot as she does not distribute her weight evenly.  Has a laceration on the plantar surface of her toe.  Her sinusitis is started to feel better.  Past Medical History  Diagnosis Date  . Allergy   . Anemia   . Anxiety   . Arthritis   . Asthma   . Depression   . Neuromuscular disorder ([place])   . CP (cerebral palsy) ([place])   . H/O multiple concussions    Past Surgical History  Procedure Laterality Date  . Eye surgery    . Joint replacement     Current Outpatient Prescriptions on File Prior to Visit  Medication Sig Dispense Refill  . albuterol (PROVENTIL HFA;VENTOLIN HFA) 108 (90 Base) MCG/ACT inhaler Inhale 2 puffs into the lungs every 6 (six) hours as needed for wheezing or shortness of breath. 18 g 11  . albuterol (PROVENTIL) (2.5 MG/3ML) 0.083% nebulizer solution Take 3 mLs (2.5 mg total) by nebulization every 6 (six) hours as needed for wheezing or shortness of breath. 150 mL 1  . benzonatate (TESSALON) 200 MG capsule Take 1 capsule (200 mg total) by mouth 3 (three) times daily as needed for cough. 60 capsule 3  . busPIRone (BUSPAR) 5 MG tablet Take 1 tablet (5 mg total) by mouth 2 (two) times daily. 180 tablet 1  . butalbital-acetaminophen-caffeine (FIORICET, ESGIC) 50-325-40 MG tablet Take 1 tablet by mouth 2 (two) times daily as needed for headache.    . cefdinir (OMNICEF) 300 MG capsule Take 1 capsule (300 mg total) by mouth 2 (two) times daily. 20 capsule 0  . clonazePAM (KLONOPIN) 1 MG tablet Take 1 tablet (1 mg total) by mouth 2 (two) times daily. 60 tablet 5  . diclofenac (VOLTAREN) 50 MG EC tablet Take 50 mg by mouth 2 (two) times daily.    . ergocalciferol (VITAMIN D2)  50000 units capsule Take 1 capsule (50,000 Units total) by mouth once a week. (Patient taking differently: Take 50,000 Units by mouth once a week. Take on sun) 12 capsule 1  . erythromycin (ROMYCIN) ophthalmic ointment Place 1 application into the right eye 2 (two) times daily. 3.5 g 0  . esomeprazole (NEXIUM) 40 MG capsule Take 1 capsule (40 mg total) by mouth daily at 12 noon. 30 capsule 11  . etonogestrel (NEXPLANON) 68 MG IMPL implant 1 each by Subdermal route once.    . Fluticasone-Salmeterol (ADVAIR) 100-50 MCG/DOSE AEPB Inhale 1 puff into the lungs 2 (two) times daily. 60 each 5  . furosemide (LASIX) 40 MG tablet Take 1 tablet (40 mg total) by mouth daily. 30 tablet 3  . gabapentin (NEURONTIN) 300 MG capsule Take 2 capsules (600 mg total) by mouth 3 (three) times daily. 540 capsule 1  . HYDROmorphone (DILAUDID) 4 MG tablet Take 1 tablet (4 mg total) by mouth every 4 (four) hours as needed for severe pain. 40 tablet 0  . ibuprofen (ADVIL,MOTRIN) 800 MG tablet Take 1 tablet (800 mg total) by mouth every 8 (eight) hours as needed for moderate pain. 30 tablet 0  . ketorolac (TORADOL) 30 MG/ML injection Inject 1 mL (30 mg total) into the muscle  once. 10 mL 2  . L-Methylfolate (DEPLIN) 15 MG TABS Take 15 mg by mouth daily. 30 tablet 5  . levocetirizine (XYZAL) 5 MG tablet Take 5 mg by mouth every evening.    . methocarbamol (ROBAXIN) 500 MG tablet Take 500 mg by mouth 3 (three) times daily.    . mupirocin ointment (BACTROBAN) 2 % Apply 1 application topically 2 (two) times daily. 30 g 1  . ondansetron (ZOFRAN-ODT) 4 MG disintegrating tablet Take 1-2 tablets (4-8 mg total) by mouth every 8 (eight) hours as needed for nausea or vomiting. 30 tablet 2  . polyethylene glycol powder (GLYCOLAX/MIRALAX) powder Take 17 g by mouth daily. 500 g 1  . potassium chloride SA (K-DUR,KLOR-CON) 20 MEQ tablet Take 1 tablet (20 mEq total) by mouth daily. 30 tablet 3  . predniSONE (DELTASONE) 20 MG tablet 3 tabs po qd  x 3d, 2 tabs po qd x 3d, 1 tab po qd x 3d 18 tablet 0  . predniSONE (DELTASONE) 5 MG tablet Take 1 tablet (5 mg total) by mouth daily with breakfast. 30 tablet 1  . promethazine-dextromethorphan (PROMETHAZINE-DM) 6.25-15 MG/5ML syrup Take 5 mLs by mouth 4 (four) times daily as needed for cough. 180 mL 0  . rizatriptan (MAXALT) 5 MG tablet Take 1 tablet (5 mg total) by mouth as needed for migraine. 10 tablet 7  . sertraline (ZOLOFT) 100 MG tablet Take 1.5 tablets (150 mg total) by mouth daily. 135 tablet 0  . traMADol (ULTRAM) 50 MG tablet Take 1 tablet (50 mg total) by mouth every 6 (six) hours as needed. (Patient taking differently: Take 50 mg by mouth every 6 (six) hours as needed for moderate pain. ) 120 tablet 0  . traZODone (DESYREL) 50 MG tablet Take 1-3 tablets (50-150 mg total) by mouth at bedtime as needed. 30 tablet 3   No current facility-administered medications on file prior to visit.   Allergies  Allergen Reactions  . Codeine Nausea And Vomiting  . Lidocaine Swelling    Had steroid injection doesn't know if it was the steroid or lidocaine. "Whole arm swelled & warm to touch. Severe inflamation."   . Lortab [Hydrocodone-Acetaminophen] Nausea And Vomiting  . Augmentin [Amoxicillin-Pot Clavulanate] Nausea And Vomiting  . Methylprednisolone Rash    Had lidocaine as well   Family History  Problem Relation Age of Onset  . Cancer Mother     CLL, decsd in 2012   Social History   Social History  . Marital Status: Single    Spouse Name: N/A  . Number of Children: N/A  . Years of Education: N/A   Social History Main Topics  . Smoking status: Never Smoker   . Smokeless tobacco: Not on file  . Alcohol Use: Not on file  . Drug Use: No  . Sexual Activity: Not on file   Other Topics Concern  . Not on file   Social History Narrative   Pt has doctorate degree      Review of Systems  Constitutional: Positive for activity change. Negative for fever, chills and unexpected  weight change.  HENT: Positive for postnasal drip, rhinorrhea and sinus pressure.   Eyes: Positive for visual disturbance.  Musculoskeletal: Positive for myalgias, back pain, joint swelling, arthralgias and gait problem.  Skin: Positive for wound. Negative for color change, pallor and rash.  Allergic/Immunologic: Positive for immunocompromised state.  Neurological: Positive for headaches. Negative for weakness and numbness.  Hematological: Negative for adenopathy. Does not bruise/bleed easily.  Psychiatric/Behavioral: Positive  for sleep disturbance.       Objective:  LMP 05/30/2015  Physical Exam  Constitutional: She is oriented to person, place, and time. Vital signs are normal. She appears well-developed and well-nourished. She appears ill. No distress.  HENT:  Head: Normocephalic and atraumatic.  Right Ear: External ear normal.  Eyes: Conjunctivae are normal. No scleral icterus.  Pulmonary/Chest: Effort normal.  Neurological: She is alert and oriented to person, place, and time.  Skin: Skin is warm and dry. Laceration noted. She is not diaphoretic. No erythema.  Laceration on planar surface of right 5th toe at MTP area, no surrounding erythema, inflammation.  VERY tender, hemostatic.  Psychiatric: She has a normal mood and affect. Her behavior is normal.       Assessment & Plan:   1. Laceration of fifth toe of right foot with complication, initial encounter   2. Acute recurrent sinusitis, unspecified location   5th right toe is fractured - will place pt in post-op shoe.   Laceration is large but is at flexor surface along Langerhan's lines so is veyr well approximated so will refrain from suture or medical adhesive since >24 hrs since laceration.  Wound care discused in detail w/ pt and her caregiver.   Meds ordered this encounter  Medications  . lidocaine (XYLOCAINE) 5 % ointment    Sig: Apply 1 application topically daily as needed.    Dispense:  50 g    Refill:  0      [name], MD MPH

## 2015-06-22 MED ORDER — LIDOCAINE 5 % EX OINT: 1.0000 "application " | TOPICAL_OINTMENT | Freq: Every day | CUTANEOUS | Status: DC | PRN

## 2015-06-25 ENCOUNTER — Ambulatory Visit: Payer: BLUE CROSS/BLUE SHIELD | Admitting: Family Medicine

## 2015-06-30 ENCOUNTER — Telehealth: Payer: Self-pay

## 2015-06-30 NOTE — Telephone Encounter (Signed)
Botox approval fax received. Botox approved for 2 visits. Reference number [id number].

## 2015-07-02 ENCOUNTER — Ambulatory Visit (INDEPENDENT_AMBULATORY_CARE_PROVIDER_SITE_OTHER): Payer: BLUE CROSS/BLUE SHIELD | Admitting: Neurology

## 2015-07-02 ENCOUNTER — Encounter: Payer: Self-pay | Admitting: Neurology

## 2015-07-02 VITALS — BP 126/80 | HR 77

## 2015-07-02 DIAGNOSIS — G43709 Chronic migraine without aura, not intractable, without status migrainosus: Secondary | ICD-10-CM | POA: Diagnosis not present

## 2015-07-02 DIAGNOSIS — F0781 Postconcussional syndrome: Secondary | ICD-10-CM | POA: Diagnosis not present

## 2015-07-02 MED ORDER — GABAPENTIN 300 MG PO CAPS: 600.0000 mg | ORAL_CAPSULE | Freq: Three times a day (TID) | ORAL | Status: DC

## 2015-07-02 MED ORDER — ONDANSETRON 4 MG PO TBDP: 4.0000 mg | ORAL_TABLET | Freq: Three times a day (TID) | ORAL | Status: DC | PRN

## 2015-07-02 MED ORDER — RIZATRIPTAN BENZOATE 5 MG PO TABS: 5.0000 mg | ORAL_TABLET | ORAL | Status: DC | PRN

## 2015-07-02 MED ORDER — ATENOLOL 25 MG PO TABS: 25.0000 mg | ORAL_TABLET | Freq: Every day | ORAL | Status: DC

## 2015-07-02 NOTE — Progress Notes (Signed)
NEUROLOGY FOLLOW UP OFFICE NOTE  Carolyn Pena [id number]  HISTORY OF PRESENT ILLNESS: Carolyn Pena is a 40 year old left-handed female with cerebral palsy, depression, anxiety, complex regional pain syndrome, asthma and autoimmune disorder, whom I see for migraines now presents for postconcussion syndrome.  History obtained by patient and ED note.  Imaging of head/maxillofacial/cervical CT personally reviewed.  On 06/09/15, she fell at work, hitting her face on the concrete and brick floor.  She thinks she passed out for 5 minutes.  She sustained laceration and swelling around the right eye and temple.  She presented to the ED where CT of head and maxillofacial revealed no skull/orbital/facial fracture and no acute intracranial process.  Since then, she has been experiencing constant flashes and floaters in the right eye.  She has daily right temporal and retro-orbital pain, lasting 3 hours a day.  There is associated nausea.  She has taken ibuprofen, hydromorphone and Zofran.  She sometimes takes Fioricet.  She also notes increased fatigue.  She had been out of work until recently and now works 3 to 4 hours a day.  She saw opthalmology but has to return due to significant eye swelling at that time.  Visual symptoms considered to be detached retina or sequelae of concussion.  She has noted some improvement.  PAST MEDICAL HISTORY: Past Medical History  Diagnosis Date  . Allergy   . Anemia   . Anxiety   . Arthritis   . Asthma   . Depression   . Neuromuscular disorder ([place])   . CP (cerebral palsy) ([place])   . H/O multiple concussions     MEDICATIONS: Current Outpatient Prescriptions on File Prior to Visit  Medication Sig Dispense Refill  . albuterol (PROVENTIL HFA;VENTOLIN HFA) 108 (90 Base) MCG/ACT inhaler Inhale 2 puffs into the lungs every 6 (six) hours as needed for wheezing or shortness of breath. 18 g 11  . albuterol (PROVENTIL) (2.5 MG/3ML) 0.083% nebulizer solution Take 3 mLs  (2.5 mg total) by nebulization every 6 (six) hours as needed for wheezing or shortness of breath. 150 mL 1  . benzonatate (TESSALON) 200 MG capsule Take 1 capsule (200 mg total) by mouth 3 (three) times daily as needed for cough. 60 capsule 3  . busPIRone (BUSPAR) 5 MG tablet Take 1 tablet (5 mg total) by mouth 2 (two) times daily. 180 tablet 1  . cefdinir (OMNICEF) 300 MG capsule Take 1 capsule (300 mg total) by mouth 2 (two) times daily. 20 capsule 0  . clonazePAM (KLONOPIN) 1 MG tablet Take 1 tablet (1 mg total) by mouth 2 (two) times daily. 60 tablet 5  . diclofenac (VOLTAREN) 50 MG EC tablet Take 50 mg by mouth 2 (two) times daily.    . ergocalciferol (VITAMIN D2) 50000 units capsule Take 1 capsule (50,000 Units total) by mouth once a week. (Patient taking differently: Take 50,000 Units by mouth once a week. Take on sun) 12 capsule 1  . erythromycin (ROMYCIN) ophthalmic ointment Place 1 application into the right eye 2 (two) times daily. 3.5 g 0  . esomeprazole (NEXIUM) 40 MG capsule Take 1 capsule (40 mg total) by mouth daily at 12 noon. 30 capsule 11  . etonogestrel (NEXPLANON) 68 MG IMPL implant 1 each by Subdermal route once.    . Fluticasone-Salmeterol (ADVAIR) 100-50 MCG/DOSE AEPB Inhale 1 puff into the lungs 2 (two) times daily. 60 each 5  . furosemide (LASIX) 40 MG tablet Take 1 tablet (40 mg total) by mouth  daily. 30 tablet 3  . HYDROmorphone (DILAUDID) 4 MG tablet Take 1 tablet (4 mg total) by mouth every 4 (four) hours as needed for severe pain. 40 tablet 0  . ibuprofen (ADVIL,MOTRIN) 800 MG tablet Take 1 tablet (800 mg total) by mouth every 8 (eight) hours as needed for moderate pain. 30 tablet 0  . ketorolac (TORADOL) 30 MG/ML injection Inject 1 mL (30 mg total) into the muscle once. 10 mL 2  . L-Methylfolate (DEPLIN) 15 MG TABS Take 15 mg by mouth daily. 30 tablet 5  . levocetirizine (XYZAL) 5 MG tablet Take 5 mg by mouth every evening.    . lidocaine (XYLOCAINE) 5 % ointment  Apply 1 application topically daily as needed. 50 g 0  . methocarbamol (ROBAXIN) 500 MG tablet Take 500 mg by mouth 3 (three) times daily.    . mupirocin ointment (BACTROBAN) 2 % Apply 1 application topically 2 (two) times daily. 30 g 1  . polyethylene glycol powder (GLYCOLAX/MIRALAX) powder Take 17 g by mouth daily. 500 g 1  . potassium chloride SA (K-DUR,KLOR-CON) 20 MEQ tablet Take 1 tablet (20 mEq total) by mouth daily. 30 tablet 3  . predniSONE (DELTASONE) 5 MG tablet Take 1 tablet (5 mg total) by mouth daily with breakfast. 30 tablet 1  . promethazine-dextromethorphan (PROMETHAZINE-DM) 6.25-15 MG/5ML syrup Take 5 mLs by mouth 4 (four) times daily as needed for cough. 180 mL 0  . sertraline (ZOLOFT) 100 MG tablet Take 1.5 tablets (150 mg total) by mouth daily. 135 tablet 0  . traMADol (ULTRAM) 50 MG tablet Take 1 tablet (50 mg total) by mouth every 6 (six) hours as needed. (Patient taking differently: Take 50 mg by mouth every 6 (six) hours as needed for moderate pain. ) 120 tablet 0  . traZODone (DESYREL) 50 MG tablet Take 1-3 tablets (50-150 mg total) by mouth at bedtime as needed. 30 tablet 3   No current facility-administered medications on file prior to visit.    ALLERGIES: Allergies  Allergen Reactions  . Codeine Nausea And Vomiting  . Lidocaine Swelling    Had steroid injection doesn't know if it was the steroid or lidocaine. "Whole arm swelled & warm to touch. Severe inflamation."   . Lortab [Hydrocodone-Acetaminophen] Nausea And Vomiting  . Augmentin [Amoxicillin-Pot Clavulanate] Nausea And Vomiting  . Methylprednisolone Rash    Had lidocaine as well    FAMILY HISTORY: Family History  Problem Relation Age of Onset  . Cancer Mother     CLL, decsd in 2012    SOCIAL HISTORY: Social History   Social History  . Marital Status: Single    Spouse Name: N/A  . Number of Children: N/A  . Years of Education: N/A   Occupational History  . Not on file.   Social History  Main Topics  . Smoking status: Never Smoker   . Smokeless tobacco: Not on file  . Alcohol Use: Not on file  . Drug Use: No  . Sexual Activity: Not on file   Other Topics Concern  . Not on file   Social History Narrative   Pt has doctorate degree    REVIEW OF SYSTEMS: Constitutional: No fevers, chills, or sweats, no generalized fatigue, change in appetite Eyes: No visual changes, double vision, eye pain Ear, nose and throat: No hearing loss, ear pain, nasal congestion, sore throat Cardiovascular: No chest pain, palpitations Respiratory:  No shortness of breath at rest or with exertion, wheezes GastrointestinaI: No nausea, vomiting, diarrhea, abdominal pain,  fecal incontinence Genitourinary:  No dysuria, urinary retention or frequency Musculoskeletal:  No neck pain, back pain Integumentary: No rash, pruritus, skin lesions Neurological: as above Psychiatric: No depression, insomnia, anxiety Endocrine: No palpitations, fatigue, diaphoresis, mood swings, change in appetite, change in weight, increased thirst Hematologic/Lymphatic:  No anemia, purpura, petechiae. Allergic/Immunologic: no itchy/runny eyes, nasal congestion, recent allergic reactions, rashes  PHYSICAL EXAM: Filed Vitals:   07/02/15 0756  BP: 126/80  Pulse: 77   General: No acute distress.  Patient appears well-groomed.  Head:  Normocephalic/atraumatic Eyes:  Fundi examined but not visualized Neck: supple, no paraspinal tenderness, full range of motion Heart:  Regular rate and rhythm Lungs:  Clear to auscultation bilaterally Back: No paraspinal tenderness Neurological Exam: alert and oriented to person, place, and time. Attention span and concentration intact, recent and remote memory intact, fund of knowledge intact.  Speech fluent and not dysarthric, language intact.  CN II-XII intact. Bulk and tone increased tone in right upper extremity.  Decreased bulk in lower extremities, motor strength 4+/5 right bicep,  tricep and grip.  5/5 left upper extremity.  5-/5 hamstrings.  Otherwise, unable to move lower extremities.  Sensation to light touch intact.  Deep tendon reflexes 3+ in upper extremities and absent in lower extremities.  Finger to nose testing intact.  Non-ambulatory  IMPRESSION: Postconcussion syndrome Chronic migraine  PLAN: 1.  Hold off for Botox at this time.  Instead, will start atenolol 28m daily 2.  Suggested supplements and vitamins to decrease inflammation and headache from concussion 3.  Maxalt and Zofran, but stop Fioricet 4.  Follow up in 3 months.  AMetta Clines DO  CC:  EDelman Cheadle MD

## 2015-07-02 NOTE — Patient Instructions (Addendum)
Recommendations for concussion:  To help improve COGNITIVE function: Using fish oil/omega 3 that is 1000 mg (or roughly 600 mg EPA/DHA), starting as soon as possible after concussion, take: 3 tabs THREE TIMES a day  for the first 3 days, then (you will smell a little, sory) 3 tabs TWICE DAILY  for the next 3 days, then 3 tabs ONCE DAILY  for the next 10 days    To help reduce HEADACHES: Coenzyme Q10 150m ONCE DAILY Riboflavin/Vitamin B2 4033mONCE DAILY Magnesium oxide 40045mNCE - TWICE DAILY May stop after headaches are resolved.                                                                                               To help with INSOMNIA: Melatonin 3-5mg48m BEDTIME       Other medicines to help decrease inflammation Alpha Lipoic Acid 100mg12mCE DAILY Turmeric 500mg 2me daily Iron 65mg e25mntal daily Vitamin D 4000 IU daily for 2 weeks then 2000 IU daily thereafter.  For migraines: 1.  Start atenolol 25mg da23m  Caution for lightheadedness.  Call in 4 weeks with update and we can increase dose if needed. 2.  Continue gabapentin 600mg thr57mimes daily 3.  Continue Maxalt and Zofran but stop Fioricet  CALL IN 4 WEEKS WITH UPDATE.  FOLLOW UP IN 3 MONTHS.  WE WILL HOLD OFF ON BOTOX FOR NOW

## 2015-07-14 ENCOUNTER — Encounter: Payer: Self-pay | Admitting: Family Medicine

## 2015-07-17 ENCOUNTER — Encounter (INDEPENDENT_AMBULATORY_CARE_PROVIDER_SITE_OTHER): Payer: Worker's Compensation | Admitting: Ophthalmology

## 2015-07-17 DIAGNOSIS — D3132 Benign neoplasm of left choroid: Secondary | ICD-10-CM | POA: Diagnosis not present

## 2015-07-17 DIAGNOSIS — H35413 Lattice degeneration of retina, bilateral: Secondary | ICD-10-CM | POA: Diagnosis not present

## 2015-07-17 DIAGNOSIS — H43813 Vitreous degeneration, bilateral: Secondary | ICD-10-CM | POA: Diagnosis not present

## 2015-07-23 ENCOUNTER — Telehealth: Payer: Self-pay

## 2015-07-23 NOTE — Telephone Encounter (Signed)
Patient faxed over blank disability paperwork that needs to be completed by Dr Brigitte Pulse. I have completed what I could from notes and from the old form that she sent with it from her old doctor in [place]. I will place these forms in your box on 07/23/15 if you could complete them and get them back to me as soon as possible they are to be turned in by 07/30/15 apparently the patient has faxed them over two weeks ago but for some reason they were not given to me and Im not sure where they are now and I just got them today so it is kind of a rush order. Thank you!

## 2015-07-24 NOTE — Telephone Encounter (Signed)
Paperwork scanned and faxed to the number on the paper for the patient on 07/24/15

## 2015-07-25 NOTE — Telephone Encounter (Signed)
Thanks. Papers completed by myself 5/26 a.m.

## 2015-07-28 ENCOUNTER — Telehealth: Payer: Self-pay

## 2015-07-28 DIAGNOSIS — G809 Cerebral palsy, unspecified: Secondary | ICD-10-CM

## 2015-07-28 NOTE — Telephone Encounter (Signed)
Patient stated she needs some form of a RX written by Dr Brigitte Pulse that will allow her insurance company the ability to pay for repairs on her motorized wheelchair stated that something was wrong with the controller, but that she has to have a doctors RX to have it fixed. If you need more information please call the patient, she just briefly mentioned it to me when I called to talk her about her Disability paperwork.  Her call number is [phone]

## 2015-07-29 NOTE — Telephone Encounter (Signed)
Ok to order 

## 2015-07-29 NOTE — Telephone Encounter (Signed)
Yep, fine, thanks

## 2015-07-30 NOTE — Telephone Encounter (Signed)
Left message for pt to call back  °

## 2015-07-30 NOTE — Telephone Encounter (Signed)
Please ask pt if she wants to pick up the rx to have the chair fixed or can we fax it somewhere for her. Then write and I will sign. Thanks.

## 2015-07-30 NOTE — Telephone Encounter (Signed)
Patient contacted me through work email regarding her RX for her chair, I told her that someone tried to call her and left her a message and she stated she did not get anything she wanted to make sure the right number was called. Can someone please call the number below and speak to her about her repairs on her chair. Thank you!  Her call back number is [phone]

## 2015-07-31 NOTE — Telephone Encounter (Signed)
[facility] jp/cma

## 2015-08-04 DIAGNOSIS — G808 Other cerebral palsy: Secondary | ICD-10-CM | POA: Diagnosis not present

## 2015-08-04 DIAGNOSIS — G9050 Complex regional pain syndrome I, unspecified: Secondary | ICD-10-CM | POA: Diagnosis not present

## 2015-08-04 DIAGNOSIS — G43709 Chronic migraine without aura, not intractable, without status migrainosus: Secondary | ICD-10-CM | POA: Diagnosis not present

## 2015-08-04 DIAGNOSIS — Z993 Dependence on wheelchair: Secondary | ICD-10-CM | POA: Diagnosis not present

## 2015-08-05 DIAGNOSIS — G808 Other cerebral palsy: Secondary | ICD-10-CM | POA: Insufficient documentation

## 2015-08-05 NOTE — Telephone Encounter (Signed)
signed in epic

## 2015-08-05 NOTE — Addendum Note (Signed)
Addended by: Jannette Spanner on: 08/05/2015 10:37 AM   Modules accepted: Orders

## 2015-08-05 NOTE — Telephone Encounter (Signed)
[place] spoke with patient, will fax to [phone]. Order pended.

## 2015-08-05 NOTE — Addendum Note (Signed)
Addended by: [name] on: 08/05/2015 11:39 AM   Modules accepted: Orders

## 2015-08-07 NOTE — Telephone Encounter (Signed)
Signed and placed in nurses box at front desk.

## 2015-08-10 NOTE — Telephone Encounter (Signed)
Faxed to patient

## 2015-08-18 DIAGNOSIS — M6281 Muscle weakness (generalized): Secondary | ICD-10-CM | POA: Diagnosis not present

## 2015-08-18 DIAGNOSIS — G90513 Complex regional pain syndrome I of upper limb, bilateral: Secondary | ICD-10-CM | POA: Diagnosis not present

## 2015-08-18 DIAGNOSIS — G800 Spastic quadriplegic cerebral palsy: Secondary | ICD-10-CM | POA: Diagnosis not present

## 2015-08-18 DIAGNOSIS — M150 Primary generalized (osteo)arthritis: Secondary | ICD-10-CM | POA: Diagnosis not present

## 2015-08-26 DIAGNOSIS — G43909 Migraine, unspecified, not intractable, without status migrainosus: Secondary | ICD-10-CM | POA: Diagnosis not present

## 2015-08-26 DIAGNOSIS — G9050 Complex regional pain syndrome I, unspecified: Secondary | ICD-10-CM | POA: Diagnosis not present

## 2015-08-26 DIAGNOSIS — G808 Other cerebral palsy: Secondary | ICD-10-CM | POA: Diagnosis not present

## 2015-08-27 DIAGNOSIS — G894 Chronic pain syndrome: Secondary | ICD-10-CM | POA: Diagnosis not present

## 2015-08-27 DIAGNOSIS — M25511 Pain in right shoulder: Secondary | ICD-10-CM | POA: Diagnosis not present

## 2015-09-03 ENCOUNTER — Telehealth: Payer: Self-pay

## 2015-09-03 NOTE — Telephone Encounter (Signed)
[place] another copy of the placard for disability faxed to her.  She moved and can't find the ones sent earlier.  1)  Handicap placard, 2) License handicap  Please send both.   [phone]

## 2015-09-09 ENCOUNTER — Telehealth: Payer: Self-pay

## 2015-09-09 DIAGNOSIS — R1030 Lower abdominal pain, unspecified: Secondary | ICD-10-CM | POA: Diagnosis not present

## 2015-09-09 DIAGNOSIS — Z136 Encounter for screening for cardiovascular disorders: Secondary | ICD-10-CM | POA: Diagnosis not present

## 2015-09-09 DIAGNOSIS — Z13220 Encounter for screening for lipoid disorders: Secondary | ICD-10-CM | POA: Diagnosis not present

## 2015-09-09 DIAGNOSIS — Z0000 Encounter for general adult medical examination without abnormal findings: Secondary | ICD-10-CM | POA: Diagnosis not present

## 2015-09-09 DIAGNOSIS — Z124 Encounter for screening for malignant neoplasm of cervix: Secondary | ICD-10-CM | POA: Diagnosis not present

## 2015-09-09 NOTE — Telephone Encounter (Signed)
Roselyn Reef from [facility] is calling because a form was faxed for Dr. Brigitte Pulse to fill out and they haven't gotten a response. I advised to faxed again. She states it was faxed sometime in May and July 6th. Please look out for fax!

## 2015-09-10 DIAGNOSIS — R1030 Lower abdominal pain, unspecified: Secondary | ICD-10-CM | POA: Diagnosis not present

## 2015-09-11 NOTE — Telephone Encounter (Signed)
Blank disability paperwork is scanned into the computer and place in Dr. Raul Del box on 09/11/15 please complete and return to the FMLA/Disability box at the 102 checkout desk within 5-7 business days. Thank you!

## 2015-09-17 DIAGNOSIS — G894 Chronic pain syndrome: Secondary | ICD-10-CM | POA: Diagnosis not present

## 2015-09-17 DIAGNOSIS — M25511 Pain in right shoulder: Secondary | ICD-10-CM | POA: Diagnosis not present

## 2015-09-18 NOTE — Telephone Encounter (Signed)
Today is the 5th day on these forms have we had a chance to complete them yet? Please let me know thanks!

## 2015-09-19 ENCOUNTER — Encounter: Payer: Self-pay | Admitting: Family Medicine

## 2015-09-19 NOTE — Telephone Encounter (Signed)
They are in the disability box. Please make sure a copy of the completed form gets scanned in - it appears not to have been when they were done last year. thanksl

## 2015-09-22 NOTE — Telephone Encounter (Signed)
Paperwork scanned and faxed to patient on 09/22/15

## 2015-10-09 DIAGNOSIS — J020 Streptococcal pharyngitis: Secondary | ICD-10-CM | POA: Diagnosis not present

## 2015-10-15 ENCOUNTER — Ambulatory Visit: Payer: Self-pay | Admitting: Neurology

## 2015-11-21 DIAGNOSIS — Z23 Encounter for immunization: Secondary | ICD-10-CM | POA: Diagnosis not present

## 2015-11-24 DIAGNOSIS — J0100 Acute maxillary sinusitis, unspecified: Secondary | ICD-10-CM | POA: Diagnosis not present

## 2015-11-24 DIAGNOSIS — J029 Acute pharyngitis, unspecified: Secondary | ICD-10-CM | POA: Diagnosis not present

## 2015-11-24 DIAGNOSIS — R509 Fever, unspecified: Secondary | ICD-10-CM | POA: Diagnosis not present

## 2015-11-24 DIAGNOSIS — M7989 Other specified soft tissue disorders: Secondary | ICD-10-CM | POA: Diagnosis not present

## 2015-12-11 ENCOUNTER — Telehealth: Payer: Self-pay

## 2015-12-11 NOTE — Telephone Encounter (Signed)
Patient needs an updated disability form completed, the first one we sent in we missed answering a question so they sent it back. I have completed the section that they needed answered and it just needs to be signed. I will place the forms in Dr Raul Del box on 12/11/15 if you could please sign and return them to the FMLA/Disability box at the 102 checkout desk within 5-7 business days. Thank you!

## 2015-12-15 NOTE — Telephone Encounter (Signed)
Completed and will return

## 2015-12-16 DIAGNOSIS — G809 Cerebral palsy, unspecified: Secondary | ICD-10-CM | POA: Diagnosis not present

## 2015-12-16 DIAGNOSIS — R600 Localized edema: Secondary | ICD-10-CM | POA: Diagnosis not present

## 2015-12-21 DIAGNOSIS — R600 Localized edema: Secondary | ICD-10-CM | POA: Diagnosis not present

## 2015-12-21 DIAGNOSIS — G808 Other cerebral palsy: Secondary | ICD-10-CM | POA: Diagnosis not present

## 2015-12-21 DIAGNOSIS — Z993 Dependence on wheelchair: Secondary | ICD-10-CM | POA: Diagnosis not present

## 2015-12-21 DIAGNOSIS — F411 Generalized anxiety disorder: Secondary | ICD-10-CM | POA: Diagnosis not present

## 2015-12-28 ENCOUNTER — Telehealth: Payer: Self-pay

## 2015-12-28 NOTE — Telephone Encounter (Signed)
Pt has not followed up and cancelled f/u she had scheduled. Will close encounter w/o preauthorizing Botox since she has not continued care.

## 2015-12-28 NOTE — Telephone Encounter (Signed)
-----   Message from Amada Kingfisher, [place] sent at 06/30/2015  7:26 AM EDT ----- Do Botox reauthorization by calling [phone] or fax updated clinical notes to [phone]

## 2016-01-19 DIAGNOSIS — J4521 Mild intermittent asthma with (acute) exacerbation: Secondary | ICD-10-CM | POA: Diagnosis not present

## 2016-01-19 DIAGNOSIS — J029 Acute pharyngitis, unspecified: Secondary | ICD-10-CM | POA: Diagnosis not present

## 2016-01-19 DIAGNOSIS — J40 Bronchitis, not specified as acute or chronic: Secondary | ICD-10-CM | POA: Diagnosis not present

## 2016-01-19 DIAGNOSIS — R05 Cough: Secondary | ICD-10-CM | POA: Diagnosis not present

## 2016-02-08 DIAGNOSIS — R05 Cough: Secondary | ICD-10-CM | POA: Diagnosis not present

## 2016-02-08 DIAGNOSIS — J4541 Moderate persistent asthma with (acute) exacerbation: Secondary | ICD-10-CM | POA: Diagnosis not present

## 2016-02-12 DIAGNOSIS — J4541 Moderate persistent asthma with (acute) exacerbation: Secondary | ICD-10-CM | POA: Diagnosis not present

## 2016-02-25 ENCOUNTER — Other Ambulatory Visit: Payer: Self-pay | Admitting: Nurse Practitioner

## 2016-02-25 DIAGNOSIS — Z1231 Encounter for screening mammogram for malignant neoplasm of breast: Secondary | ICD-10-CM

## 2016-04-12 DIAGNOSIS — R05 Cough: Secondary | ICD-10-CM | POA: Diagnosis not present

## 2016-04-12 DIAGNOSIS — J069 Acute upper respiratory infection, unspecified: Secondary | ICD-10-CM | POA: Diagnosis not present

## 2016-04-12 DIAGNOSIS — J0100 Acute maxillary sinusitis, unspecified: Secondary | ICD-10-CM | POA: Diagnosis not present

## 2016-04-12 DIAGNOSIS — J029 Acute pharyngitis, unspecified: Secondary | ICD-10-CM | POA: Diagnosis not present

## 2016-04-13 ENCOUNTER — Other Ambulatory Visit: Payer: Self-pay | Admitting: Family Medicine

## 2016-04-29 DIAGNOSIS — M21272 Flexion deformity, left ankle and toes: Secondary | ICD-10-CM | POA: Diagnosis not present

## 2016-04-29 DIAGNOSIS — M85872 Other specified disorders of bone density and structure, left ankle and foot: Secondary | ICD-10-CM | POA: Diagnosis not present

## 2016-04-29 DIAGNOSIS — M7989 Other specified soft tissue disorders: Secondary | ICD-10-CM | POA: Diagnosis not present

## 2016-05-10 DIAGNOSIS — G809 Cerebral palsy, unspecified: Secondary | ICD-10-CM | POA: Diagnosis not present

## 2016-05-10 DIAGNOSIS — G9050 Complex regional pain syndrome I, unspecified: Secondary | ICD-10-CM | POA: Diagnosis not present

## 2016-05-16 DIAGNOSIS — J029 Acute pharyngitis, unspecified: Secondary | ICD-10-CM | POA: Diagnosis not present

## 2016-05-16 DIAGNOSIS — G5771 Causalgia of right lower limb: Secondary | ICD-10-CM | POA: Diagnosis not present

## 2016-05-16 DIAGNOSIS — Z993 Dependence on wheelchair: Secondary | ICD-10-CM | POA: Diagnosis not present

## 2016-05-16 DIAGNOSIS — R600 Localized edema: Secondary | ICD-10-CM | POA: Diagnosis not present

## 2016-05-16 DIAGNOSIS — G808 Other cerebral palsy: Secondary | ICD-10-CM | POA: Diagnosis not present

## 2016-05-16 DIAGNOSIS — G5772 Causalgia of left lower limb: Secondary | ICD-10-CM | POA: Diagnosis not present

## 2016-05-23 ENCOUNTER — Other Ambulatory Visit (HOSPITAL_COMMUNITY): Payer: Self-pay | Admitting: Orthopedic Surgery

## 2016-05-23 DIAGNOSIS — M810 Age-related osteoporosis without current pathological fracture: Secondary | ICD-10-CM | POA: Diagnosis not present

## 2016-05-23 DIAGNOSIS — M79605 Pain in left leg: Secondary | ICD-10-CM

## 2016-05-23 DIAGNOSIS — G90522 Complex regional pain syndrome I of left lower limb: Secondary | ICD-10-CM | POA: Diagnosis not present

## 2016-05-23 DIAGNOSIS — G801 Spastic diplegic cerebral palsy: Secondary | ICD-10-CM | POA: Diagnosis not present

## 2016-06-04 ENCOUNTER — Ambulatory Visit (HOSPITAL_COMMUNITY)
Admission: RE | Admit: 2016-06-04 | Discharge: 2016-06-04 | Disposition: A | Discharge status: Home / Self Care | Payer: BLUE CROSS/BLUE SHIELD | Source: Ambulatory Visit | Attending: Orthopedic Surgery | Admitting: Orthopedic Surgery

## 2016-06-04 DIAGNOSIS — R2242 Localized swelling, mass and lump, left lower limb: Secondary | ICD-10-CM | POA: Diagnosis not present

## 2016-06-04 DIAGNOSIS — M79605 Pain in left leg: Secondary | ICD-10-CM | POA: Diagnosis not present

## 2016-06-04 DIAGNOSIS — M7989 Other specified soft tissue disorders: Secondary | ICD-10-CM | POA: Diagnosis not present

## 2016-06-04 DIAGNOSIS — M79662 Pain in left lower leg: Secondary | ICD-10-CM | POA: Diagnosis not present

## 2016-06-04 DIAGNOSIS — M62562 Muscle wasting and atrophy, not elsewhere classified, left lower leg: Secondary | ICD-10-CM | POA: Diagnosis not present

## 2016-06-09 ENCOUNTER — Other Ambulatory Visit: Payer: Self-pay | Admitting: Nurse Practitioner

## 2016-06-09 DIAGNOSIS — M8589 Other specified disorders of bone density and structure, multiple sites: Secondary | ICD-10-CM

## 2016-06-27 DIAGNOSIS — R5383 Other fatigue: Secondary | ICD-10-CM | POA: Diagnosis not present

## 2016-06-27 DIAGNOSIS — R197 Diarrhea, unspecified: Secondary | ICD-10-CM | POA: Diagnosis not present

## 2016-06-27 DIAGNOSIS — L89321 Pressure ulcer of left buttock, stage 1: Secondary | ICD-10-CM | POA: Diagnosis not present

## 2016-06-30 DIAGNOSIS — G809 Cerebral palsy, unspecified: Secondary | ICD-10-CM | POA: Diagnosis not present

## 2016-06-30 DIAGNOSIS — G9050 Complex regional pain syndrome I, unspecified: Secondary | ICD-10-CM | POA: Diagnosis not present

## 2016-07-06 ENCOUNTER — Ambulatory Visit
Admission: RE | Admit: 2016-07-06 | Discharge: 2016-07-06 | Disposition: A | Discharge status: Home / Self Care | Payer: BLUE CROSS/BLUE SHIELD | Source: Ambulatory Visit | Attending: Nurse Practitioner | Admitting: Nurse Practitioner

## 2016-07-06 ENCOUNTER — Ambulatory Visit: Payer: BLUE CROSS/BLUE SHIELD

## 2016-07-06 DIAGNOSIS — Z1231 Encounter for screening mammogram for malignant neoplasm of breast: Secondary | ICD-10-CM | POA: Diagnosis not present

## 2016-07-06 DIAGNOSIS — M8588 Other specified disorders of bone density and structure, other site: Secondary | ICD-10-CM | POA: Diagnosis not present

## 2016-07-06 DIAGNOSIS — M8589 Other specified disorders of bone density and structure, multiple sites: Secondary | ICD-10-CM

## 2016-07-07 ENCOUNTER — Other Ambulatory Visit: Payer: BLUE CROSS/BLUE SHIELD

## 2016-07-13 ENCOUNTER — Other Ambulatory Visit: Payer: BLUE CROSS/BLUE SHIELD

## 2016-08-12 DIAGNOSIS — H25013 Cortical age-related cataract, bilateral: Secondary | ICD-10-CM | POA: Diagnosis not present

## 2016-08-12 DIAGNOSIS — H43811 Vitreous degeneration, right eye: Secondary | ICD-10-CM | POA: Diagnosis not present

## 2016-08-12 DIAGNOSIS — H5711 Ocular pain, right eye: Secondary | ICD-10-CM | POA: Diagnosis not present

## 2016-08-12 DIAGNOSIS — H5319 Other subjective visual disturbances: Secondary | ICD-10-CM | POA: Diagnosis not present

## 2016-09-02 ENCOUNTER — Other Ambulatory Visit: Payer: Self-pay | Admitting: Emergency Medicine

## 2016-09-02 ENCOUNTER — Telehealth: Payer: Self-pay | Admitting: Family Medicine

## 2016-09-02 ENCOUNTER — Other Ambulatory Visit: Payer: Self-pay | Admitting: Family Medicine

## 2016-09-02 NOTE — Telephone Encounter (Signed)
Please advise 

## 2016-09-02 NOTE — Telephone Encounter (Signed)
Patient emailed me this email about needing RX for these items. I informed her I ONLY handle FMLA and she should call or send a mychart message to the provider in order to get these items handled. I also told her I would send her message to the provider but she needed to follow up since I did not handle RX.  ""Hi Caitlyn:  I am writing after speaking with my FSA representative and the following items are being denied because while there are medical and ADL supplies and necessities, they explained to me that it only recognizes [employer] on as a Teacher, English as a foreign language. They indicated to include my typical pt. information, diagnosis's (CP), limitations (uses power wheelchair, non ambulating and when it would expiration date i.e. never or PRN?)  I've attached the links to the products I am needing to purpose. They stated while the links were not necessary, the actual brand and description would be needed. She also stated it could be included on one statement (does not have to be separate Rx for each unless it is easier)Once I have this, they can file it via my FSA.  All of them are important, but the first one, shower transfer bench with swivel set, grey is critical. She knows I have been trying to get insurance to cover one for me, but I have had absolutely no luck finding a DME from here to [facility] to fill my DME equipment needs just can't wait any longer, sitting on just the plastic commode is too painful.  If you can rush this ASAP and send me the statement electronically I would be ever so grateful. Please let me know if you have questions.   Insurance underwriter with Swivel Seat [employer]ables)  Sold by: [place] Product question? Ask Seller  $299.00 [place] Adult Washcloths 240ct by [employer]  Sold by: [place]  [phone] Wheelchair Bag for Back of Chair w/ [place] for [employer], Producer, television/film/video, Architectural technologist for [employer], Ambulance person or  Power Wheelchairs ([employer])  Sold by: [place]...  $29.97 Tamala Julian and [employer] Protective Ointment Skin Protectant 5.6oz Tube (Pack of 3)  Sold by: [employer]  $16.27   Bluestone Pregnancy Pillow, Full Body Maternity Pillow with Contoured U-Shape by, Back Support  Sold by: [employer]  $43.94  This item:Secura Extra Protective Cream - 7.75 oz Flip-Top Tube - Pack of 2 $23.4  Smith and [employer] Moisturizing Antimicrobial Skin Cleanser 8oz Spray Bottle (Pack of 3) $11.33  Smith and Nephew SECURA Protective Ointment Skin Protectant 5.6oz Tube (Pack of 3) $16.27($0.97 / ounce)  Please let me know if you have any questions at all and when I can be given an estimated timeline for the letter so I can plan accordingly. All reimbursements orders are placed on Wednesdays. So Mondays or early next week would be best.  Thank you and Dr. Brigitte Pulse in advance!  KAM   Nikeshia A. Franckowiak,Ph.D.""

## 2016-09-05 NOTE — Telephone Encounter (Signed)
Please advise 

## 2016-09-05 NOTE — Telephone Encounter (Signed)
Attempted call. Invalid phone.

## 2016-09-05 NOTE — Telephone Encounter (Addendum)
Pt has a new primary care provider - Gwenlyn Perking - looks like she has transferred her care to [place] at [facility] so need to request there.  We have not seen pt in office in > 1 yr so would need OV before we can take care of any insurance/disability/paperwork requests.

## 2016-09-06 NOTE — Telephone Encounter (Signed)
Sent pt an email to call us back and ask to speak to Jenny Reichmann

## 2016-09-07 DIAGNOSIS — R200 Anesthesia of skin: Secondary | ICD-10-CM | POA: Diagnosis not present

## 2016-09-07 DIAGNOSIS — R05 Cough: Secondary | ICD-10-CM | POA: Diagnosis not present

## 2016-09-07 DIAGNOSIS — R0981 Nasal congestion: Secondary | ICD-10-CM | POA: Diagnosis not present

## 2016-09-07 DIAGNOSIS — J329 Chronic sinusitis, unspecified: Secondary | ICD-10-CM | POA: Diagnosis not present

## 2016-09-07 DIAGNOSIS — R202 Paresthesia of skin: Secondary | ICD-10-CM | POA: Diagnosis not present

## 2016-09-22 DIAGNOSIS — R32 Unspecified urinary incontinence: Secondary | ICD-10-CM | POA: Diagnosis not present

## 2016-09-22 DIAGNOSIS — G808 Other cerebral palsy: Secondary | ICD-10-CM | POA: Diagnosis not present

## 2016-10-07 DIAGNOSIS — G9050 Complex regional pain syndrome I, unspecified: Secondary | ICD-10-CM | POA: Diagnosis not present

## 2016-10-07 DIAGNOSIS — G809 Cerebral palsy, unspecified: Secondary | ICD-10-CM | POA: Diagnosis not present

## 2016-10-11 DIAGNOSIS — K5900 Constipation, unspecified: Secondary | ICD-10-CM | POA: Insufficient documentation

## 2016-10-11 DIAGNOSIS — M2550 Pain in unspecified joint: Secondary | ICD-10-CM | POA: Insufficient documentation

## 2016-10-11 DIAGNOSIS — H9190 Unspecified hearing loss, unspecified ear: Secondary | ICD-10-CM | POA: Insufficient documentation

## 2016-10-11 DIAGNOSIS — R42 Dizziness and giddiness: Secondary | ICD-10-CM | POA: Insufficient documentation

## 2016-10-11 DIAGNOSIS — Z8701 Personal history of pneumonia (recurrent): Secondary | ICD-10-CM | POA: Insufficient documentation

## 2016-10-11 DIAGNOSIS — M6281 Muscle weakness (generalized): Secondary | ICD-10-CM | POA: Insufficient documentation

## 2016-10-11 DIAGNOSIS — H04123 Dry eye syndrome of bilateral lacrimal glands: Secondary | ICD-10-CM | POA: Insufficient documentation

## 2016-10-11 DIAGNOSIS — R5383 Other fatigue: Secondary | ICD-10-CM | POA: Insufficient documentation

## 2016-10-11 HISTORY — DX: Unspecified hearing loss, unspecified ear: H91.90

## 2016-11-02 DIAGNOSIS — R32 Unspecified urinary incontinence: Secondary | ICD-10-CM | POA: Diagnosis not present

## 2016-11-02 DIAGNOSIS — G808 Other cerebral palsy: Secondary | ICD-10-CM | POA: Diagnosis not present

## 2016-12-01 ENCOUNTER — Encounter: Payer: Self-pay | Admitting: Family Medicine

## 2016-12-03 ENCOUNTER — Encounter: Payer: Self-pay | Admitting: Family Medicine

## 2016-12-03 ENCOUNTER — Ambulatory Visit (INDEPENDENT_AMBULATORY_CARE_PROVIDER_SITE_OTHER): Payer: BLUE CROSS/BLUE SHIELD | Admitting: Family Medicine

## 2016-12-03 VITALS — HR 96 | Temp 99.1°F | Resp 16 | Ht 60.0 in

## 2016-12-03 DIAGNOSIS — G90511 Complex regional pain syndrome I of right upper limb: Secondary | ICD-10-CM

## 2016-12-03 DIAGNOSIS — G43909 Migraine, unspecified, not intractable, without status migrainosus: Secondary | ICD-10-CM | POA: Diagnosis not present

## 2016-12-03 DIAGNOSIS — J45909 Unspecified asthma, uncomplicated: Secondary | ICD-10-CM | POA: Diagnosis not present

## 2016-12-03 DIAGNOSIS — L89309 Pressure ulcer of unspecified buttock, unspecified stage: Secondary | ICD-10-CM | POA: Diagnosis not present

## 2016-12-03 DIAGNOSIS — E782 Mixed hyperlipidemia: Secondary | ICD-10-CM

## 2016-12-03 DIAGNOSIS — Z993 Dependence on wheelchair: Secondary | ICD-10-CM

## 2016-12-03 DIAGNOSIS — Z0000 Encounter for general adult medical examination without abnormal findings: Secondary | ICD-10-CM

## 2016-12-03 DIAGNOSIS — Z23 Encounter for immunization: Secondary | ICD-10-CM

## 2016-12-03 DIAGNOSIS — R609 Edema, unspecified: Secondary | ICD-10-CM

## 2016-12-03 DIAGNOSIS — G808 Other cerebral palsy: Secondary | ICD-10-CM | POA: Diagnosis not present

## 2016-12-03 DIAGNOSIS — N912 Amenorrhea, unspecified: Secondary | ICD-10-CM | POA: Diagnosis not present

## 2016-12-03 DIAGNOSIS — E559 Vitamin D deficiency, unspecified: Secondary | ICD-10-CM

## 2016-12-03 DIAGNOSIS — D8989 Other specified disorders involving the immune mechanism, not elsewhere classified: Secondary | ICD-10-CM

## 2016-12-03 DIAGNOSIS — R600 Localized edema: Secondary | ICD-10-CM

## 2016-12-03 LAB — POCT CBC
GRANULOCYTE PERCENT: 74.6 % (ref 37–80)
HCT, POC: 35.5 % — AB (ref 37.7–47.9)
HEMOGLOBIN: 11.6 g/dL — AB (ref 12.2–16.2)
Lymph, poc: 1.8 (ref 0.6–3.4)
MCH: 26.3 pg — AB (ref 27–31.2)
MCHC: 32.8 g/dL (ref 31.8–35.4)
MCV: 80.1 fL (ref 80–97)
MID (cbc): 0.5 (ref 0–0.9)
MPV: 6.9 fL (ref 0–99.8)
PLATELET COUNT, POC: 231 10*3/uL (ref 142–424)
POC Granulocyte: 6.9 (ref 2–6.9)
POC LYMPH PERCENT: 19.6 %L (ref 10–50)
POC MID %: 5.8 % (ref 0–12)
RBC: 4.44 M/uL (ref 4.04–5.48)
RDW, POC: 16.5 %
WBC: 9.2 10*3/uL (ref 4.6–10.2)

## 2016-12-03 LAB — POCT SEDIMENTATION RATE: POCT SED RATE: 20 mm/hr (ref 0–22)

## 2016-12-03 NOTE — Progress Notes (Addendum)
Subjective:  By signing my name below, I, Carolyn Pena, attest that this documentation has been prepared under the direction and in the presence of [name], MD. Electronically Signed: Moises Pena, [place]. 12/03/2016 , 1:58 PM .  Patient was seen in Room 1 .   Patient ID: Carolyn Pena, female    DOB: November 06, 1975, 41 y.o.   MRN: [id number] Chief Complaint  Patient presents with  . Annual Exam   HPI Last CPE done at [facility] by Gwenlyn Perking NP on 09/09/2015. She is fasting today.   Personal home assistance Patient's personal assistant who's been helping her, without warning or notice quit on Sunday Sept 30th. Patient has been without care since, having to sleep in her wheelchair. The agency she was using states they have no one to cover temporarily, so an Rx sent to [facility] for aid and bath aid.   Pressure Sores She also mentions having a pressure sores over her buttocks, believed to be seated too long. She's been applying mupirocin and barrier ointment cream over the area. She hasn't had wound care done locally.   Nebulizer machine She requested new nebulizer machine.   Primary Preventative Screenings: Cervical Cancer: Pap done 09/09/2015 at [place] -Normal with neg HR HPV Family Planning:Nexplanon in place; should be due in February.  STI screening:  Breast Cancer: done on May 9th, 2018; normal. Will forward results another time.  Tobacco use/EtOH/substances: Bone Density: done on May 9th, 2018; showed osteocytopenia, repeat 1 year. Will forward results another time.  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 Chronic migraine without aura without status migrainosus, not intractable 03/10/2015  . Generalized anxiety disorder 02/28/2015  . Uncomplicated asthma 08/24/9483  . Systemic involvement of connective tissue (*) 02/28/2015    Overview:  Chronically elevated c-reactive protein. Has 5/6 of the criteria for lupus and meets some of the rheumatoid arthritic criteria but unable to confirm any diagnosis  . Other cerebral palsy 02/28/2015  . Dorsalgia 02/28/2015  . Complex regional pain syndrome I 02/28/2015  . Gastro-esophageal reflux disease without esophagitis 02/28/2015  . Insomnia 02/28/2015  . Dependent on wheelchair 02/28/2015    Past Medical History:  Diagnosis Date  . Allergy   . Anemia   . Anxiety   . Arthritis   . Asthma   . Chronic paraplegia ([place])   . CP (cerebral palsy) ([place])   . Depression   . H/O multiple concussions   . Neuromuscular disorder ([place])     Prior to Admission medications   Medication Sig Start Date End Date Taking? Authorizing Provider  albuterol (PROVENTIL HFA;VENTOLIN HFA) 108 (90 Base) MCG/ACT inhaler Inhale 2 puffs into the lungs every 6 (six) hours as needed for wheezing or shortness of breath. 05/08/15   Shawnee Knapp, MD  benzonatate (TESSALON) 200 MG capsule Take 1 capsule (200 mg total) by mouth 3 (three) times daily as needed for cough. 05/08/15   Shawnee Knapp, MD  busPIRone (BUSPAR) 5 MG tablet Take 1 tablet (5 mg total) by mouth 2 (two) times daily. 06/11/15   Shawnee Knapp, MD  clonazePAM (KLONOPIN) 1 MG tablet Take 1 tablet (1 mg total) by mouth 2 (two) times daily. 06/11/15   Shawnee Knapp, MD  diclofenac (VOLTAREN) 50 MG EC tablet Take 50 mg by mouth 2 (two) times daily.    [provider]  ergocalciferol (VITAMIN D2) 50000 units  capsule Take 1 capsule (50,000 Units total) by mouth once a week. Patient taking differently: Take 50,000 Units by mouth once a week. Take on sun 04/01/15   Shawnee Knapp, MD  esomeprazole (NEXIUM) 40 MG capsule Take 1 capsule (40 mg total) by mouth daily at 12 noon. 02/18/15   Shawnee Knapp, MD  etonogestrel (NEXPLANON) 68 MG IMPL implant 1 each by Subdermal route once.    [provider]  Fluticasone-Salmeterol (ADVAIR) 100-50 MCG/DOSE  AEPB Inhale 1 puff into the lungs 2 (two) times daily. 05/08/15   Shawnee Knapp, MD  furosemide (LASIX) 40 MG tablet TAKE 1 TABLET(40 MG) BY MOUTH DAILY 04/14/16   Shawnee Knapp, MD  gabapentin (NEURONTIN) 300 MG capsule Take 2 capsules (600 mg total) by mouth 3 (three) times daily. 07/02/15   Tomi Likens, Adam R, DO  HYDROmorphone (DILAUDID) 4 MG tablet Take 1 tablet (4 mg total) by mouth every 4 (four) hours as needed for severe pain. 06/11/15   Shawnee Knapp, MD  ibuprofen (ADVIL,MOTRIN) 800 MG tablet Take 1 tablet (800 mg total) by mouth every 8 (eight) hours as needed for moderate pain. 06/09/15   Milton Ferguson, MD  ketorolac (TORADOL) 30 MG/ML injection Inject 1 mL (30 mg total) into the muscle once. 03/12/15   Shawnee Knapp, MD  levocetirizine (XYZAL) 5 MG tablet Take 5 mg by mouth every evening.    [provider]  methocarbamol (ROBAXIN) 500 MG tablet Take 500 mg by mouth 3 (three) times daily.    [provider]  mupirocin ointment (BACTROBAN) 2 % Apply 1 application topically 2 (two) times daily. 06/15/15   Shawnee Knapp, MD  ondansetron (ZOFRAN-ODT) 4 MG disintegrating tablet Take 1-2 tablets (4-8 mg total) by mouth every 8 (eight) hours as needed for nausea or vomiting. 07/02/15   Tomi Likens, Adam R, DO  polyethylene glycol powder (GLYCOLAX/MIRALAX) powder Take 17 g by mouth daily. 06/15/15   Shawnee Knapp, MD  potassium chloride SA (K-DUR,KLOR-CON) 20 MEQ tablet Take 1 tablet (20 mEq total) by mouth daily. 03/12/15   Shawnee Knapp, MD  predniSONE (DELTASONE) 5 MG tablet Take 1 tablet (5 mg total) by mouth daily with breakfast. 06/11/15   Shawnee Knapp, MD  promethazine-dextromethorphan (PROMETHAZINE-DM) 6.25-15 MG/5ML syrup Take 5 mLs by mouth 4 (four) times daily as needed for cough. 06/18/15   Shawnee Knapp, MD  rizatriptan (MAXALT) 5 MG tablet Take 1 tablet (5 mg total) by mouth as needed for migraine. 07/02/15   Pieter Partridge, DO  sertraline (ZOLOFT) 100 MG tablet Take 1.5 tablets (150 mg total) by mouth  daily. 06/11/15   Shawnee Knapp, MD  traMADol (ULTRAM) 50 MG tablet Take 1 tablet (50 mg total) by mouth every 6 (six) hours as needed. Patient taking differently: Take 50 mg by mouth every 6 (six) hours as needed for moderate pain.  03/12/15   Shawnee Knapp, MD  traZODone (DESYREL) 50 MG tablet Take 1-3 tablets (50-150 mg total) by mouth at bedtime as needed. 03/10/15   Pieter Partridge, DO   Allergies  Allergen Reactions  . Codeine Nausea And Vomiting  . Lidocaine Swelling    Had steroid injection doesn't know if it was the steroid or lidocaine. "Whole arm swelled & warm to touch. Severe inflamation."   . Lortab [Hydrocodone-Acetaminophen] Nausea And Vomiting  . Augmentin [Amoxicillin-Pot Clavulanate] Nausea And Vomiting  . Methylprednisolone Rash    Had lidocaine as well  Past Surgical History:  Procedure Laterality Date  . EYE SURGERY    . JOINT REPLACEMENT     Family History  Problem Relation Age of Onset  . Cancer Mother        CLL, decsd in 2012  . Breast cancer Neg Hx    Social History   Social History  . Marital status: Single    Spouse name: N/A  . Number of children: N/A  . Years of education: N/A   Social History Main Topics  . Smoking status: Never Smoker  . Smokeless tobacco: Never Used  . Alcohol use Not on file  . Drug use: No  . Sexual activity: Not on file   Other Topics Concern  . Not on file   Social History Narrative   Pt has doctorate degree   Depression screen [facility] 2/9 12/03/2016 06/21/2015 06/18/2015 06/15/2015 06/11/2015  Decreased Interest 0 0 0 0 0  Down, Depressed, Hopeless 0 0 0 0 0  PHQ - 2 Score 0 0 0 0 0    Review of Systems  Constitutional: Positive for activity change. Negative for chills, fever and unexpected weight change.  Respiratory: Negative for cough.   Gastrointestinal: Positive for nausea. Negative for constipation, diarrhea and vomiting.  Musculoskeletal: Positive for arthralgias, back pain, gait problem, joint swelling and  myalgias.  Skin: Positive for color change, pallor, rash and wound.  Neurological: Positive for weakness and headaches. Negative for dizziness.  Hematological: Bruises/bleeds easily.  Psychiatric/Behavioral: Positive for sleep disturbance.  All other systems reviewed and are negative. if not noted in HPI     Objective:   Physical Exam  Constitutional: She is oriented to person, place, and time. She appears well-developed and well-nourished. No distress.  HENT:  Head: Normocephalic and atraumatic.  Right Ear: Tympanic membrane is erythematous.  Left Ear: Tympanic membrane is erythematous.  Nose: Nose normal.  Eyes: Pupils are equal, round, and reactive to light. EOM are normal.  Neck: Neck supple.  Cardiovascular: Regular rhythm.  Tachycardia present.   Pulmonary/Chest: Effort normal and breath sounds normal. No respiratory distress. She has no decreased breath sounds.  Musculoskeletal: Normal range of motion.  Hypersensitivity in her feet; 3+ non pitting edema in left, and 2+ in the right  Neurological: She is alert and oriented to person, place, and time. She displays atrophy. She exhibits abnormal muscle tone.  In power wheelchair. Severe spasticity of B lower ext.  Skin: Skin is warm and dry.  Psychiatric: She has a normal mood and affect. Her behavior is normal.  Nursing note and vitals reviewed.   Pulse 96   Temp 99.1 F (37.3 C)   Resp 16   Ht 5' (1.524 m)   LMP 11/07/2016   SpO2 95%      Assessment & Plan:   1. Annual physical exam - work form signed. Preventative screenings are UTD through [facility]  2. Need for prophylactic vaccination and inoculation against influenza   3. Pressure injury of skin of buttock, unspecified injury stage, unspecified laterality - needs home health RN and aide ASAP to help her transfer/position to avoid worsening as well as to eval and treat ulcers prn - having stage 1 ulcers along bilateral gluteal cleft with starting to dev stage 2 in a  small area.    4. Peripheral edema - severe dependence in B lower ext from being in wheelchair and unable to get transferred into bed at night. Very painful.  5. Persistent asthma without complication, unspecified asthma  severity   6. 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  NEEDS A BATH AIDE.  Has been working with [facility] but needs new referral placed for health health nurse for wound care (pressure sores and edema) AND an aide for bed bath. Malini suggests trying [facility] if [facility] does not currently have sufficient staff to provider her services that are needed IMMEDIATELY.  Does have spasticity which responds some to Robaxin. Could not tolerate baclofen. Soma and Flexeril ineffective.   7. Autoimmune disorder ([place])   8. Wheelchair dependent   9. Vitamin D deficiency   10. Mixed hyperlipidemia   11.    Absence of menses due to use of contraceptive - on Nexplanon, placed 03/2014 so will need to be replaced in next 4 mo, wil check to see if we need a PA with her insurance, than can sched w/ me for this. needs to continue as hygiene and menstrual complications a SEVERE concern w/ hemiplegic cerebral palsy. 12.    Complex regional pain syndrome - RUE - 2015-2016 13.    Chronic migraine - saw neurology in [place]. Relieved by Maxalt or else Fioricet. Topamax prevention tried prior was ineffective. Continue gabapentin 600 mg 3 times a day. Did have sleep study 12/2013. Orders Placed This Encounter  Procedures  . DME Nebulizer machine    To [facility]    Order Specific Question:   Patient needs a nebulizer to treat with the following condition    Answer:   Asthma [745110]  . Flu Vaccine QUAD 6+ mos PF IM (Fluarix Quad PF)  . Comprehensive metabolic panel    Order Specific Question:   Has the patient fasted?    Answer:   Yes    . Lipid panel    Order Specific Question:   Has the patient fasted?    Answer:   Yes  . TSH  . VITAMIN D 25 Hydroxy (Vit-D Deficiency, Fractures)  . Ambulatory referral to Home Health    Referral Priority:   Urgent    Referral Type:   Home Health Care    Referral Reason:   Specialty Services Required    Requested Specialty:   [place]    Number of Visits Requested:   1  . POCT CBC  . POCT SEDIMENTATION RATE   Not sure if pt needs med refills or what - did not have time to address today due to other issues.  She has been stable on same med regimen so ok to refill anything x 6 mos. Paper rx handwritten for nitrile latex free powder free disposable large gloves 100 box refill prn - needs so others can help her with her gluteal pressure ulcers.  Pt will plan to transfer her care back to [facility] in Feb 2019 when they have a new full time physician there since they take her medicaid but I will be happy to provide care in the interim.  I personally performed the services described in this documentation, which was scribed in my presence. The recorded information has been reviewed and considered, and addended by me as needed.   [name], M.D.  Primary Care at [facility] [address] [place], [place] [address] [phone] phone [phone] fax  12/06/16 10:07 AM

## 2016-12-03 NOTE — Progress Notes (Deleted)
   Subjective:    Patient ID: Carolyn Pena, female    DOB: Aug 06, 1975, 41 y.o.   MRN: [id number]  HPI Last CPE done at [facility] by Gwenlyn Perking NP on 09/09/2015.  Primary Preventative Screenings: Cervical Cancer: Pap done 09/09/2015 at [facility] -Normal with neg HR HPV Family Planning:Nexplanon in place.  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 Chronic migraine without aura without status migrainosus, not intractable 03/10/2015  . Generalized anxiety disorder 02/28/2015  . Uncomplicated asthma 65/78/4696  . Systemic involvement of connective tissue (*) 02/28/2015  Overview:  Chronically elevated c-reactive protein. Has 5/6 of the criteria for lupus and meets some of the rheumatoid arthritic criteria but unable to confirm any diagnosis  . Other cerebral palsy 02/28/2015  . Dorsalgia 02/28/2015  . Complex regional pain syndrome I 02/28/2015  . Gastro-esophageal reflux disease without esophagitis 02/28/2015  . Insomnia 02/28/2015  . Dependent on wheelchair 02/28/2015     Review of Systems     Objective:   Physical Exam    1. Annual physical exam   2. Need for prophylactic vaccination and inoculation against influenza   3. Pressure injury of skin of buttock, unspecified injury stage, unspecified laterality   4. Peripheral edema   5. Persistent asthma without complication, unspecified asthma severity   6. 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 Autoimmune disorder ([place])   8. Wheelchair dependent   9. Vitamin D deficiency        Assessment & Plan:  Vira Agar, [facility], fax [phone] requesting a home health nurse for wound care (pressure sores and edema) AND an aide for bed bath.  Or wants [place] to try  [facility]. . . .   Also requested: "I need an Rx for the gloves. (These are the gloves I ordered from [employer]. You can send that Rx to me electronically):   [email or web address]   "You do not need a RX for Home Health. Your doctor will send over the order to the [employer] agency. Just explain to your MD that you do not have any hands on support and that you have the pressure sores and are really in need of Home Health and a bath aide currently".    ***Place telephone call to get Nexplanon inserted  Lipids slightly elevated. No prior tsh,

## 2016-12-03 NOTE — Patient Instructions (Signed)
     IF you received an x-ray today, you will receive an invoice from [facility]. Please contact [facility] at [phone] with questions or concerns regarding your invoice.   IF you received labwork today, you will receive an invoice from [facility]. Please contact [facility] at [phone] with questions or concerns regarding your invoice.   Our billing staff will not be able to assist you with questions regarding bills from these companies.  You will be contacted with the lab results as soon as they are available. The fastest way to get your results is to activate your My Chart account. Instructions are located on the last page of this paperwork. If you have not heard from us regarding the results in 2 weeks, please contact this office.     

## 2016-12-05 LAB — COMPREHENSIVE METABOLIC PANEL
A/G RATIO: 2.1 (ref 1.2–2.2)
ALBUMIN: 4.5 g/dL (ref 3.5–5.5)
ALT: 13 IU/L (ref 0–32)
AST: 13 IU/L (ref 0–40)
Alkaline Phosphatase: 69 IU/L (ref 39–117)
BILIRUBIN TOTAL: 0.4 mg/dL (ref 0.0–1.2)
BUN / CREAT RATIO: 15 (ref 9–23)
BUN: 9 mg/dL (ref 6–24)
CHLORIDE: 100 mmol/L (ref 96–106)
CO2: 23 mmol/L (ref 20–29)
Calcium: 9.2 mg/dL (ref 8.7–10.2)
Creatinine, Ser: 0.61 mg/dL (ref 0.57–1.00)
GFR, EST AFRICAN AMERICAN: 129 mL/min/{1.73_m2} (ref >59)
GFR, EST NON AFRICAN AMERICAN: 112 mL/min/{1.73_m2} (ref >59)
GLOBULIN, TOTAL: 2.1 g/dL (ref 1.5–4.5)
Glucose: 80 mg/dL (ref 65–99)
POTASSIUM: 3.6 mmol/L (ref 3.5–5.2)
SODIUM: 140 mmol/L (ref 134–144)
TOTAL PROTEIN: 6.6 g/dL (ref 6.0–8.5)

## 2016-12-05 LAB — VITAMIN D 25 HYDROXY (VIT D DEFICIENCY, FRACTURES): VIT D 25 HYDROXY: 19.5 ng/mL — AB (ref 30.0–100.0)

## 2016-12-05 LAB — LIPID PANEL
Chol/HDL Ratio: 7.5 ratio — ABNORMAL HIGH (ref 0.0–4.4)
Cholesterol, Total: 195 mg/dL (ref 100–199)
HDL: 26 mg/dL — ABNORMAL LOW (ref >39)
LDL Calculated: 119 mg/dL — ABNORMAL HIGH (ref 0–99)
Triglycerides: 250 mg/dL — ABNORMAL HIGH (ref 0–149)
VLDL Cholesterol Cal: 50 mg/dL — ABNORMAL HIGH (ref 5–40)

## 2016-12-05 LAB — TSH: TSH: 2.27 u[IU]/mL (ref 0.450–4.500)

## 2016-12-06 ENCOUNTER — Encounter: Payer: Self-pay | Admitting: Family Medicine

## 2016-12-06 ENCOUNTER — Telehealth: Payer: Self-pay | Admitting: Family Medicine

## 2016-12-06 NOTE — Telephone Encounter (Signed)
Pt needs her nexplanon replaced - expires 03/2017. Do we need prior auth for new? Thanks!

## 2016-12-07 ENCOUNTER — Telehealth: Payer: Self-pay

## 2016-12-07 NOTE — Telephone Encounter (Signed)
Dr. Brigitte Pulse, Called patient's only insurance on file which was through her [place] and they stated that the nexplanon is not covered.  They mentioned that patient may have additional insurance.  I tried to reach patient but could not. (I assumed she is probably in class at this time)   It is odd to me that it is not covered through the [facility] Options plan because usually they cover 80% and patient must cover 20%. I will do whatever you need, just let me know. Dakota Vanwart

## 2016-12-09 NOTE — Telephone Encounter (Signed)
I don't know what else to do. Just let pt know and see if she has any suggestions.  Did they mention alternatives to the nexplanon? Will the cover Depo-Provera?  Or an IUD?  Maybe if pt is seen at a clinic that accepts Medicaid, her medicaid would cover it?  Would she like me to refer her to the [facility] clinic at [facility] to see (they take medicaid).  Marland KitchenMarland Kitchen

## 2016-12-10 ENCOUNTER — Other Ambulatory Visit: Payer: Self-pay | Admitting: Family Medicine

## 2016-12-10 MED ORDER — SERTRALINE HCL 100 MG PO TABS: 200.0000 mg | ORAL_TABLET | Freq: Every day | ORAL | 0 refills | Status: DC

## 2016-12-10 MED ORDER — ALBUTEROL SULFATE (2.5 MG/3ML) 0.083% IN NEBU: 2.5000 mg | INHALATION_SOLUTION | RESPIRATORY_TRACT | 5 refills | Status: DC | PRN

## 2016-12-10 MED ORDER — FUROSEMIDE 40 MG PO TABS: 40.0000 mg | ORAL_TABLET | Freq: Two times a day (BID) | ORAL | 0 refills | Status: DC

## 2016-12-10 MED ORDER — POTASSIUM CHLORIDE CRYS ER 20 MEQ PO TBCR: 20.0000 meq | EXTENDED_RELEASE_TABLET | Freq: Two times a day (BID) | ORAL | 0 refills | Status: DC

## 2016-12-10 NOTE — Addendum Note (Signed)
Addended by: Shawnee Knapp on: 12/10/2016 02:27 AM   Modules accepted: Orders

## 2016-12-12 NOTE — Telephone Encounter (Signed)
Tried to call patient, but phone number was not working at this time.

## 2016-12-13 NOTE — Telephone Encounter (Signed)
Call to patient.  No answer. No VM.

## 2016-12-14 NOTE — Telephone Encounter (Signed)
Another call to patient.  Could not reach.

## 2016-12-16 ENCOUNTER — Telehealth: Payer: Self-pay | Admitting: Family Medicine

## 2016-12-16 NOTE — Telephone Encounter (Signed)
Home health rep came by asking for Carolyn Pena about her referral to their facility.  She states that they haven't been able to start care since the pt has been out town for work reasons.  They will be starting care tomorrow

## 2016-12-17 DIAGNOSIS — K219 Gastro-esophageal reflux disease without esophagitis: Secondary | ICD-10-CM | POA: Diagnosis not present

## 2016-12-17 DIAGNOSIS — F411 Generalized anxiety disorder: Secondary | ICD-10-CM | POA: Diagnosis not present

## 2016-12-17 DIAGNOSIS — L89322 Pressure ulcer of left buttock, stage 2: Secondary | ICD-10-CM | POA: Diagnosis not present

## 2016-12-17 DIAGNOSIS — G8220 Paraplegia, unspecified: Secondary | ICD-10-CM | POA: Diagnosis not present

## 2016-12-17 DIAGNOSIS — G43909 Migraine, unspecified, not intractable, without status migrainosus: Secondary | ICD-10-CM | POA: Diagnosis not present

## 2016-12-17 DIAGNOSIS — M1990 Unspecified osteoarthritis, unspecified site: Secondary | ICD-10-CM | POA: Diagnosis not present

## 2016-12-17 DIAGNOSIS — Z7951 Long term (current) use of inhaled steroids: Secondary | ICD-10-CM | POA: Diagnosis not present

## 2016-12-17 DIAGNOSIS — J45909 Unspecified asthma, uncomplicated: Secondary | ICD-10-CM | POA: Diagnosis not present

## 2016-12-17 DIAGNOSIS — Z993 Dependence on wheelchair: Secondary | ICD-10-CM | POA: Diagnosis not present

## 2016-12-17 DIAGNOSIS — G808 Other cerebral palsy: Secondary | ICD-10-CM | POA: Diagnosis not present

## 2016-12-17 DIAGNOSIS — Z7952 Long term (current) use of systemic steroids: Secondary | ICD-10-CM | POA: Diagnosis not present

## 2016-12-17 DIAGNOSIS — L89312 Pressure ulcer of right buttock, stage 2: Secondary | ICD-10-CM | POA: Diagnosis not present

## 2016-12-17 DIAGNOSIS — E782 Mixed hyperlipidemia: Secondary | ICD-10-CM | POA: Diagnosis not present

## 2016-12-19 ENCOUNTER — Telehealth: Payer: Self-pay | Admitting: Family Medicine

## 2016-12-19 DIAGNOSIS — L89312 Pressure ulcer of right buttock, stage 2: Secondary | ICD-10-CM | POA: Diagnosis not present

## 2016-12-19 DIAGNOSIS — L89322 Pressure ulcer of left buttock, stage 2: Secondary | ICD-10-CM | POA: Diagnosis not present

## 2016-12-19 NOTE — Telephone Encounter (Signed)
Verl Dicker from [place] [phone] Call to get a verbal order for pt to have wound care please call

## 2016-12-20 ENCOUNTER — Telehealth: Payer: Self-pay

## 2016-12-20 DIAGNOSIS — G43909 Migraine, unspecified, not intractable, without status migrainosus: Secondary | ICD-10-CM | POA: Diagnosis not present

## 2016-12-20 DIAGNOSIS — L89312 Pressure ulcer of right buttock, stage 2: Secondary | ICD-10-CM | POA: Diagnosis not present

## 2016-12-20 DIAGNOSIS — M1990 Unspecified osteoarthritis, unspecified site: Secondary | ICD-10-CM | POA: Diagnosis not present

## 2016-12-20 DIAGNOSIS — Z7951 Long term (current) use of inhaled steroids: Secondary | ICD-10-CM | POA: Diagnosis not present

## 2016-12-20 DIAGNOSIS — F411 Generalized anxiety disorder: Secondary | ICD-10-CM | POA: Diagnosis not present

## 2016-12-20 DIAGNOSIS — G808 Other cerebral palsy: Secondary | ICD-10-CM | POA: Diagnosis not present

## 2016-12-20 DIAGNOSIS — Z993 Dependence on wheelchair: Secondary | ICD-10-CM | POA: Diagnosis not present

## 2016-12-20 DIAGNOSIS — L89322 Pressure ulcer of left buttock, stage 2: Secondary | ICD-10-CM | POA: Diagnosis not present

## 2016-12-20 DIAGNOSIS — K219 Gastro-esophageal reflux disease without esophagitis: Secondary | ICD-10-CM | POA: Diagnosis not present

## 2016-12-20 DIAGNOSIS — E782 Mixed hyperlipidemia: Secondary | ICD-10-CM | POA: Diagnosis not present

## 2016-12-20 DIAGNOSIS — J45909 Unspecified asthma, uncomplicated: Secondary | ICD-10-CM | POA: Diagnosis not present

## 2016-12-20 DIAGNOSIS — G8220 Paraplegia, unspecified: Secondary | ICD-10-CM | POA: Diagnosis not present

## 2016-12-20 DIAGNOSIS — Z7952 Long term (current) use of systemic steroids: Secondary | ICD-10-CM | POA: Diagnosis not present

## 2016-12-20 NOTE — Telephone Encounter (Signed)
LVM to call back for verbal order.

## 2016-12-20 NOTE — Telephone Encounter (Signed)
Verbal order given for home health wound care.

## 2016-12-21 DIAGNOSIS — Z993 Dependence on wheelchair: Secondary | ICD-10-CM | POA: Diagnosis not present

## 2016-12-21 DIAGNOSIS — E782 Mixed hyperlipidemia: Secondary | ICD-10-CM | POA: Diagnosis not present

## 2016-12-21 DIAGNOSIS — G43909 Migraine, unspecified, not intractable, without status migrainosus: Secondary | ICD-10-CM | POA: Diagnosis not present

## 2016-12-21 DIAGNOSIS — M1990 Unspecified osteoarthritis, unspecified site: Secondary | ICD-10-CM | POA: Diagnosis not present

## 2016-12-21 DIAGNOSIS — J45909 Unspecified asthma, uncomplicated: Secondary | ICD-10-CM | POA: Diagnosis not present

## 2016-12-21 DIAGNOSIS — F411 Generalized anxiety disorder: Secondary | ICD-10-CM | POA: Diagnosis not present

## 2016-12-21 DIAGNOSIS — Z7951 Long term (current) use of inhaled steroids: Secondary | ICD-10-CM | POA: Diagnosis not present

## 2016-12-21 DIAGNOSIS — K219 Gastro-esophageal reflux disease without esophagitis: Secondary | ICD-10-CM | POA: Diagnosis not present

## 2016-12-21 DIAGNOSIS — G8220 Paraplegia, unspecified: Secondary | ICD-10-CM | POA: Diagnosis not present

## 2016-12-21 DIAGNOSIS — G808 Other cerebral palsy: Secondary | ICD-10-CM | POA: Diagnosis not present

## 2016-12-21 DIAGNOSIS — L89322 Pressure ulcer of left buttock, stage 2: Secondary | ICD-10-CM | POA: Diagnosis not present

## 2016-12-21 DIAGNOSIS — L89312 Pressure ulcer of right buttock, stage 2: Secondary | ICD-10-CM | POA: Diagnosis not present

## 2016-12-21 DIAGNOSIS — Z7952 Long term (current) use of systemic steroids: Secondary | ICD-10-CM | POA: Diagnosis not present

## 2016-12-23 DIAGNOSIS — G8220 Paraplegia, unspecified: Secondary | ICD-10-CM | POA: Diagnosis not present

## 2016-12-23 DIAGNOSIS — J45909 Unspecified asthma, uncomplicated: Secondary | ICD-10-CM | POA: Diagnosis not present

## 2016-12-23 DIAGNOSIS — K219 Gastro-esophageal reflux disease without esophagitis: Secondary | ICD-10-CM | POA: Diagnosis not present

## 2016-12-23 DIAGNOSIS — G808 Other cerebral palsy: Secondary | ICD-10-CM | POA: Diagnosis not present

## 2016-12-23 DIAGNOSIS — Z7952 Long term (current) use of systemic steroids: Secondary | ICD-10-CM | POA: Diagnosis not present

## 2016-12-23 DIAGNOSIS — Z993 Dependence on wheelchair: Secondary | ICD-10-CM | POA: Diagnosis not present

## 2016-12-23 DIAGNOSIS — G43909 Migraine, unspecified, not intractable, without status migrainosus: Secondary | ICD-10-CM | POA: Diagnosis not present

## 2016-12-23 DIAGNOSIS — E782 Mixed hyperlipidemia: Secondary | ICD-10-CM | POA: Diagnosis not present

## 2016-12-23 DIAGNOSIS — L89312 Pressure ulcer of right buttock, stage 2: Secondary | ICD-10-CM | POA: Diagnosis not present

## 2016-12-23 DIAGNOSIS — L89322 Pressure ulcer of left buttock, stage 2: Secondary | ICD-10-CM | POA: Diagnosis not present

## 2016-12-23 DIAGNOSIS — Z7951 Long term (current) use of inhaled steroids: Secondary | ICD-10-CM | POA: Diagnosis not present

## 2016-12-23 DIAGNOSIS — M1990 Unspecified osteoarthritis, unspecified site: Secondary | ICD-10-CM | POA: Diagnosis not present

## 2016-12-23 DIAGNOSIS — R32 Unspecified urinary incontinence: Secondary | ICD-10-CM | POA: Diagnosis not present

## 2016-12-23 DIAGNOSIS — G9050 Complex regional pain syndrome I, unspecified: Secondary | ICD-10-CM | POA: Diagnosis not present

## 2016-12-23 DIAGNOSIS — F411 Generalized anxiety disorder: Secondary | ICD-10-CM | POA: Diagnosis not present

## 2016-12-30 DIAGNOSIS — J029 Acute pharyngitis, unspecified: Secondary | ICD-10-CM | POA: Diagnosis not present

## 2016-12-30 DIAGNOSIS — K219 Gastro-esophageal reflux disease without esophagitis: Secondary | ICD-10-CM | POA: Diagnosis not present

## 2016-12-30 DIAGNOSIS — G43709 Chronic migraine without aura, not intractable, without status migrainosus: Secondary | ICD-10-CM | POA: Diagnosis not present

## 2016-12-30 DIAGNOSIS — K580 Irritable bowel syndrome with diarrhea: Secondary | ICD-10-CM | POA: Diagnosis not present

## 2017-01-17 ENCOUNTER — Telehealth: Payer: Self-pay

## 2017-01-17 NOTE — Telephone Encounter (Signed)
Carolyn Pena from [facility] came by she stated they need another order for evaluate and treat, due to the insurance she has [facility] keeps needing them. The nurse stated she can have on for 30 days. She stated if it's okay we can call her [name]) and give her a verbal order. [phone]

## 2017-01-17 NOTE — Telephone Encounter (Signed)
That's fine

## 2017-01-18 NOTE — Telephone Encounter (Signed)
Phone call to [name] at [facility]. Relayed verbal orders from Dr. Brigitte Pulse. She states she will need written orders, will stop by clinic later for this.

## 2017-01-18 NOTE — Telephone Encounter (Signed)
Verbal orders faxed to [name]. Closing note.

## 2017-01-24 DIAGNOSIS — G43909 Migraine, unspecified, not intractable, without status migrainosus: Secondary | ICD-10-CM | POA: Diagnosis not present

## 2017-01-24 DIAGNOSIS — M1990 Unspecified osteoarthritis, unspecified site: Secondary | ICD-10-CM | POA: Diagnosis not present

## 2017-01-24 DIAGNOSIS — R32 Unspecified urinary incontinence: Secondary | ICD-10-CM | POA: Diagnosis not present

## 2017-01-24 DIAGNOSIS — G8220 Paraplegia, unspecified: Secondary | ICD-10-CM | POA: Diagnosis not present

## 2017-01-24 DIAGNOSIS — Z7952 Long term (current) use of systemic steroids: Secondary | ICD-10-CM | POA: Diagnosis not present

## 2017-01-24 DIAGNOSIS — K219 Gastro-esophageal reflux disease without esophagitis: Secondary | ICD-10-CM | POA: Diagnosis not present

## 2017-01-24 DIAGNOSIS — J45909 Unspecified asthma, uncomplicated: Secondary | ICD-10-CM | POA: Diagnosis not present

## 2017-01-24 DIAGNOSIS — L89322 Pressure ulcer of left buttock, stage 2: Secondary | ICD-10-CM | POA: Diagnosis not present

## 2017-01-24 DIAGNOSIS — Z7951 Long term (current) use of inhaled steroids: Secondary | ICD-10-CM | POA: Diagnosis not present

## 2017-01-24 DIAGNOSIS — L89312 Pressure ulcer of right buttock, stage 2: Secondary | ICD-10-CM | POA: Diagnosis not present

## 2017-01-24 DIAGNOSIS — G808 Other cerebral palsy: Secondary | ICD-10-CM | POA: Diagnosis not present

## 2017-01-24 DIAGNOSIS — Z993 Dependence on wheelchair: Secondary | ICD-10-CM | POA: Diagnosis not present

## 2017-01-24 DIAGNOSIS — E782 Mixed hyperlipidemia: Secondary | ICD-10-CM | POA: Diagnosis not present

## 2017-01-24 DIAGNOSIS — F411 Generalized anxiety disorder: Secondary | ICD-10-CM | POA: Diagnosis not present

## 2017-01-25 DIAGNOSIS — L89312 Pressure ulcer of right buttock, stage 2: Secondary | ICD-10-CM | POA: Diagnosis not present

## 2017-01-25 DIAGNOSIS — G43909 Migraine, unspecified, not intractable, without status migrainosus: Secondary | ICD-10-CM | POA: Diagnosis not present

## 2017-01-25 DIAGNOSIS — K219 Gastro-esophageal reflux disease without esophagitis: Secondary | ICD-10-CM | POA: Diagnosis not present

## 2017-01-25 DIAGNOSIS — M1990 Unspecified osteoarthritis, unspecified site: Secondary | ICD-10-CM | POA: Diagnosis not present

## 2017-01-25 DIAGNOSIS — F411 Generalized anxiety disorder: Secondary | ICD-10-CM | POA: Diagnosis not present

## 2017-01-25 DIAGNOSIS — Z993 Dependence on wheelchair: Secondary | ICD-10-CM | POA: Diagnosis not present

## 2017-01-25 DIAGNOSIS — L89322 Pressure ulcer of left buttock, stage 2: Secondary | ICD-10-CM | POA: Diagnosis not present

## 2017-01-25 DIAGNOSIS — E782 Mixed hyperlipidemia: Secondary | ICD-10-CM | POA: Diagnosis not present

## 2017-01-25 DIAGNOSIS — Z7952 Long term (current) use of systemic steroids: Secondary | ICD-10-CM | POA: Diagnosis not present

## 2017-01-25 DIAGNOSIS — J45909 Unspecified asthma, uncomplicated: Secondary | ICD-10-CM | POA: Diagnosis not present

## 2017-01-25 DIAGNOSIS — G808 Other cerebral palsy: Secondary | ICD-10-CM | POA: Diagnosis not present

## 2017-01-25 DIAGNOSIS — Z7951 Long term (current) use of inhaled steroids: Secondary | ICD-10-CM | POA: Diagnosis not present

## 2017-01-25 DIAGNOSIS — G8220 Paraplegia, unspecified: Secondary | ICD-10-CM | POA: Diagnosis not present

## 2017-01-30 ENCOUNTER — Telehealth: Payer: Self-pay | Admitting: Family Medicine

## 2017-01-30 NOTE — Telephone Encounter (Unsigned)
Copied from [place]. Topic: Inquiry >> Jan 30, 2017 12:00 PM Malena Catholic I, NT wrote: Reason for CRM: [place] call for OT For 4 weeks and OT for 4 weeks and  Medical Evaluation Please call with a answer that way she can star come care.her num is [phone] Thanks

## 2017-01-30 NOTE — Telephone Encounter (Signed)
Sounds great. Thanks.

## 2017-01-31 NOTE — Telephone Encounter (Signed)
informed

## 2017-02-02 DIAGNOSIS — M1990 Unspecified osteoarthritis, unspecified site: Secondary | ICD-10-CM | POA: Diagnosis not present

## 2017-02-02 DIAGNOSIS — Z7951 Long term (current) use of inhaled steroids: Secondary | ICD-10-CM | POA: Diagnosis not present

## 2017-02-02 DIAGNOSIS — G808 Other cerebral palsy: Secondary | ICD-10-CM | POA: Diagnosis not present

## 2017-02-02 DIAGNOSIS — L89322 Pressure ulcer of left buttock, stage 2: Secondary | ICD-10-CM | POA: Diagnosis not present

## 2017-02-02 DIAGNOSIS — F411 Generalized anxiety disorder: Secondary | ICD-10-CM | POA: Diagnosis not present

## 2017-02-02 DIAGNOSIS — J45909 Unspecified asthma, uncomplicated: Secondary | ICD-10-CM | POA: Diagnosis not present

## 2017-02-02 DIAGNOSIS — Z7952 Long term (current) use of systemic steroids: Secondary | ICD-10-CM | POA: Diagnosis not present

## 2017-02-02 DIAGNOSIS — L89312 Pressure ulcer of right buttock, stage 2: Secondary | ICD-10-CM | POA: Diagnosis not present

## 2017-02-02 DIAGNOSIS — Z993 Dependence on wheelchair: Secondary | ICD-10-CM | POA: Diagnosis not present

## 2017-02-02 DIAGNOSIS — G43909 Migraine, unspecified, not intractable, without status migrainosus: Secondary | ICD-10-CM | POA: Diagnosis not present

## 2017-02-02 DIAGNOSIS — K219 Gastro-esophageal reflux disease without esophagitis: Secondary | ICD-10-CM | POA: Diagnosis not present

## 2017-02-02 DIAGNOSIS — E782 Mixed hyperlipidemia: Secondary | ICD-10-CM | POA: Diagnosis not present

## 2017-02-02 DIAGNOSIS — G8220 Paraplegia, unspecified: Secondary | ICD-10-CM | POA: Diagnosis not present

## 2017-02-07 NOTE — Telephone Encounter (Signed)
Duplicate note - seen 33/58 telephone note Dr. Brigitte Pulse, Called patient's only insurance on file which was through her [employer] and they stated that the nexplanon is not covered.  They mentioned that patient may have additional insurance.  I tried to reach patient but could not. (I assumed she is probably in class at this time)   It is odd to me that it is not covered through the [facility] Options plan because usually they cover 80% and patient must cover 20%. I will do whatever you need, just let me know. Renay

## 2017-02-09 DIAGNOSIS — G43909 Migraine, unspecified, not intractable, without status migrainosus: Secondary | ICD-10-CM | POA: Diagnosis not present

## 2017-02-09 DIAGNOSIS — E782 Mixed hyperlipidemia: Secondary | ICD-10-CM | POA: Diagnosis not present

## 2017-02-09 DIAGNOSIS — Z993 Dependence on wheelchair: Secondary | ICD-10-CM | POA: Diagnosis not present

## 2017-02-09 DIAGNOSIS — G808 Other cerebral palsy: Secondary | ICD-10-CM | POA: Diagnosis not present

## 2017-02-09 DIAGNOSIS — J45909 Unspecified asthma, uncomplicated: Secondary | ICD-10-CM | POA: Diagnosis not present

## 2017-02-09 DIAGNOSIS — L89312 Pressure ulcer of right buttock, stage 2: Secondary | ICD-10-CM | POA: Diagnosis not present

## 2017-02-09 DIAGNOSIS — K219 Gastro-esophageal reflux disease without esophagitis: Secondary | ICD-10-CM | POA: Diagnosis not present

## 2017-02-09 DIAGNOSIS — M1990 Unspecified osteoarthritis, unspecified site: Secondary | ICD-10-CM | POA: Diagnosis not present

## 2017-02-09 DIAGNOSIS — G8220 Paraplegia, unspecified: Secondary | ICD-10-CM | POA: Diagnosis not present

## 2017-02-09 DIAGNOSIS — Z7951 Long term (current) use of inhaled steroids: Secondary | ICD-10-CM | POA: Diagnosis not present

## 2017-02-09 DIAGNOSIS — L89322 Pressure ulcer of left buttock, stage 2: Secondary | ICD-10-CM | POA: Diagnosis not present

## 2017-02-09 DIAGNOSIS — Z7952 Long term (current) use of systemic steroids: Secondary | ICD-10-CM | POA: Diagnosis not present

## 2017-02-09 DIAGNOSIS — F411 Generalized anxiety disorder: Secondary | ICD-10-CM | POA: Diagnosis not present

## 2017-02-10 DIAGNOSIS — E782 Mixed hyperlipidemia: Secondary | ICD-10-CM | POA: Diagnosis not present

## 2017-02-10 DIAGNOSIS — K219 Gastro-esophageal reflux disease without esophagitis: Secondary | ICD-10-CM | POA: Diagnosis not present

## 2017-02-10 DIAGNOSIS — F411 Generalized anxiety disorder: Secondary | ICD-10-CM | POA: Diagnosis not present

## 2017-02-10 DIAGNOSIS — Z993 Dependence on wheelchair: Secondary | ICD-10-CM | POA: Diagnosis not present

## 2017-02-10 DIAGNOSIS — J45909 Unspecified asthma, uncomplicated: Secondary | ICD-10-CM | POA: Diagnosis not present

## 2017-02-10 DIAGNOSIS — L89312 Pressure ulcer of right buttock, stage 2: Secondary | ICD-10-CM | POA: Diagnosis not present

## 2017-02-10 DIAGNOSIS — Z7951 Long term (current) use of inhaled steroids: Secondary | ICD-10-CM | POA: Diagnosis not present

## 2017-02-10 DIAGNOSIS — Z7952 Long term (current) use of systemic steroids: Secondary | ICD-10-CM | POA: Diagnosis not present

## 2017-02-10 DIAGNOSIS — M1990 Unspecified osteoarthritis, unspecified site: Secondary | ICD-10-CM | POA: Diagnosis not present

## 2017-02-10 DIAGNOSIS — L89322 Pressure ulcer of left buttock, stage 2: Secondary | ICD-10-CM | POA: Diagnosis not present

## 2017-02-10 DIAGNOSIS — G8220 Paraplegia, unspecified: Secondary | ICD-10-CM | POA: Diagnosis not present

## 2017-02-10 DIAGNOSIS — G808 Other cerebral palsy: Secondary | ICD-10-CM | POA: Diagnosis not present

## 2017-02-10 DIAGNOSIS — G43909 Migraine, unspecified, not intractable, without status migrainosus: Secondary | ICD-10-CM | POA: Diagnosis not present

## 2017-02-15 ENCOUNTER — Telehealth: Payer: Self-pay

## 2017-02-15 DIAGNOSIS — G808 Other cerebral palsy: Secondary | ICD-10-CM | POA: Diagnosis not present

## 2017-02-15 DIAGNOSIS — G8220 Paraplegia, unspecified: Secondary | ICD-10-CM | POA: Diagnosis not present

## 2017-02-15 DIAGNOSIS — Z7952 Long term (current) use of systemic steroids: Secondary | ICD-10-CM | POA: Diagnosis not present

## 2017-02-15 DIAGNOSIS — L89322 Pressure ulcer of left buttock, stage 2: Secondary | ICD-10-CM | POA: Diagnosis not present

## 2017-02-15 DIAGNOSIS — F411 Generalized anxiety disorder: Secondary | ICD-10-CM | POA: Diagnosis not present

## 2017-02-15 DIAGNOSIS — M1990 Unspecified osteoarthritis, unspecified site: Secondary | ICD-10-CM | POA: Diagnosis not present

## 2017-02-15 DIAGNOSIS — K219 Gastro-esophageal reflux disease without esophagitis: Secondary | ICD-10-CM | POA: Diagnosis not present

## 2017-02-15 DIAGNOSIS — E782 Mixed hyperlipidemia: Secondary | ICD-10-CM | POA: Diagnosis not present

## 2017-02-15 DIAGNOSIS — Z993 Dependence on wheelchair: Secondary | ICD-10-CM | POA: Diagnosis not present

## 2017-02-15 DIAGNOSIS — L89312 Pressure ulcer of right buttock, stage 2: Secondary | ICD-10-CM | POA: Diagnosis not present

## 2017-02-15 DIAGNOSIS — G43909 Migraine, unspecified, not intractable, without status migrainosus: Secondary | ICD-10-CM | POA: Diagnosis not present

## 2017-02-15 DIAGNOSIS — Z7951 Long term (current) use of inhaled steroids: Secondary | ICD-10-CM | POA: Diagnosis not present

## 2017-02-15 DIAGNOSIS — J45909 Unspecified asthma, uncomplicated: Secondary | ICD-10-CM | POA: Diagnosis not present

## 2017-02-15 NOTE — Telephone Encounter (Signed)
Copied from [place] [phone]. Topic: General - Other >> Feb 15, 2017  4:12 PM Aurelio Brash B wrote: Reason for CRM: Lysbeth Galas  from [place] needs verbal order Nursing evaluation. Her aide  said she is having skin break down and want nurse to check  for wounds  [phone]

## 2017-02-15 NOTE — Telephone Encounter (Signed)
Phone call to [facility]. Verbal orders given to check for wounds around skin breakdown. Lysbeth Galas appreciative. Closing note.

## 2017-02-16 DIAGNOSIS — L89322 Pressure ulcer of left buttock, stage 2: Secondary | ICD-10-CM | POA: Diagnosis not present

## 2017-02-16 DIAGNOSIS — G808 Other cerebral palsy: Secondary | ICD-10-CM | POA: Diagnosis not present

## 2017-02-16 DIAGNOSIS — K219 Gastro-esophageal reflux disease without esophagitis: Secondary | ICD-10-CM | POA: Diagnosis not present

## 2017-02-16 DIAGNOSIS — J45909 Unspecified asthma, uncomplicated: Secondary | ICD-10-CM | POA: Diagnosis not present

## 2017-02-16 DIAGNOSIS — M1990 Unspecified osteoarthritis, unspecified site: Secondary | ICD-10-CM | POA: Diagnosis not present

## 2017-02-16 DIAGNOSIS — E782 Mixed hyperlipidemia: Secondary | ICD-10-CM | POA: Diagnosis not present

## 2017-02-16 DIAGNOSIS — G43909 Migraine, unspecified, not intractable, without status migrainosus: Secondary | ICD-10-CM | POA: Diagnosis not present

## 2017-02-16 DIAGNOSIS — G8220 Paraplegia, unspecified: Secondary | ICD-10-CM | POA: Diagnosis not present

## 2017-02-16 DIAGNOSIS — Z7952 Long term (current) use of systemic steroids: Secondary | ICD-10-CM | POA: Diagnosis not present

## 2017-02-16 DIAGNOSIS — L89312 Pressure ulcer of right buttock, stage 2: Secondary | ICD-10-CM | POA: Diagnosis not present

## 2017-02-16 DIAGNOSIS — Z7951 Long term (current) use of inhaled steroids: Secondary | ICD-10-CM | POA: Diagnosis not present

## 2017-02-16 DIAGNOSIS — Z993 Dependence on wheelchair: Secondary | ICD-10-CM | POA: Diagnosis not present

## 2017-02-16 DIAGNOSIS — F411 Generalized anxiety disorder: Secondary | ICD-10-CM | POA: Diagnosis not present

## 2017-02-17 DIAGNOSIS — K219 Gastro-esophageal reflux disease without esophagitis: Secondary | ICD-10-CM | POA: Diagnosis not present

## 2017-02-17 DIAGNOSIS — J45909 Unspecified asthma, uncomplicated: Secondary | ICD-10-CM | POA: Diagnosis not present

## 2017-02-17 DIAGNOSIS — F411 Generalized anxiety disorder: Secondary | ICD-10-CM | POA: Diagnosis not present

## 2017-02-17 DIAGNOSIS — G808 Other cerebral palsy: Secondary | ICD-10-CM | POA: Diagnosis not present

## 2017-02-17 DIAGNOSIS — M1990 Unspecified osteoarthritis, unspecified site: Secondary | ICD-10-CM | POA: Diagnosis not present

## 2017-02-17 DIAGNOSIS — Z7952 Long term (current) use of systemic steroids: Secondary | ICD-10-CM | POA: Diagnosis not present

## 2017-02-17 DIAGNOSIS — Z7951 Long term (current) use of inhaled steroids: Secondary | ICD-10-CM | POA: Diagnosis not present

## 2017-02-17 DIAGNOSIS — Z993 Dependence on wheelchair: Secondary | ICD-10-CM | POA: Diagnosis not present

## 2017-02-17 DIAGNOSIS — G8220 Paraplegia, unspecified: Secondary | ICD-10-CM | POA: Diagnosis not present

## 2017-02-17 DIAGNOSIS — G43909 Migraine, unspecified, not intractable, without status migrainosus: Secondary | ICD-10-CM | POA: Diagnosis not present

## 2017-02-17 DIAGNOSIS — E782 Mixed hyperlipidemia: Secondary | ICD-10-CM | POA: Diagnosis not present

## 2017-02-17 DIAGNOSIS — L89322 Pressure ulcer of left buttock, stage 2: Secondary | ICD-10-CM | POA: Diagnosis not present

## 2017-02-17 DIAGNOSIS — L89312 Pressure ulcer of right buttock, stage 2: Secondary | ICD-10-CM | POA: Diagnosis not present

## 2017-02-20 DIAGNOSIS — L89312 Pressure ulcer of right buttock, stage 2: Secondary | ICD-10-CM | POA: Diagnosis not present

## 2017-02-20 DIAGNOSIS — L89322 Pressure ulcer of left buttock, stage 2: Secondary | ICD-10-CM | POA: Diagnosis not present

## 2017-02-24 DIAGNOSIS — Z7952 Long term (current) use of systemic steroids: Secondary | ICD-10-CM | POA: Diagnosis not present

## 2017-02-24 DIAGNOSIS — G43909 Migraine, unspecified, not intractable, without status migrainosus: Secondary | ICD-10-CM | POA: Diagnosis not present

## 2017-02-24 DIAGNOSIS — G8220 Paraplegia, unspecified: Secondary | ICD-10-CM | POA: Diagnosis not present

## 2017-02-24 DIAGNOSIS — Z993 Dependence on wheelchair: Secondary | ICD-10-CM | POA: Diagnosis not present

## 2017-02-24 DIAGNOSIS — J45909 Unspecified asthma, uncomplicated: Secondary | ICD-10-CM | POA: Diagnosis not present

## 2017-02-24 DIAGNOSIS — F411 Generalized anxiety disorder: Secondary | ICD-10-CM | POA: Diagnosis not present

## 2017-02-24 DIAGNOSIS — Z7951 Long term (current) use of inhaled steroids: Secondary | ICD-10-CM | POA: Diagnosis not present

## 2017-02-24 DIAGNOSIS — G808 Other cerebral palsy: Secondary | ICD-10-CM | POA: Diagnosis not present

## 2017-02-24 DIAGNOSIS — L89312 Pressure ulcer of right buttock, stage 2: Secondary | ICD-10-CM | POA: Diagnosis not present

## 2017-02-24 DIAGNOSIS — K219 Gastro-esophageal reflux disease without esophagitis: Secondary | ICD-10-CM | POA: Diagnosis not present

## 2017-02-24 DIAGNOSIS — E782 Mixed hyperlipidemia: Secondary | ICD-10-CM | POA: Diagnosis not present

## 2017-02-24 DIAGNOSIS — M1990 Unspecified osteoarthritis, unspecified site: Secondary | ICD-10-CM | POA: Diagnosis not present

## 2017-02-24 DIAGNOSIS — L89322 Pressure ulcer of left buttock, stage 2: Secondary | ICD-10-CM | POA: Diagnosis not present

## 2017-02-27 DIAGNOSIS — E782 Mixed hyperlipidemia: Secondary | ICD-10-CM | POA: Diagnosis not present

## 2017-02-27 DIAGNOSIS — M1990 Unspecified osteoarthritis, unspecified site: Secondary | ICD-10-CM | POA: Diagnosis not present

## 2017-02-27 DIAGNOSIS — J45909 Unspecified asthma, uncomplicated: Secondary | ICD-10-CM | POA: Diagnosis not present

## 2017-02-27 DIAGNOSIS — L89312 Pressure ulcer of right buttock, stage 2: Secondary | ICD-10-CM | POA: Diagnosis not present

## 2017-02-27 DIAGNOSIS — K219 Gastro-esophageal reflux disease without esophagitis: Secondary | ICD-10-CM | POA: Diagnosis not present

## 2017-02-27 DIAGNOSIS — G43909 Migraine, unspecified, not intractable, without status migrainosus: Secondary | ICD-10-CM | POA: Diagnosis not present

## 2017-02-27 DIAGNOSIS — G808 Other cerebral palsy: Secondary | ICD-10-CM | POA: Diagnosis not present

## 2017-02-27 DIAGNOSIS — G8220 Paraplegia, unspecified: Secondary | ICD-10-CM | POA: Diagnosis not present

## 2017-02-27 DIAGNOSIS — Z7952 Long term (current) use of systemic steroids: Secondary | ICD-10-CM | POA: Diagnosis not present

## 2017-02-27 DIAGNOSIS — F411 Generalized anxiety disorder: Secondary | ICD-10-CM | POA: Diagnosis not present

## 2017-02-27 DIAGNOSIS — Z7951 Long term (current) use of inhaled steroids: Secondary | ICD-10-CM | POA: Diagnosis not present

## 2017-02-27 DIAGNOSIS — L89322 Pressure ulcer of left buttock, stage 2: Secondary | ICD-10-CM | POA: Diagnosis not present

## 2017-02-27 DIAGNOSIS — Z993 Dependence on wheelchair: Secondary | ICD-10-CM | POA: Diagnosis not present

## 2017-03-01 ENCOUNTER — Encounter: Payer: Self-pay | Admitting: Family Medicine

## 2017-03-01 DIAGNOSIS — M1990 Unspecified osteoarthritis, unspecified site: Secondary | ICD-10-CM | POA: Diagnosis not present

## 2017-03-01 DIAGNOSIS — F411 Generalized anxiety disorder: Secondary | ICD-10-CM | POA: Diagnosis not present

## 2017-03-01 DIAGNOSIS — G43909 Migraine, unspecified, not intractable, without status migrainosus: Secondary | ICD-10-CM | POA: Diagnosis not present

## 2017-03-01 DIAGNOSIS — Z7951 Long term (current) use of inhaled steroids: Secondary | ICD-10-CM | POA: Diagnosis not present

## 2017-03-01 DIAGNOSIS — G808 Other cerebral palsy: Secondary | ICD-10-CM | POA: Diagnosis not present

## 2017-03-01 DIAGNOSIS — G8220 Paraplegia, unspecified: Secondary | ICD-10-CM | POA: Diagnosis not present

## 2017-03-01 DIAGNOSIS — Z7952 Long term (current) use of systemic steroids: Secondary | ICD-10-CM | POA: Diagnosis not present

## 2017-03-01 DIAGNOSIS — J45909 Unspecified asthma, uncomplicated: Secondary | ICD-10-CM | POA: Diagnosis not present

## 2017-03-01 DIAGNOSIS — L89312 Pressure ulcer of right buttock, stage 2: Secondary | ICD-10-CM | POA: Diagnosis not present

## 2017-03-01 DIAGNOSIS — L89322 Pressure ulcer of left buttock, stage 2: Secondary | ICD-10-CM | POA: Diagnosis not present

## 2017-03-01 DIAGNOSIS — K219 Gastro-esophageal reflux disease without esophagitis: Secondary | ICD-10-CM | POA: Diagnosis not present

## 2017-03-01 DIAGNOSIS — E782 Mixed hyperlipidemia: Secondary | ICD-10-CM | POA: Diagnosis not present

## 2017-03-01 DIAGNOSIS — Z993 Dependence on wheelchair: Secondary | ICD-10-CM | POA: Diagnosis not present

## 2017-03-03 ENCOUNTER — Telehealth: Payer: Self-pay | Admitting: Family Medicine

## 2017-03-03 NOTE — Telephone Encounter (Signed)
Copied from [place] [phone]. Topic: General - Other >> Mar 03, 2017  3:02 PM Lolita Rieger, [place] wrote: Reason for CRM: Amy for [facility] homehealth called and wanted to notify the doctor that she was not able to see pt today [phone] is the number to reach her with any questions

## 2017-03-04 NOTE — Telephone Encounter (Signed)
Provider, Juluis Rainier.

## 2017-03-04 NOTE — Telephone Encounter (Signed)
noted 

## 2017-03-08 DIAGNOSIS — Z993 Dependence on wheelchair: Secondary | ICD-10-CM | POA: Diagnosis not present

## 2017-03-08 DIAGNOSIS — F411 Generalized anxiety disorder: Secondary | ICD-10-CM | POA: Diagnosis not present

## 2017-03-08 DIAGNOSIS — M1990 Unspecified osteoarthritis, unspecified site: Secondary | ICD-10-CM | POA: Diagnosis not present

## 2017-03-08 DIAGNOSIS — G43909 Migraine, unspecified, not intractable, without status migrainosus: Secondary | ICD-10-CM | POA: Diagnosis not present

## 2017-03-08 DIAGNOSIS — Z7951 Long term (current) use of inhaled steroids: Secondary | ICD-10-CM | POA: Diagnosis not present

## 2017-03-08 DIAGNOSIS — Z7952 Long term (current) use of systemic steroids: Secondary | ICD-10-CM | POA: Diagnosis not present

## 2017-03-08 DIAGNOSIS — K219 Gastro-esophageal reflux disease without esophagitis: Secondary | ICD-10-CM | POA: Diagnosis not present

## 2017-03-08 DIAGNOSIS — J45909 Unspecified asthma, uncomplicated: Secondary | ICD-10-CM | POA: Diagnosis not present

## 2017-03-08 DIAGNOSIS — L89312 Pressure ulcer of right buttock, stage 2: Secondary | ICD-10-CM | POA: Diagnosis not present

## 2017-03-08 DIAGNOSIS — G8220 Paraplegia, unspecified: Secondary | ICD-10-CM | POA: Diagnosis not present

## 2017-03-08 DIAGNOSIS — G808 Other cerebral palsy: Secondary | ICD-10-CM | POA: Diagnosis not present

## 2017-03-08 DIAGNOSIS — L89322 Pressure ulcer of left buttock, stage 2: Secondary | ICD-10-CM | POA: Diagnosis not present

## 2017-03-08 DIAGNOSIS — E782 Mixed hyperlipidemia: Secondary | ICD-10-CM | POA: Diagnosis not present

## 2017-03-10 DIAGNOSIS — M1990 Unspecified osteoarthritis, unspecified site: Secondary | ICD-10-CM | POA: Diagnosis not present

## 2017-03-10 DIAGNOSIS — F411 Generalized anxiety disorder: Secondary | ICD-10-CM | POA: Diagnosis not present

## 2017-03-10 DIAGNOSIS — L89322 Pressure ulcer of left buttock, stage 2: Secondary | ICD-10-CM | POA: Diagnosis not present

## 2017-03-10 DIAGNOSIS — L89312 Pressure ulcer of right buttock, stage 2: Secondary | ICD-10-CM | POA: Diagnosis not present

## 2017-03-10 DIAGNOSIS — G43909 Migraine, unspecified, not intractable, without status migrainosus: Secondary | ICD-10-CM | POA: Diagnosis not present

## 2017-03-10 DIAGNOSIS — E782 Mixed hyperlipidemia: Secondary | ICD-10-CM | POA: Diagnosis not present

## 2017-03-10 DIAGNOSIS — Z7951 Long term (current) use of inhaled steroids: Secondary | ICD-10-CM | POA: Diagnosis not present

## 2017-03-10 DIAGNOSIS — K219 Gastro-esophageal reflux disease without esophagitis: Secondary | ICD-10-CM | POA: Diagnosis not present

## 2017-03-10 DIAGNOSIS — G8220 Paraplegia, unspecified: Secondary | ICD-10-CM | POA: Diagnosis not present

## 2017-03-10 DIAGNOSIS — Z993 Dependence on wheelchair: Secondary | ICD-10-CM | POA: Diagnosis not present

## 2017-03-10 DIAGNOSIS — G808 Other cerebral palsy: Secondary | ICD-10-CM | POA: Diagnosis not present

## 2017-03-10 DIAGNOSIS — Z7952 Long term (current) use of systemic steroids: Secondary | ICD-10-CM | POA: Diagnosis not present

## 2017-03-10 DIAGNOSIS — J45909 Unspecified asthma, uncomplicated: Secondary | ICD-10-CM | POA: Diagnosis not present

## 2017-03-16 DIAGNOSIS — K219 Gastro-esophageal reflux disease without esophagitis: Secondary | ICD-10-CM | POA: Diagnosis not present

## 2017-03-16 DIAGNOSIS — M1990 Unspecified osteoarthritis, unspecified site: Secondary | ICD-10-CM | POA: Diagnosis not present

## 2017-03-16 DIAGNOSIS — Z7952 Long term (current) use of systemic steroids: Secondary | ICD-10-CM | POA: Diagnosis not present

## 2017-03-16 DIAGNOSIS — L89322 Pressure ulcer of left buttock, stage 2: Secondary | ICD-10-CM | POA: Diagnosis not present

## 2017-03-16 DIAGNOSIS — Z993 Dependence on wheelchair: Secondary | ICD-10-CM | POA: Diagnosis not present

## 2017-03-16 DIAGNOSIS — G8220 Paraplegia, unspecified: Secondary | ICD-10-CM | POA: Diagnosis not present

## 2017-03-16 DIAGNOSIS — E782 Mixed hyperlipidemia: Secondary | ICD-10-CM | POA: Diagnosis not present

## 2017-03-16 DIAGNOSIS — L89312 Pressure ulcer of right buttock, stage 2: Secondary | ICD-10-CM | POA: Diagnosis not present

## 2017-03-16 DIAGNOSIS — J45909 Unspecified asthma, uncomplicated: Secondary | ICD-10-CM | POA: Diagnosis not present

## 2017-03-16 DIAGNOSIS — G43909 Migraine, unspecified, not intractable, without status migrainosus: Secondary | ICD-10-CM | POA: Diagnosis not present

## 2017-03-16 DIAGNOSIS — G808 Other cerebral palsy: Secondary | ICD-10-CM | POA: Diagnosis not present

## 2017-03-16 DIAGNOSIS — F411 Generalized anxiety disorder: Secondary | ICD-10-CM | POA: Diagnosis not present

## 2017-03-16 DIAGNOSIS — Z7951 Long term (current) use of inhaled steroids: Secondary | ICD-10-CM | POA: Diagnosis not present

## 2017-03-17 DIAGNOSIS — G43909 Migraine, unspecified, not intractable, without status migrainosus: Secondary | ICD-10-CM | POA: Diagnosis not present

## 2017-03-17 DIAGNOSIS — G8220 Paraplegia, unspecified: Secondary | ICD-10-CM | POA: Diagnosis not present

## 2017-03-17 DIAGNOSIS — Z993 Dependence on wheelchair: Secondary | ICD-10-CM | POA: Diagnosis not present

## 2017-03-17 DIAGNOSIS — Z7951 Long term (current) use of inhaled steroids: Secondary | ICD-10-CM | POA: Diagnosis not present

## 2017-03-17 DIAGNOSIS — K219 Gastro-esophageal reflux disease without esophagitis: Secondary | ICD-10-CM | POA: Diagnosis not present

## 2017-03-17 DIAGNOSIS — J45909 Unspecified asthma, uncomplicated: Secondary | ICD-10-CM | POA: Diagnosis not present

## 2017-03-17 DIAGNOSIS — L89312 Pressure ulcer of right buttock, stage 2: Secondary | ICD-10-CM | POA: Diagnosis not present

## 2017-03-17 DIAGNOSIS — M1990 Unspecified osteoarthritis, unspecified site: Secondary | ICD-10-CM | POA: Diagnosis not present

## 2017-03-17 DIAGNOSIS — Z7952 Long term (current) use of systemic steroids: Secondary | ICD-10-CM | POA: Diagnosis not present

## 2017-03-17 DIAGNOSIS — L89322 Pressure ulcer of left buttock, stage 2: Secondary | ICD-10-CM | POA: Diagnosis not present

## 2017-03-17 DIAGNOSIS — E782 Mixed hyperlipidemia: Secondary | ICD-10-CM | POA: Diagnosis not present

## 2017-03-17 DIAGNOSIS — G808 Other cerebral palsy: Secondary | ICD-10-CM | POA: Diagnosis not present

## 2017-03-17 DIAGNOSIS — F411 Generalized anxiety disorder: Secondary | ICD-10-CM | POA: Diagnosis not present

## 2017-03-18 DIAGNOSIS — L89312 Pressure ulcer of right buttock, stage 2: Secondary | ICD-10-CM | POA: Diagnosis not present

## 2017-03-18 DIAGNOSIS — Z7951 Long term (current) use of inhaled steroids: Secondary | ICD-10-CM | POA: Diagnosis not present

## 2017-03-18 DIAGNOSIS — G8220 Paraplegia, unspecified: Secondary | ICD-10-CM | POA: Diagnosis not present

## 2017-03-18 DIAGNOSIS — L89322 Pressure ulcer of left buttock, stage 2: Secondary | ICD-10-CM | POA: Diagnosis not present

## 2017-03-18 DIAGNOSIS — J45909 Unspecified asthma, uncomplicated: Secondary | ICD-10-CM | POA: Diagnosis not present

## 2017-03-18 DIAGNOSIS — F411 Generalized anxiety disorder: Secondary | ICD-10-CM | POA: Diagnosis not present

## 2017-03-18 DIAGNOSIS — G43909 Migraine, unspecified, not intractable, without status migrainosus: Secondary | ICD-10-CM | POA: Diagnosis not present

## 2017-03-18 DIAGNOSIS — M1990 Unspecified osteoarthritis, unspecified site: Secondary | ICD-10-CM | POA: Diagnosis not present

## 2017-03-18 DIAGNOSIS — Z993 Dependence on wheelchair: Secondary | ICD-10-CM | POA: Diagnosis not present

## 2017-03-18 DIAGNOSIS — G808 Other cerebral palsy: Secondary | ICD-10-CM | POA: Diagnosis not present

## 2017-03-18 DIAGNOSIS — Z7952 Long term (current) use of systemic steroids: Secondary | ICD-10-CM | POA: Diagnosis not present

## 2017-03-18 DIAGNOSIS — K219 Gastro-esophageal reflux disease without esophagitis: Secondary | ICD-10-CM | POA: Diagnosis not present

## 2017-03-18 DIAGNOSIS — E782 Mixed hyperlipidemia: Secondary | ICD-10-CM | POA: Diagnosis not present

## 2017-03-20 ENCOUNTER — Telehealth: Payer: Self-pay | Admitting: Family Medicine

## 2017-03-20 NOTE — Telephone Encounter (Signed)
Is it okay to give verbal order?

## 2017-03-20 NOTE — Telephone Encounter (Signed)
Copied from [place]. Topic: Quick Communication - See Telephone Encounter >> Mar 20, 2017  2:48 PM Aurelio Brash B wrote: CRM for notification. See Telephone encounter for:  Luna Fuse, OT for [facility],  is asking for verbal orders for nursing and for mobile xray  of left lower extremity and order to continue OT  2 w 4   Her contact number is [phone]   03/20/17.

## 2017-03-21 DIAGNOSIS — L89312 Pressure ulcer of right buttock, stage 2: Secondary | ICD-10-CM | POA: Diagnosis not present

## 2017-03-21 DIAGNOSIS — F411 Generalized anxiety disorder: Secondary | ICD-10-CM | POA: Diagnosis not present

## 2017-03-21 DIAGNOSIS — Z993 Dependence on wheelchair: Secondary | ICD-10-CM | POA: Diagnosis not present

## 2017-03-21 DIAGNOSIS — K219 Gastro-esophageal reflux disease without esophagitis: Secondary | ICD-10-CM | POA: Diagnosis not present

## 2017-03-21 DIAGNOSIS — L89322 Pressure ulcer of left buttock, stage 2: Secondary | ICD-10-CM | POA: Diagnosis not present

## 2017-03-21 DIAGNOSIS — M1990 Unspecified osteoarthritis, unspecified site: Secondary | ICD-10-CM | POA: Diagnosis not present

## 2017-03-21 DIAGNOSIS — J45909 Unspecified asthma, uncomplicated: Secondary | ICD-10-CM | POA: Diagnosis not present

## 2017-03-21 DIAGNOSIS — G8220 Paraplegia, unspecified: Secondary | ICD-10-CM | POA: Diagnosis not present

## 2017-03-21 DIAGNOSIS — Z7951 Long term (current) use of inhaled steroids: Secondary | ICD-10-CM | POA: Diagnosis not present

## 2017-03-21 DIAGNOSIS — E782 Mixed hyperlipidemia: Secondary | ICD-10-CM | POA: Diagnosis not present

## 2017-03-21 DIAGNOSIS — Z7952 Long term (current) use of systemic steroids: Secondary | ICD-10-CM | POA: Diagnosis not present

## 2017-03-21 DIAGNOSIS — G43909 Migraine, unspecified, not intractable, without status migrainosus: Secondary | ICD-10-CM | POA: Diagnosis not present

## 2017-03-21 DIAGNOSIS — G808 Other cerebral palsy: Secondary | ICD-10-CM | POA: Diagnosis not present

## 2017-03-21 NOTE — Telephone Encounter (Signed)
Yes please. Thanks.

## 2017-03-22 DIAGNOSIS — G808 Other cerebral palsy: Secondary | ICD-10-CM | POA: Diagnosis not present

## 2017-03-22 DIAGNOSIS — R32 Unspecified urinary incontinence: Secondary | ICD-10-CM | POA: Diagnosis not present

## 2017-03-22 NOTE — Telephone Encounter (Signed)
Verbal given on VM.

## 2017-03-24 DIAGNOSIS — M1990 Unspecified osteoarthritis, unspecified site: Secondary | ICD-10-CM | POA: Diagnosis not present

## 2017-03-24 DIAGNOSIS — Z7952 Long term (current) use of systemic steroids: Secondary | ICD-10-CM | POA: Diagnosis not present

## 2017-03-24 DIAGNOSIS — G8220 Paraplegia, unspecified: Secondary | ICD-10-CM | POA: Diagnosis not present

## 2017-03-24 DIAGNOSIS — Z993 Dependence on wheelchair: Secondary | ICD-10-CM | POA: Diagnosis not present

## 2017-03-24 DIAGNOSIS — F411 Generalized anxiety disorder: Secondary | ICD-10-CM | POA: Diagnosis not present

## 2017-03-24 DIAGNOSIS — L89312 Pressure ulcer of right buttock, stage 2: Secondary | ICD-10-CM | POA: Diagnosis not present

## 2017-03-24 DIAGNOSIS — G808 Other cerebral palsy: Secondary | ICD-10-CM | POA: Diagnosis not present

## 2017-03-24 DIAGNOSIS — G43909 Migraine, unspecified, not intractable, without status migrainosus: Secondary | ICD-10-CM | POA: Diagnosis not present

## 2017-03-24 DIAGNOSIS — E782 Mixed hyperlipidemia: Secondary | ICD-10-CM | POA: Diagnosis not present

## 2017-03-24 DIAGNOSIS — J45909 Unspecified asthma, uncomplicated: Secondary | ICD-10-CM | POA: Diagnosis not present

## 2017-03-24 DIAGNOSIS — K219 Gastro-esophageal reflux disease without esophagitis: Secondary | ICD-10-CM | POA: Diagnosis not present

## 2017-03-24 DIAGNOSIS — Z7951 Long term (current) use of inhaled steroids: Secondary | ICD-10-CM | POA: Diagnosis not present

## 2017-03-24 DIAGNOSIS — L89322 Pressure ulcer of left buttock, stage 2: Secondary | ICD-10-CM | POA: Diagnosis not present

## 2017-03-28 ENCOUNTER — Telehealth: Payer: Self-pay | Admitting: Family Medicine

## 2017-03-28 DIAGNOSIS — L89312 Pressure ulcer of right buttock, stage 2: Secondary | ICD-10-CM | POA: Diagnosis not present

## 2017-03-28 DIAGNOSIS — E782 Mixed hyperlipidemia: Secondary | ICD-10-CM | POA: Diagnosis not present

## 2017-03-28 DIAGNOSIS — Z993 Dependence on wheelchair: Secondary | ICD-10-CM | POA: Diagnosis not present

## 2017-03-28 DIAGNOSIS — Z7952 Long term (current) use of systemic steroids: Secondary | ICD-10-CM | POA: Diagnosis not present

## 2017-03-28 DIAGNOSIS — G8220 Paraplegia, unspecified: Secondary | ICD-10-CM | POA: Diagnosis not present

## 2017-03-28 DIAGNOSIS — Z7951 Long term (current) use of inhaled steroids: Secondary | ICD-10-CM | POA: Diagnosis not present

## 2017-03-28 DIAGNOSIS — M1990 Unspecified osteoarthritis, unspecified site: Secondary | ICD-10-CM | POA: Diagnosis not present

## 2017-03-28 DIAGNOSIS — G43909 Migraine, unspecified, not intractable, without status migrainosus: Secondary | ICD-10-CM | POA: Diagnosis not present

## 2017-03-28 DIAGNOSIS — J45909 Unspecified asthma, uncomplicated: Secondary | ICD-10-CM | POA: Diagnosis not present

## 2017-03-28 DIAGNOSIS — G808 Other cerebral palsy: Secondary | ICD-10-CM | POA: Diagnosis not present

## 2017-03-28 DIAGNOSIS — M25572 Pain in left ankle and joints of left foot: Secondary | ICD-10-CM | POA: Diagnosis not present

## 2017-03-28 DIAGNOSIS — F411 Generalized anxiety disorder: Secondary | ICD-10-CM | POA: Diagnosis not present

## 2017-03-28 DIAGNOSIS — M7989 Other specified soft tissue disorders: Secondary | ICD-10-CM | POA: Diagnosis not present

## 2017-03-28 DIAGNOSIS — K219 Gastro-esophageal reflux disease without esophagitis: Secondary | ICD-10-CM | POA: Diagnosis not present

## 2017-03-28 DIAGNOSIS — L89322 Pressure ulcer of left buttock, stage 2: Secondary | ICD-10-CM | POA: Diagnosis not present

## 2017-03-28 NOTE — Telephone Encounter (Signed)
Copied from [place] [phone]. Topic: General - Other >> Mar 28, 2017  8:59 AM Yvette Rack wrote: Reason for CRM:  Tillie Rung from [place] [phone] calling for verbal orders for physical Theraphy  Twice a week for 5 weeks Once a week for 2 weeks

## 2017-03-28 NOTE — Telephone Encounter (Signed)
Pls advise.  

## 2017-03-28 NOTE — Telephone Encounter (Signed)
Copied from [place] [phone]. Topic: General - Other >> Mar 28, 2017  8:59 AM Yvette Rack wrote: Reason for CRM:  Tillie Rung from [place] [phone] calling for verbal orders for physical Theraphy  Twice a week for 5 weeks Once a week for 2 weeks

## 2017-03-29 NOTE — Telephone Encounter (Signed)
LMOVM for [facility] with verbal orders for PT 2xweek for 5 weeks Once a week for 2 weeks.

## 2017-03-29 NOTE — Telephone Encounter (Signed)
Fine

## 2017-04-03 ENCOUNTER — Telehealth: Payer: Self-pay | Admitting: Family Medicine

## 2017-04-03 NOTE — Telephone Encounter (Signed)
Copied from [place]. Topic: Quick Communication - See Telephone Encounter >> Apr 03, 2017  1:36 PM Vernona Rieger wrote: CRM for notification. See Telephone encounter for:   04/03/17.  Tillie Rung from [facility] called and said that the patients insurance will not cover any more PT & OT. She has been discharged.  She has a wound on her bottom that nobody has been able to look, her insurance denied wound care. It is not being addressed. It has been three times that the Physical Therapist has tried to look at her bottom, she denied it. Call back [phone]

## 2017-04-03 NOTE — Telephone Encounter (Signed)
Copied from [place] [phone]. Topic: General - Other >> Apr 03, 2017  8:52 AM Darl Householder, RMA wrote: Reason for CRM: Marissa from [facility] is requesting an order for an MRI and to let Dr. Brigitte Pulse know that x-ray did nor show any fracture only osteoarthritis and arthritis, pt is still in pain and is requesting an MRI, please return call [phone]

## 2017-04-04 NOTE — Telephone Encounter (Signed)
Please Advise

## 2017-04-05 DIAGNOSIS — B349 Viral infection, unspecified: Secondary | ICD-10-CM | POA: Diagnosis not present

## 2017-04-05 DIAGNOSIS — L509 Urticaria, unspecified: Secondary | ICD-10-CM | POA: Diagnosis not present

## 2017-04-06 NOTE — Telephone Encounter (Signed)
Left detailed message per Hippa Release letting pt know MD notes and to call us and let us know which route she wants to take.

## 2017-04-06 NOTE — Telephone Encounter (Signed)
There is no way her insurance will authorize an MRI without in office evaluation be a provider w/ the appropriate documentation - will be happy to refer to ortho or can come in here for eval.

## 2017-04-06 NOTE — Telephone Encounter (Signed)
Sounds like pt is in a difficult situation so let us know what we can do to help please.  Would pt like a referral to the outpatient wound clinic? Is home health nursing still helping with this even though she is out of PT/OT sessions? How many times a week is nursing doing wound care?

## 2017-04-07 NOTE — Telephone Encounter (Signed)
Spoke to [facility] discharged pt 03/24/2017 due to ins denying services. Spoke with pt.  She states her ins only covers 20 visits of PT/OT. Made appt for Sat 2/9 for pt to evaluate L/ankle-Pt states "ACL" tear.  She will obtain report from mobile xray-ankle views. Pt states she was treated at Urgent care for allergic reaction-given IM steroids and PO steroids.

## 2017-04-07 NOTE — Telephone Encounter (Signed)
Sounds good. Thank you

## 2017-04-08 ENCOUNTER — Other Ambulatory Visit: Payer: Self-pay

## 2017-04-08 ENCOUNTER — Encounter: Payer: Self-pay | Admitting: Family Medicine

## 2017-04-08 ENCOUNTER — Ambulatory Visit: Payer: BLUE CROSS/BLUE SHIELD | Admitting: Family Medicine

## 2017-04-08 VITALS — BP 135/84 | HR 101 | Temp 99.2°F | Resp 17 | Ht 60.0 in | Wt 140.0 lb

## 2017-04-08 DIAGNOSIS — M25572 Pain in left ankle and joints of left foot: Secondary | ICD-10-CM

## 2017-04-08 DIAGNOSIS — G808 Other cerebral palsy: Secondary | ICD-10-CM

## 2017-04-08 DIAGNOSIS — J029 Acute pharyngitis, unspecified: Secondary | ICD-10-CM

## 2017-04-08 DIAGNOSIS — J069 Acute upper respiratory infection, unspecified: Secondary | ICD-10-CM

## 2017-04-08 DIAGNOSIS — L8940 Pressure ulcer of contiguous site of back, buttock and hip, unspecified stage: Secondary | ICD-10-CM | POA: Diagnosis not present

## 2017-04-08 DIAGNOSIS — R509 Fever, unspecified: Secondary | ICD-10-CM | POA: Diagnosis not present

## 2017-04-08 LAB — POC INFLUENZA A&B (BINAX/QUICKVUE)
INFLUENZA A, POC: NEGATIVE
INFLUENZA B, POC: NEGATIVE

## 2017-04-08 LAB — POCT RAPID STREP A (OFFICE): RAPID STREP A SCREEN: NEGATIVE

## 2017-04-08 MED ORDER — PROMETHAZINE-DM 6.25-15 MG/5ML PO SYRP: 5.0000 mL | ORAL_SOLUTION | ORAL | 0 refills | Status: DC | PRN

## 2017-04-08 MED ORDER — BENZONATATE 200 MG PO CAPS: 200.0000 mg | ORAL_CAPSULE | Freq: Three times a day (TID) | ORAL | 3 refills | Status: DC | PRN

## 2017-04-08 MED ORDER — BUTALBITAL-APAP-CAFFEINE 50-325-40 MG PO TABS: 1.0000 | ORAL_TABLET | ORAL | 3 refills | Status: DC | PRN

## 2017-04-08 NOTE — Patient Instructions (Addendum)
Try the fioricet for the headache.  Try the promethazine-DM syrup instead of the nyquil.  I suspect you have a severe viral infection - may have even been the flu.  Try using your Advair too to ensure your lungs and upper airway stay open after you finish the 5d oral prednisone course.  I will retry another order for home health wound care.  If you don't hear about the MRI in 1-2 weeks, please call us.   IF you received an x-ray today, you will receive an invoice from [facility]. Please contact [facility] at [phone] with questions or concerns regarding your invoice.   IF you received labwork today, you will receive an invoice from [place]. Please contact [facility] at [phone] with questions or concerns regarding your invoice.   Our billing staff will not be able to assist you with questions regarding bills from these companies.  You will be contacted with the lab results as soon as they are available. The fastest way to get your results is to activate your My Chart account. Instructions are located on the last page of this paperwork. If you have not heard from [place] regarding the results in 2 weeks, please contact this office.     Upper Respiratory Infection, Adult Most upper respiratory infections (URIs) are caused by a virus. A URI affects the nose, throat, and upper air passages. The most common type of URI is often called "the common cold." Follow these instructions at home:  Take medicines only as told by your doctor.  Gargle warm saltwater or take cough drops to comfort your throat as told by your doctor.  Use a warm mist humidifier or inhale steam from a shower to increase air moisture. This may make it easier to breathe.  Drink enough fluid to keep your pee (urine) clear or pale yellow.  Eat soups and other clear broths.  Have a healthy diet.  Rest as needed.  Go back to work when your fever is gone or your doctor says it is okay. ? You may need  to stay home longer to avoid giving your URI to others. ? You can also wear a face mask and wash your hands often to prevent spread of the virus.  Use your inhaler more if you have asthma.  Do not use any tobacco products, including cigarettes, chewing tobacco, or electronic cigarettes. If you need help quitting, ask your doctor. Contact a doctor if:  You are getting worse, not better.  Your symptoms are not helped by medicine.  You have chills.  You are getting more short of breath.  You have brown or red mucus.  You have yellow or brown discharge from your nose.  You have pain in your face, especially when you bend forward.  You have a fever.  You have puffy (swollen) neck glands.  You have pain while swallowing.  You have white areas in the back of your throat. Get help right away if:  You have very bad or constant: ? Headache. ? Ear pain. ? Pain in your forehead, behind your eyes, and over your cheekbones (sinus pain). ? Chest pain.  You have long-lasting (chronic) lung disease and any of the following: ? Wheezing. ? Long-lasting cough. ? Coughing up blood. ? A change in your usual mucus.  You have a stiff neck.  You have changes in your: ? Vision. ? Hearing. ? Thinking. ? Mood. This information is not intended to replace advice given to you by your health  care provider. Make sure you discuss any questions you have with your health care provider. Document Released: 08/03/2007 Document Revised: 10/18/2015 Document Reviewed: 05/22/2013 Elsevier Interactive Patient Education  2018 [employer].

## 2017-04-08 NOTE — Progress Notes (Addendum)
Subjective:  By signing my name below, I, Essence Howell, attest that this documentation has been prepared under the direction and in the presence of [name], MD Electronically Signed: Ladene Artist, ED Scribe 04/08/2017 at 12:31 PM.   Patient ID: Carolyn Pena, female    DOB: [birth year], 42 y.o.   MRN: [id number]  Chief Complaint  Patient presents with  . ? tendon/ligament tear/ L ankle    referral for mri, allergic rx recheck, seen wednesday at [facility], had fever, no bloodwork or flu or strep test done.  Pt still having low grade fevers, having severe vertigo and ha's.   Started on Prednisone for allergic rxn on Wednesday, per pt she is on a daily regimen of prednisone not sure what was given to her IM at [facility]   HPI Carolyn Pena is a 42 y.o. female who presents to Primary Care at [facility] complaining of ongoing low-grade fevers, HAs and vertigo. Pt was seen at [facility] 3 days ago for hives, fever, dry cough, sore throat, generalized body aches that she describes as a burning sensation, vertigo, HA/pressure x 5 days, swollen/closing throat sensation 3 days ago. Reports that she lost her voice within 2 minutes and had oral and eye swelling which has since resolved. Pt states that she was not tested for flu of strep. She was given injection and 5 days of Prednisone. Has been alternating Benadryl and NyQuil without relief but discontinued Benadryl 3 days ago. She has not tried Fioricet for these symptoms. Advair prn which she has not recently needed. Denies chest tightness, wheezing, change in foods, meds, cleaning products.  L Ankle Pain Pt started weight bearing from her chair and was doing squats ~3.5 wks ago. States she stood for the third repetition when she felt a pop, noticed sudden onset of a burning sensation out the otter aspect of her L ankle and swelling. Also reports there was yellow-ish discoloration that appeared as the swelling decreased. She reports that L ankle pain has  improved, however, she still has pain with palpitation and swelling. Pt requests a referral for MRI. She does have pins and rods in her R hip.  Does not take Lasix regularly, only when edema is bad. States she only has 20 PT/OT. 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 She is not getting wound care; states they are struggling with the definition of "home bound".  Past Medical History:  Diagnosis Date  . Allergy   . Anemia   . Anxiety   . Arthritis   . Asthma   . Chronic paraplegia ([place])   . CP (cerebral palsy) ([place])   . Depression   . H/O multiple concussions   . Neuromuscular disorder [facility])    Current Outpatient Medications on File Prior to Visit  Medication Sig Dispense Refill  . albuterol (PROVENTIL HFA;VENTOLIN HFA) 108 (90 Base) MCG/ACT inhaler Inhale 2 puffs into the lungs every 6 (six) hours as needed for wheezing or shortness of breath. 18 g 11  . albuterol (PROVENTIL) (2.5 MG/3ML) 0.083% nebulizer solution Take 3 mLs (2.5 mg total) by nebulization every 4 (four) hours as needed for wheezing or shortness of breath. 150 mL 5  . benzonatate (TESSALON) 200 MG capsule Take 1 capsule (200 mg total) by mouth 3 (three) times daily as needed for cough. 60 capsule 3  . busPIRone (BUSPAR) 5 MG tablet Take 1  tablet (5 mg total) by mouth 2 (two) times daily. 180 tablet 1  . butalbital-acetaminophen-caffeine (FIORICET, ESGIC) 50-325-40 MG tablet Take 1 tablet by mouth every 4 (four) hours as needed for heart rate control.    . clonazePAM (KLONOPIN) 1 MG tablet Take 1 tablet (1 mg total) by mouth 2 (two) times daily. 60 tablet 5  . diclofenac (VOLTAREN) 50 MG EC tablet Take 50 mg by mouth 2 (two) times daily.    . diphenoxylate-atropine (LOMOTIL) 2.5-0.025 MG tablet TK 1 T PO QID PRF DIARRHEA.  0  . ergocalciferol (VITAMIN D2) 50000 units capsule Take 1 capsule (50,000 Units total) by mouth once a week.  (Patient taking differently: Take 50,000 Units by mouth once a week. Take on sun) 12 capsule 1  . esomeprazole (NEXIUM) 40 MG capsule Take 1 capsule (40 mg total) by mouth daily at 12 noon. 30 capsule 11  . etonogestrel (NEXPLANON) 68 MG IMPL implant 1 each by Subdermal route once.    . Fluticasone-Salmeterol (ADVAIR) 100-50 MCG/DOSE AEPB Inhale 1 puff into the lungs 2 (two) times daily. 60 each 5  . furosemide (LASIX) 40 MG tablet Take 1 tablet (40 mg total) by mouth 2 (two) times daily. 1 tablet 0  . gabapentin (NEURONTIN) 300 MG capsule Take 2 capsules (600 mg total) by mouth 3 (three) times daily. 540 capsule 1  . HYDROmorphone (DILAUDID) 4 MG tablet Take 1 tablet (4 mg total) by mouth every 4 (four) hours as needed for severe pain. 40 tablet 0  . ibuprofen (ADVIL,MOTRIN) 800 MG tablet Take 1 tablet (800 mg total) by mouth every 8 (eight) hours as needed for moderate pain. 30 tablet 0  . ketorolac (TORADOL) 30 MG/ML injection Inject 1 mL (30 mg total) into the muscle once. 10 mL 2  . levocetirizine (XYZAL) 5 MG tablet Take 5 mg by mouth every evening.    . methocarbamol (ROBAXIN) 500 MG tablet Take 500 mg by mouth 3 (three) times daily.    . mupirocin ointment (BACTROBAN) 2 % Apply 1 application topically 2 (two) times daily. 30 g 1  . ondansetron (ZOFRAN-ODT) 4 MG disintegrating tablet Take 1-2 tablets (4-8 mg total) by mouth every 8 (eight) hours as needed for nausea or vomiting. 30 tablet 2  . polyethylene glycol powder (GLYCOLAX/MIRALAX) powder Take 17 g by mouth daily. 500 g 1  . potassium chloride SA (K-DUR,KLOR-CON) 20 MEQ tablet Take 1 tablet (20 mEq total) by mouth 2 (two) times daily. 1 tablet 0  . predniSONE (DELTASONE) 5 MG tablet Take 1 tablet (5 mg total) by mouth daily with breakfast. 30 tablet 1  . promethazine-dextromethorphan (PROMETHAZINE-DM) 6.25-15 MG/5ML syrup Take 5 mLs by mouth 4 (four) times daily as needed for cough. 180 mL 0  . rizatriptan (MAXALT) 5 MG tablet Take 1  tablet (5 mg total) by mouth as needed for migraine. 10 tablet 7  . sertraline (ZOLOFT) 100 MG tablet Take 2 tablets (200 mg total) by mouth daily. 1 tablet 0  . traMADol (ULTRAM) 50 MG tablet Take 1 tablet (50 mg total) by mouth every 6 (six) hours as needed. (Patient taking differently: Take 50 mg by mouth every 6 (six) hours as needed for moderate pain. ) 120 tablet 0  . traZODone (DESYREL) 50 MG tablet Take 1-3 tablets (50-150 mg total) by mouth at bedtime as needed. 30 tablet 3   No current facility-administered medications on file prior to visit.    Allergies  Allergen Reactions  . Codeine  Nausea And Vomiting  . Lidocaine Swelling    Had steroid injection doesn't know if it was the steroid or lidocaine. "Whole arm swelled & warm to touch. Severe inflamation."   . Lortab [Hydrocodone-Acetaminophen] Nausea And Vomiting  . Augmentin [Amoxicillin-Pot Clavulanate] Nausea And Vomiting  . Methylprednisolone Rash    Had lidocaine as well   Past Surgical History:  Procedure Laterality Date  . EYE SURGERY    . JOINT REPLACEMENT     Family History  Problem Relation Age of Onset  . Cancer Mother        CLL, decsd in 2012  . Breast cancer Neg Hx    Social History   Socioeconomic History  . Marital status: Single    Spouse name: None  . Number of children: None  . Years of education: None  . Highest education level: None  Social Needs  . Financial resource strain: None  . Food insecurity - worry: None  . Food insecurity - inability: None  . Transportation needs - medical: None  . Transportation needs - non-medical: None  Occupational History  . None  Tobacco Use  . Smoking status: Never Smoker  . Smokeless tobacco: Never Used  Substance and Sexual Activity  . Alcohol use: None  . Drug use: No  . Sexual activity: None  Other Topics Concern  . None  Social History Narrative   Pt has doctorate degree   Depression screen [facility] 2/9 04/08/2017 12/03/2016 06/21/2015 06/18/2015  06/15/2015  Decreased Interest 0 0 0 0 0  Down, Depressed, Hopeless 0 0 0 0 0  PHQ - 2 Score 0 0 0 0 0    Review of Systems  Constitutional: Positive for fever.  HENT: Positive for sore throat.   Respiratory: Positive for cough. Negative for chest tightness and wheezing.   Musculoskeletal: Positive for arthralgias, joint swelling and myalgias.  Skin: Positive for color change and rash.  Neurological: Positive for dizziness and headaches.      Objective:   Physical Exam  Constitutional: She is oriented to person, place, and time. She appears well-developed and well-nourished. No distress.  HENT:  Head: Normocephalic and atraumatic.  Right Ear: Tympanic membrane normal.  Left Ear: Tympanic membrane normal.  Nose: Mucosal edema and rhinorrhea present.  Mouth/Throat: Oropharynx is clear and moist and mucous membranes are normal.  Postnasal drip.  Eyes: Conjunctivae and EOM are normal.  Neck: Neck supple. No tracheal deviation present.  Cardiovascular: Normal rate and regular rhythm.  Pulmonary/Chest: Effort normal and breath sounds normal. No respiratory distress.  Musculoskeletal: Normal range of motion.  L ankle: Significant tenderness to palpation anterior to lateral malleolus. Positive squeeze test. Still with swelling over medial malleolus.  Neurological: She is alert and oriented to person, place, and time. She displays atrophy. No sensory deficit. She exhibits abnormal muscle tone.  Pt in power wheelchair, severe contracture of B lower extremities with absolutely no independent movement and very little passive movement.  Skin: Skin is warm. She is diaphoretic.  Psychiatric: She has a normal mood and affect. Her behavior is normal.  Nursing note and vitals reviewed.  BP 135/84 (BP Location: Left Arm, Patient Position: Sitting, Cuff Size: Normal)   Pulse (!) 101   Temp 99.2 F (37.3 C) (Oral)   Resp 17   Ht 5' (1.524 m)   Wt 140 lb (63.5 kg)   LMP 03/05/2017   SpO2 98%    BMI 27.34 kg/m     Results for orders placed or  performed in visit on 04/08/17  POC Influenza A&B(BINAX/QUICKVUE)  Result Value Ref Range   Influenza A, POC Negative Negative   Influenza B, POC Negative Negative  POCT rapid strep A  Result Value Ref Range   Rapid Strep A Screen Negative Negative   03/28/2017: [facility] [address] Ankle 3 view (left). Patient ID: [id number] Findings: There is ankle swelling. There is fusion at the tibiotalar joint, osteoporosis, osteoarthritis and no acute fracture. 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 Interpreting doctor: Adria Devon MD at [facility]. Assessment & Plan:   1. Fever, unspecified   2. Acute pharyngitis, unspecified etiology - suspect viral, continue symptomatic care  3. Pressure injury of skin of contiguous region involving buttock and hip, unspecified injury stage, unspecified laterality - pt is homebound - does not have transportation to o/p wound care - so refer for home health nursing for wound care  4. Acute left ankle pain - pt with acute injury during PT when weightbearing and rotated on foot - pop and sudden severe pain ~j1/14/19 that developed bruising about a week later and now 1 mo later still with severe point tenderness anterior and distal to lateral left malleolus and unable to bear weight and no active ROM, minimal PROM, severe contracture at baseline. Xray done ~ 2/3 (copy of read scanned under Media tab) showed only sig degenerative change. I suspect pt did ruputure a ligament so needs MRI for further eval.  Pt reports when ortho ordered MRI 05/2016 one of the outpt imaging centers told her they were unable to help her up onto the MRI table so imaging had to be rescheduled at [facility] even though it would be $2000 less for her to do at an outpt imaging center  5. Acute upper respiratory infection   6.      Cerebral palsy, hemiplegic - pt having significant trouble getting home  health - she is bound to her chair unless she has someone to help her transfer to and from her bed but is having tons of problems getting insurance to cover. Will place C3 referral to see if Elmyra Ricks can help pt find resources for home health wound care, transportation, and home aides.  Orders Placed This Encounter  Procedures  . Culture, Group A Strep    Order Specific Question:   Source    Answer:   pharyngitis  . MR ANKLE LEFT WO CONTRAST    Standing Status:   Future    Standing Expiration Date:   06/07/2018    Scheduling Instructions:     Pt with CP will need assistance transferring from her chair onto the MRI bed - please ensure that they can help her do this. If they can't at GI, then will need to change imaging order to [facility] (where they did her MRI 05/2016    Order Specific Question:   What is the patient's sedation requirement?    Answer:   No Sedation    Order Specific Question:   Does the patient have a pacemaker or implanted devices?    Answer:   Yes    Comments:   h/o Rt hip osteotomy with pins    Order Specific Question:   Preferred imaging location?    Answer:   [facility] (table limit-550lbs)    Order Specific Question:   Radiology Contrast Protocol - do NOT remove file path    Answer:   \\[email or web address]  . POC Influenza A&B(BINAX/QUICKVUE)  .  POCT rapid strep A    Meds ordered this encounter  Medications  . benzonatate (TESSALON) 200 MG capsule    Sig: Take 1 capsule (200 mg total) by mouth 3 (three) times daily as needed for cough.    Dispense:  60 capsule    Refill:  3  . butalbital-acetaminophen-caffeine (FIORICET, ESGIC) 50-325-40 MG tablet    Sig: Take 1 tablet by mouth every 4 (four) hours as needed for headache.    Dispense:  14 tablet    Refill:  3  . promethazine-dextromethorphan (PROMETHAZINE-DM) 6.25-15 MG/5ML syrup    Sig: Take 5 mLs by mouth every 4 (four) hours as needed for cough.    Dispense:  180 mL     Refill:  0    I personally performed the services described in this documentation, which was scribed in my presence. The recorded information has been reviewed and considered, and addended by me as needed.   [name], M.D.  Primary Care at [facility] [address] [place], [place] [address] [phone] phone [phone] fax  04/10/17 12:18 AM

## 2017-04-11 LAB — CULTURE, GROUP A STREP: STREP A CULTURE: NEGATIVE

## 2017-04-12 ENCOUNTER — Encounter: Payer: Self-pay | Admitting: Family Medicine

## 2017-04-14 ENCOUNTER — Telehealth: Payer: Self-pay | Admitting: Family Medicine

## 2017-04-14 NOTE — Telephone Encounter (Signed)
Spoke with patient, states that she would like to have nexplanon removed and a new one reinserted. Patient states that her old one is expired. Per patient, would like done during OV with PCP. Spoke with Dr Brigitte Pulse, states that insurance may need to approve, sent to PA pool

## 2017-04-14 NOTE — Telephone Encounter (Signed)
Copied from [place]. Topic: Quick Communication - See Telephone Encounter >> Apr 14, 2017 11:35 AM Bea Graff, NT wrote: CRM for notification. See Telephone encounter for: PT would like a call to confirm the process for having her birth control implant removed and replaced. Requesting a call back.   04/14/17.

## 2017-04-20 ENCOUNTER — Telehealth: Payer: Self-pay | Admitting: Family Medicine

## 2017-04-20 NOTE — Telephone Encounter (Signed)
Pt is calling and wanting

## 2017-04-21 ENCOUNTER — Telehealth: Payer: Self-pay | Admitting: Family Medicine

## 2017-04-21 NOTE — Telephone Encounter (Signed)
Carolyn Pena - works with [employer] and provides support services for Carolyn Pena came in today and said that the [facility] needs the original copies of the application for disability parking placard.  Pt has an appt with shaw tomorrow 04/22/17

## 2017-04-21 NOTE — Telephone Encounter (Signed)
PT HAS APPOINTMENT TOMORROW Sat 2/23 and no info on whether it is ok to place her nexplanon at that time - can someone look into this immediately so pt can be rescheduled if we need to get a PA on her insurance?  Transportation is VERY difficult for her since in power wheelchair so would feel horrible to have her come all the way here for her visit to be told we haven't done the needed paperwork yet. .  . . If Nexplanon does need PA, ok to take out tomorrow and replace at a different visit or just reschedule visit for a later date to be done at once - whatever pt's preference - she just needs to be made aware prior to her visit tomorrow.

## 2017-04-21 NOTE — Telephone Encounter (Signed)
Nexplanon on formulary. Nothing further needed.  Chanda found:  We have Carolyn Pena listed as insurance and they did confirm her policy is active. Based on the note regarding existing implant, I requested PA for removal and reinsertion of Nexplanon. They stated her plan does not require PA for this. If you need anything else, let me know!

## 2017-04-22 ENCOUNTER — Encounter: Payer: Self-pay | Admitting: Family Medicine

## 2017-04-22 ENCOUNTER — Ambulatory Visit: Payer: BLUE CROSS/BLUE SHIELD | Admitting: Family Medicine

## 2017-04-22 ENCOUNTER — Other Ambulatory Visit: Payer: Self-pay

## 2017-04-22 VITALS — BP 123/70 | HR 94 | Temp 99.1°F | Resp 18 | Ht 60.0 in

## 2017-04-22 DIAGNOSIS — R7982 Elevated C-reactive protein (CRP): Secondary | ICD-10-CM | POA: Diagnosis not present

## 2017-04-22 DIAGNOSIS — Z3046 Encounter for surveillance of implantable subdermal contraceptive: Secondary | ICD-10-CM

## 2017-04-22 DIAGNOSIS — H8112 Benign paroxysmal vertigo, left ear: Secondary | ICD-10-CM | POA: Diagnosis not present

## 2017-04-22 DIAGNOSIS — R5382 Chronic fatigue, unspecified: Secondary | ICD-10-CM | POA: Diagnosis not present

## 2017-04-22 DIAGNOSIS — Z30017 Encounter for initial prescription of implantable subdermal contraceptive: Secondary | ICD-10-CM

## 2017-04-22 DIAGNOSIS — E559 Vitamin D deficiency, unspecified: Secondary | ICD-10-CM | POA: Diagnosis not present

## 2017-04-22 DIAGNOSIS — N938 Other specified abnormal uterine and vaginal bleeding: Secondary | ICD-10-CM

## 2017-04-22 DIAGNOSIS — D649 Anemia, unspecified: Secondary | ICD-10-CM | POA: Diagnosis not present

## 2017-04-22 NOTE — Patient Instructions (Signed)
     IF you received an x-ray today, you will receive an invoice from [facility]. Please contact [facility] at [phone] with questions or concerns regarding your invoice.   IF you received labwork today, you will receive an invoice from [facility]. Please contact [facility] at [phone] with questions or concerns regarding your invoice.   Our billing staff will not be able to assist you with questions regarding bills from these companies.  You will be contacted with the lab results as soon as they are available. The fastest way to get your results is to activate your My Chart account. Instructions are located on the last page of this paperwork. If you have not heard from us regarding the results in 2 weeks, please contact this office.     

## 2017-04-22 NOTE — Progress Notes (Signed)
By signing my name below, I, Carolyn Pena, attest that this documentation has been prepared under the direction and in the presence of Carolyn Pena Arrow, MD. Electronically Signed: Mayer Pena, Medical Scribe 04/22/2017 at 10:25 AM. Subjective:    Patient ID: Carolyn Pena, female    DOB: 03/14/1975, 42 y.o.   MRN: [id number]  HPI Chief Complaint  Patient presents with  . Contraception    right arm,Pt states she is having nexplanon removed and would like a new one put in.  . Paperwork    Pt states she needs a new disability placard because disability wouldn't take the faxed copy.   Carolyn Pena is a 42 y.o. female who presents to Primary Care at [facility] requesting a new nexplanon. She has had nexplanon twice before and it works well for her. She reports having pelvic pain for 6 months. She denies vaginal bleeding, but has occasional spotting after having her period.   Vertigo: She is also having vertigo, especially when turning her head to the left and with certain positional changes. She also reports feeling extremely lethargic for 3 months.   Past Medical History:  Diagnosis Date  . Allergy   . Anemia   . Anxiety   . Arthritis   . Asthma   . Chronic paraplegia ([place])   . CP (cerebral palsy) ([place])   . Depression   . H/O multiple concussions   . Neuromuscular disorder [facility])    Past Surgical History:  Procedure Laterality Date  . EYE SURGERY    . JOINT REPLACEMENT     Current Outpatient Medications on File Prior to Visit  Medication Sig Dispense Refill  . albuterol (PROVENTIL HFA;VENTOLIN HFA) 108 (90 Base) MCG/ACT inhaler Inhale 2 puffs into the lungs every 6 (six) hours as needed for wheezing or shortness of breath. 18 g 11  . albuterol (PROVENTIL) (2.5 MG/3ML) 0.083% nebulizer solution Take 3 mLs (2.5 mg total) by nebulization every 4 (four) hours as needed for wheezing or shortness of breath. 150 mL 5  . benzonatate (TESSALON) 200 MG capsule Take 1 capsule (200 mg total) by  mouth 3 (three) times daily as needed for cough. 60 capsule 3  . busPIRone (BUSPAR) 5 MG tablet Take 1 tablet (5 mg total) by mouth 2 (two) times daily. 180 tablet 1  . butalbital-acetaminophen-caffeine (FIORICET, ESGIC) 50-325-40 MG tablet Take 1 tablet by mouth every 4 (four) hours as needed for headache. 14 tablet 3  . clonazePAM (KLONOPIN) 1 MG tablet Take 1 tablet (1 mg total) by mouth 2 (two) times daily. 60 tablet 5  . diclofenac (VOLTAREN) 50 MG EC tablet Take 50 mg by mouth 2 (two) times daily.    . diphenoxylate-atropine (LOMOTIL) 2.5-0.025 MG tablet TK 1 T PO QID PRF DIARRHEA.  0  . ergocalciferol (VITAMIN D2) 50000 units capsule Take 1 capsule (50,000 Units total) by mouth once a week. (Patient taking differently: Take 50,000 Units by mouth once a week. Take on sun) 12 capsule 1  . esomeprazole (NEXIUM) 40 MG capsule Take 1 capsule (40 mg total) by mouth daily at 12 noon. 30 capsule 11  . etonogestrel (NEXPLANON) 68 MG IMPL implant 1 each by Subdermal route once.    . Fluticasone-Salmeterol (ADVAIR) 100-50 MCG/DOSE AEPB Inhale 1 puff into the lungs 2 (two) times daily. 60 each 5  . furosemide (LASIX) 40 MG tablet Take 1 tablet (40 mg total) by mouth 2 (two) times daily. 1 tablet 0  . gabapentin (NEURONTIN) 300 MG capsule  Take 2 capsules (600 mg total) by mouth 3 (three) times daily. 540 capsule 1  . HYDROmorphone (DILAUDID) 4 MG tablet Take 1 tablet (4 mg total) by mouth every 4 (four) hours as needed for severe pain. 40 tablet 0  . ibuprofen (ADVIL,MOTRIN) 800 MG tablet Take 1 tablet (800 mg total) by mouth every 8 (eight) hours as needed for moderate pain. 30 tablet 0  . ketorolac (TORADOL) 30 MG/ML injection Inject 1 mL (30 mg total) into the muscle once. 10 mL 2  . levocetirizine (XYZAL) 5 MG tablet Take 5 mg by mouth every evening.    . methocarbamol (ROBAXIN) 500 MG tablet Take 500 mg by mouth 3 (three) times daily.    . mupirocin ointment (BACTROBAN) 2 % Apply 1 application  topically 2 (two) times daily. 30 g 1  . ondansetron (ZOFRAN-ODT) 4 MG disintegrating tablet Take 1-2 tablets (4-8 mg total) by mouth every 8 (eight) hours as needed for nausea or vomiting. 30 tablet 2  . polyethylene glycol powder (GLYCOLAX/MIRALAX) powder Take 17 g by mouth daily. 500 g 1  . potassium chloride SA (K-DUR,KLOR-CON) 20 MEQ tablet Take 1 tablet (20 mEq total) by mouth 2 (two) times daily. 1 tablet 0  . predniSONE (DELTASONE) 5 MG tablet Take 1 tablet (5 mg total) by mouth daily with breakfast. 30 tablet 1  . promethazine-dextromethorphan (PROMETHAZINE-DM) 6.25-15 MG/5ML syrup Take 5 mLs by mouth every 4 (four) hours as needed for cough. 180 mL 0  . rizatriptan (MAXALT) 5 MG tablet Take 1 tablet (5 mg total) by mouth as needed for migraine. 10 tablet 7  . sertraline (ZOLOFT) 100 MG tablet Take 2 tablets (200 mg total) by mouth daily. 1 tablet 0  . traMADol (ULTRAM) 50 MG tablet Take 1 tablet (50 mg total) by mouth every 6 (six) hours as needed. (Patient taking differently: Take 50 mg by mouth every 6 (six) hours as needed for moderate pain. ) 120 tablet 0  . traZODone (DESYREL) 50 MG tablet Take 1-3 tablets (50-150 mg total) by mouth at bedtime as needed. 30 tablet 3   No current facility-administered medications on file prior to visit.    Allergies  Allergen Reactions  . Codeine Nausea And Vomiting  . Lidocaine Swelling    Had steroid injection doesn't know if it was the steroid or lidocaine. "Whole arm swelled & warm to touch. Severe inflamation."   . Lortab [Hydrocodone-Acetaminophen] Nausea And Vomiting  . Augmentin [Amoxicillin-Pot Clavulanate] Nausea And Vomiting  . Methylprednisolone Rash    Had lidocaine as well   Family History  Problem Relation Age of Onset  . Cancer Mother        CLL, decsd in 2012  . Breast cancer Neg Hx    Social History   Socioeconomic History  . Marital status: Single    Spouse name: None  . Number of children: None  . Years of  education: None  . Highest education level: None  Social Needs  . Financial resource strain: None  . Food insecurity - worry: None  . Food insecurity - inability: None  . Transportation needs - medical: None  . Transportation needs - non-medical: None  Occupational History  . None  Tobacco Use  . Smoking status: Never Smoker  . Smokeless tobacco: Never Used  Substance and Sexual Activity  . Alcohol use: None  . Drug use: No  . Sexual activity: None  Other Topics Concern  . None  Social History Narrative  Pt has doctorate degree   Depression screen [facility] 2/9 04/22/2017 04/08/2017 12/03/2016 06/21/2015 06/18/2015  Decreased Interest 0 0 0 0 0  Down, Depressed, Hopeless 0 0 0 0 0  PHQ - 2 Score 0 0 0 0 0    Review of Systems  Constitutional: Positive for fatigue. Negative for fever.  Cardiovascular: Positive for leg swelling.  Genitourinary: Positive for pelvic pain. Negative for vaginal bleeding.  Musculoskeletal: Positive for arthralgias, gait problem, joint swelling and myalgias.  Neurological: Positive for dizziness and weakness.       Objective:   Physical Exam  Constitutional: She is oriented to person, place, and time. She appears well-developed and well-nourished. No distress.  HENT:  Head: Normocephalic and atraumatic.  Right Ear: External ear normal.  Eyes: Conjunctivae are normal. No scleral icterus.  Pulmonary/Chest: Effort normal.  Musculoskeletal: She exhibits edema.  Neurological: She is alert and oriented to person, place, and time.  Skin: Skin is warm and dry. She is not diaphoretic. No erythema.  Psychiatric: She has a normal mood and affect. Her behavior is normal.   BP 123/70 (BP Location: Left Arm, Patient Position: Sitting, Cuff Size: Normal)   Pulse 94   Temp 99.1 F (37.3 C) (Oral)   Resp 18   Ht 5' (1.524 m)   LMP 03/03/2017   SpO2 98%   BMI 27.34 kg/m   Right arm positioned with shoulder abducted to 90 deg and externally rotated with elbow  at >90 deg.  Nexplanon palpable along inner upper arm, with distal end several inches from medial epicondyle. Distal end marked and incision site immediately above and parallel to length of arm marked with sterile skin surgical marker. Area cleaned with betadine x 3.  Subcutaneous anesthesia with 5cc of 1% lidocaine with epi around and under distal end of Nexplanon with good result.  13m incision made with 11 blade. Distal end of Nexplanon emerged through incision with manipulation of proximal end of Nexplanon.  Grasped with hemostats to slowly remove the device.  Nexplanon removed in tact with length 4 inches.  New Nexplanon device opened and large bore needle inserted into incision site then advanced proximally along subdermal layer. Device needle retracted and insertion device was removed. Incision site closed with a steri-strip. New nexplanon easily palpable. Pressure bandage applied which pt will keep in place x 24 hrs to minimize hematomas and contusions. Pt tolerated procedure well. EBL <3cc. No complications.  Post-op wound care reviewed in detail and all questions answered.      Assessment & Plan:   1. Dysfunctional uterine bleeding - has done very well on nexplanon x 6 yrs - 3rd nexplanon inserted today as the one removed expired 3 yrs prior per pt.  2. Insertion of implantable subdermal contraceptive   3. Encounter for removal of subdermal contraceptive implant   4. Benign paroxysmal positional vertigo of left ear - since sinusitis from last visit - did not have time to evaluate etiology today - need return visit  5. Chronic fatigue   6. Elevated C-reactive protein (CRP)   7. Anemia, unspecified type   8. Vitamin D deficiency     Orders Placed This Encounter  Procedures  . CBC with Differential/Platelet  . C-reactive protein  . Sedimentation Rate  . VITAMIN D 25 Hydroxy (Vit-D Deficiency, Fractures)  . Comprehensive metabolic panel  . Vitamin B12  . POCT urinalysis dipstick  .  POCT Microscopic Urinalysis ([facility])    I personally performed the services described in this  documentation, which was scribed in my presence. The recorded information has been reviewed and considered, and addended by me as needed.   [name], M.D.  Primary Care at [facility] [address] [place], [place] [address] [phone] phone [phone] fax  04/24/17 12:10 AM

## 2017-04-23 ENCOUNTER — Other Ambulatory Visit: Payer: Self-pay

## 2017-04-24 ENCOUNTER — Encounter: Payer: Self-pay | Admitting: Family Medicine

## 2017-04-24 LAB — CBC WITH DIFFERENTIAL/PLATELET
BASOS ABS: 0 10*3/uL (ref 0.0–0.2)
BASOS: 0 %
EOS (ABSOLUTE): 0 10*3/uL (ref 0.0–0.4)
Eos: 0 %
Hematocrit: 39.4 % (ref 34.0–46.6)
Hemoglobin: 12.9 g/dL (ref 11.1–15.9)
IMMATURE GRANS (ABS): 0.1 10*3/uL (ref 0.0–0.1)
IMMATURE GRANULOCYTES: 1 %
LYMPHS: 8 %
Lymphocytes Absolute: 1.2 10*3/uL (ref 0.7–3.1)
MCH: 26.7 pg (ref 26.6–33.0)
MCHC: 32.7 g/dL (ref 31.5–35.7)
MCV: 81 fL (ref 79–97)
MONOCYTES: 3 %
Monocytes Absolute: 0.4 10*3/uL (ref 0.1–0.9)
NEUTROS ABS: 13 10*3/uL — AB (ref 1.4–7.0)
Neutrophils: 88 %
PLATELETS: 198 10*3/uL (ref 150–379)
RBC: 4.84 x10E6/uL (ref 3.77–5.28)
RDW: 16.1 % — ABNORMAL HIGH (ref 12.3–15.4)
WBC: 14.7 10*3/uL — ABNORMAL HIGH (ref 3.4–10.8)

## 2017-04-24 LAB — COMPREHENSIVE METABOLIC PANEL
ALBUMIN: 4.5 g/dL (ref 3.5–5.5)
ALK PHOS: 81 IU/L (ref 39–117)
ALT: 13 IU/L (ref 0–32)
AST: 16 IU/L (ref 0–40)
Albumin/Globulin Ratio: 1.7 (ref 1.2–2.2)
BILIRUBIN TOTAL: 0.2 mg/dL (ref 0.0–1.2)
BUN/Creatinine Ratio: 14 (ref 9–23)
BUN: 8 mg/dL (ref 6–24)
CHLORIDE: 101 mmol/L (ref 96–106)
CO2: 21 mmol/L (ref 20–29)
CREATININE: 0.58 mg/dL (ref 0.57–1.00)
Calcium: 9.1 mg/dL (ref 8.7–10.2)
GFR calc Af Amer: 131 mL/min/{1.73_m2} (ref >59)
GFR calc non Af Amer: 114 mL/min/{1.73_m2} (ref >59)
GLUCOSE: 101 mg/dL — AB (ref 65–99)
Globulin, Total: 2.7 g/dL (ref 1.5–4.5)
Potassium: 3.9 mmol/L (ref 3.5–5.2)
Sodium: 141 mmol/L (ref 134–144)
Total Protein: 7.2 g/dL (ref 6.0–8.5)

## 2017-04-24 LAB — VITAMIN D 25 HYDROXY (VIT D DEFICIENCY, FRACTURES): VIT D 25 HYDROXY: 15.2 ng/mL — AB (ref 30.0–100.0)

## 2017-04-24 LAB — SEDIMENTATION RATE: Sed Rate: 39 mm/hr — ABNORMAL HIGH (ref 0–32)

## 2017-04-24 LAB — C-REACTIVE PROTEIN: CRP: 35 mg/L — AB (ref 0.0–4.9)

## 2017-04-24 LAB — VITAMIN B12: VITAMIN B 12: 380 pg/mL (ref 232–1245)

## 2017-04-24 NOTE — Telephone Encounter (Signed)
Please follow-up on the Voltaren PA that has been requested and let pt know update. Thanks! Harmon Pier

## 2017-04-26 ENCOUNTER — Encounter: Payer: Self-pay | Admitting: Family Medicine

## 2017-04-26 MED ORDER — CEFDINIR 300 MG PO CAPS: 300.0000 mg | ORAL_CAPSULE | Freq: Two times a day (BID) | ORAL | 0 refills | Status: DC

## 2017-04-26 MED ORDER — ERGOCALCIFEROL 1.25 MG (50000 UT) PO CAPS: 50000.0000 [IU] | ORAL_CAPSULE | ORAL | 3 refills | Status: DC

## 2017-04-26 NOTE — Addendum Note (Signed)
Addended by: [place] Knapp on: 04/26/2017 07:24 PM   Modules accepted: Orders

## 2017-05-01 ENCOUNTER — Encounter: Payer: Self-pay | Admitting: Family Medicine

## 2017-05-03 ENCOUNTER — Telehealth: Payer: Self-pay | Admitting: Family Medicine

## 2017-05-03 DIAGNOSIS — G809 Cerebral palsy, unspecified: Secondary | ICD-10-CM | POA: Diagnosis not present

## 2017-05-03 DIAGNOSIS — H5032 Intermittent alternating esotropia: Secondary | ICD-10-CM | POA: Diagnosis not present

## 2017-05-03 DIAGNOSIS — Z9889 Other specified postprocedural states: Secondary | ICD-10-CM | POA: Diagnosis not present

## 2017-05-03 DIAGNOSIS — H4423 Degenerative myopia, bilateral: Secondary | ICD-10-CM | POA: Diagnosis not present

## 2017-05-03 NOTE — Telephone Encounter (Signed)
Copied from [place] [id number]. Topic: Quick Communication - See Telephone Encounter >> May 03, 2017 12:09 PM Aurelio Brash B wrote: CRM for notification. See Telephone encounter for:  Pt is requesting results of UA 05/03/17.

## 2017-05-03 NOTE — Telephone Encounter (Signed)
Pt also needing a PA for the diclofenac (VOLTAREN). Please forgive my mistake in the previous message. She is needing a refill of diflucan.

## 2017-05-03 NOTE — Telephone Encounter (Signed)
Copied from [place] [id number]. Topic: Quick Communication - See Telephone Encounter >> May 03, 2017 12:15 PM Aurelio Brash B wrote: CRM for notification. See Telephone encounter for:  Pt is requesting an rx for oral dislucan  be sent to [place], [place] [phone] (Phone)   [phone] (Fax)      And to follow up with the Prior Authorization 05/03/17.

## 2017-05-03 NOTE — Telephone Encounter (Signed)
Pt stated that her UA was dropped off on Monday Feburary 25th and she has not been called with results, nor are they in her chart or Mychart. She wants to know if this was lost and if she needs to repeat this? She is also needing a refill of diclofenac (VOLTAREN). Send to [facility] on [address]. Pt has also sent several mychart messages and wants to see if Dr. Brigitte Pulse can call her soon so that she can know what to do. Please advise.

## 2017-05-04 NOTE — Telephone Encounter (Signed)
Please advise regarding diflucan refill.

## 2017-05-05 NOTE — Telephone Encounter (Signed)
Pt calling about a refill on Diflucan and that a friend of hers will come by to pick up a urine cup and drop it off on Monday

## 2017-05-06 ENCOUNTER — Other Ambulatory Visit: Payer: Self-pay

## 2017-05-06 ENCOUNTER — Ambulatory Visit: Payer: Self-pay | Admitting: Family Medicine

## 2017-05-06 ENCOUNTER — Encounter: Payer: Self-pay | Admitting: Family Medicine

## 2017-05-06 MED ORDER — DICLOFENAC SODIUM 50 MG PO TBEC: 50.0000 mg | DELAYED_RELEASE_TABLET | Freq: Two times a day (BID) | ORAL | 1 refills | Status: DC

## 2017-05-06 MED ORDER — FLUCONAZOLE 150 MG PO TABS: 150.0000 mg | ORAL_TABLET | Freq: Once | ORAL | 1 refills | Status: AC

## 2017-05-06 NOTE — Addendum Note (Signed)
Addended by: Delle Reining on: 05/06/2017 04:22 PM   Modules accepted: Orders

## 2017-05-06 NOTE — Telephone Encounter (Addendum)
Incoming call from patient from after hours service. She is requesting diflucan 181m, NOT diclofenac be sent to [facility] [address].   Verbal order given for fluconazole 1576mtablet dispense #2 tablets, 1 refill. Sig is take 1 tablet, repeat in three days if symptoms persist.   [facility] with pharmacist GwMeredith ModyFluconazole called in for patient.   Phone call to patient. Advised fluconazole has been sent to her pharmacy, diclofenac cancelled. Patient appreciative. Patient requests an appointment next week to discuss antibiotics, transferred to front desk to schedule.

## 2017-05-08 ENCOUNTER — Encounter: Payer: Self-pay | Admitting: Family Medicine

## 2017-05-08 ENCOUNTER — Ambulatory Visit (INDEPENDENT_AMBULATORY_CARE_PROVIDER_SITE_OTHER): Payer: BLUE CROSS/BLUE SHIELD | Admitting: Family Medicine

## 2017-05-08 VITALS — BP 122/85 | HR 84 | Temp 99.5°F | Resp 18 | Ht 60.0 in

## 2017-05-08 DIAGNOSIS — R350 Frequency of micturition: Secondary | ICD-10-CM | POA: Diagnosis not present

## 2017-05-08 DIAGNOSIS — Z8739 Personal history of other diseases of the musculoskeletal system and connective tissue: Secondary | ICD-10-CM

## 2017-05-08 DIAGNOSIS — R399 Unspecified symptoms and signs involving the genitourinary system: Secondary | ICD-10-CM | POA: Diagnosis not present

## 2017-05-08 DIAGNOSIS — G894 Chronic pain syndrome: Secondary | ICD-10-CM | POA: Diagnosis not present

## 2017-05-08 DIAGNOSIS — J0141 Acute recurrent pansinusitis: Secondary | ICD-10-CM | POA: Diagnosis not present

## 2017-05-08 DIAGNOSIS — R42 Dizziness and giddiness: Secondary | ICD-10-CM | POA: Diagnosis not present

## 2017-05-08 MED ORDER — AZITHROMYCIN 250 MG PO TABS: ORAL_TABLET | ORAL | 0 refills | Status: DC

## 2017-05-08 MED ORDER — DIPHENOXYLATE-ATROPINE 2.5-0.025 MG PO TABS: ORAL_TABLET | ORAL | 1 refills | Status: DC

## 2017-05-08 MED ORDER — TRAMADOL HCL 50 MG PO TABS: 50.0000 mg | ORAL_TABLET | Freq: Four times a day (QID) | ORAL | 0 refills | Status: DC | PRN

## 2017-05-08 MED ORDER — PREDNISONE 5 MG PO TABS: 5.0000 mg | ORAL_TABLET | Freq: Every day | ORAL | 1 refills | Status: DC

## 2017-05-08 MED ORDER — PREDNISONE 20 MG PO TABS: ORAL_TABLET | ORAL | 0 refills | Status: DC

## 2017-05-08 MED ORDER — IBUPROFEN 800 MG PO TABS: 800.0000 mg | ORAL_TABLET | Freq: Three times a day (TID) | ORAL | 3 refills | Status: DC | PRN

## 2017-05-08 MED ORDER — FLUCONAZOLE 150 MG PO TABS: 150.0000 mg | ORAL_TABLET | Freq: Every day | ORAL | 0 refills | Status: DC

## 2017-05-08 NOTE — Progress Notes (Signed)
Subjective:    Patient ID: Carolyn Pena, female    DOB: Nov 07, 1975, 42 y.o.   MRN: [id number] Chief Complaint  Patient presents with  . Vaginal Itching    pt states itching, pain, and discharge has gotten better but with meds. Pt states symptoms are still going.   . Medication Management    pt states Cefdinir caused severe diarrhea. Pt states she stopped taking med and the diarrhea went away.  . Ear Pain    right ear pain with vertigo when changing positions.    HPI  Had been prednisone 5 for over a year and over the past year she has felt worse.  Did schedule for an allergist.   Has been taking the L-thiamine and has been helping sleep more. Using clonazepam for sleep but trazodone didn't help.   Did have strabismus consult and was told that she does not need surgery.  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 Allergy   . Anemia   . Anxiety   . Arthritis   . Asthma   . Chronic paraplegia ([place])   . CP (cerebral palsy) ([place])   . Depression   . H/O multiple concussions   . Neuromuscular disorder [facility])    Past Surgical History:  Procedure Laterality Date  . EYE SURGERY    . JOINT REPLACEMENT     Current Outpatient Medications on File Prior to Visit  Medication Sig Dispense Refill  . albuterol (PROVENTIL HFA;VENTOLIN HFA) 108 (90 Base) MCG/ACT inhaler Inhale 2 puffs into the lungs every 6 (six) hours as needed for wheezing or shortness of breath. 18 g 11  . albuterol (PROVENTIL) (2.5 MG/3ML) 0.083% nebulizer solution Take 3 mLs (2.5 mg total) by nebulization every 4 (four) hours as needed for wheezing or shortness of breath. 150 mL 5  . benzonatate (TESSALON) 200 MG capsule Take 1 capsule (200 mg total) by mouth 3 (three) times daily as needed for cough. 60 capsule 3  . busPIRone (BUSPAR) 5 MG tablet Take 1 tablet (5 mg total) by mouth 2 (two) times daily. 180 tablet 1  .  butalbital-acetaminophen-caffeine (FIORICET, ESGIC) 50-325-40 MG tablet Take 1 tablet by mouth every 4 (four) hours as needed for headache. 14 tablet 3  . clonazePAM (KLONOPIN) 1 MG tablet Take 1 tablet (1 mg total) by mouth 2 (two) times daily. 60 tablet 5  . diclofenac (VOLTAREN) 50 MG EC tablet Take 50 mg by mouth 4 (four) times daily.    . ergocalciferol (VITAMIN D2) 50000 units capsule Take 1 capsule (50,000 Units total) by mouth once a week. 12 capsule 3  . esomeprazole (NEXIUM) 40 MG capsule Take 1 capsule (40 mg total) by mouth daily at 12 noon. 30 capsule 11  . etonogestrel (NEXPLANON) 68 MG IMPL implant 1 each by Subdermal route once.    . Fluticasone-Salmeterol (ADVAIR) 100-50 MCG/DOSE AEPB Inhale 1 puff into the lungs 2 (two) times daily. 60 each 5  . furosemide (LASIX) 40 MG tablet Take 1 tablet (40 mg total) by mouth 2 (two) times daily. 1 tablet 0  . gabapentin (NEURONTIN) 300 MG capsule Take 2 capsules (600 mg total) by mouth 3 (three) times daily. 540 capsule 1  . HYDROmorphone (DILAUDID) 4 MG tablet Take 1 tablet (4 mg total) by mouth every 4 (four) hours as needed for severe pain. 40 tablet 0  . ketorolac (TORADOL) 30 MG/ML injection  Inject 1 mL (30 mg total) into the muscle once. 10 mL 2  . levocetirizine (XYZAL) 5 MG tablet Take 5 mg by mouth every evening.    . methocarbamol (ROBAXIN) 500 MG tablet Take 500 mg by mouth 3 (three) times daily.    . mupirocin ointment (BACTROBAN) 2 % Apply 1 application topically 2 (two) times daily. 30 g 1  . ondansetron (ZOFRAN-ODT) 4 MG disintegrating tablet Take 1-2 tablets (4-8 mg total) by mouth every 8 (eight) hours as needed for nausea or vomiting. 30 tablet 2  . polyethylene glycol powder (GLYCOLAX/MIRALAX) powder Take 17 g by mouth daily. 500 g 1  . potassium chloride SA (K-DUR,KLOR-CON) 20 MEQ tablet Take 1 tablet (20 mEq total) by mouth 2 (two) times daily. 1 tablet 0  . rizatriptan (MAXALT) 5 MG tablet Take 1 tablet (5 mg total) by  mouth as needed for migraine. 10 tablet 7  . sertraline (ZOLOFT) 100 MG tablet Take 2 tablets (200 mg total) by mouth daily. 1 tablet 0   No current facility-administered medications on file prior to visit.    Allergies  Allergen Reactions  . Cefdinir Diarrhea  . Codeine Nausea And Vomiting  . Lidocaine Swelling    Had steroid injection doesn't know if it was the steroid or lidocaine. "Whole arm swelled & warm to touch. Severe inflamation."   . Lortab [Hydrocodone-Acetaminophen] Nausea And Vomiting  . Augmentin [Amoxicillin-Pot Clavulanate] Nausea And Vomiting  . Methylprednisolone Rash    Had lidocaine as well   Family History  Problem Relation Age of Onset  . Cancer Mother        CLL, decsd in 2012  . Breast cancer Neg Hx    Social History   Socioeconomic History  . Marital status: Single    Spouse name: None  . Number of children: None  . Years of education: None  . Highest education level: None  Social Needs  . Financial resource strain: None  . Food insecurity - worry: None  . Food insecurity - inability: None  . Transportation needs - medical: None  . Transportation needs - non-medical: None  Occupational History  . None  Tobacco Use  . Smoking status: Never Smoker  . Smokeless tobacco: Never Used  Substance and Sexual Activity  . Alcohol use: None  . Drug use: No  . Sexual activity: None  Other Topics Concern  . None  Social History Narrative   Pt has doctorate degree   Depression screen [facility] 2/9 04/22/2017 04/08/2017 12/03/2016 06/21/2015 06/18/2015  Decreased Interest 0 0 0 0 0  Down, Depressed, Hopeless 0 0 0 0 0  PHQ - 2 Score 0 0 0 0 0     Review of Systems See hpi    Objective:   Physical Exam  Constitutional: She is oriented to person, place, and time. She appears well-developed and well-nourished. No distress.  In power wheelchair  HENT:  Head: Normocephalic and atraumatic.  Right Ear: Tympanic membrane, external ear and ear canal normal.    Left Ear: Tympanic membrane, external ear and ear canal normal.  Nose: Rhinorrhea present. No mucosal edema.  Mouth/Throat: Uvula is midline, oropharynx is clear and moist and mucous membranes are normal. No oropharyngeal exudate.  + clear PND  Eyes: Conjunctivae are normal. Right eye exhibits no discharge. Left eye exhibits no discharge. No scleral icterus.  Neck: Normal range of motion. Neck supple.  Cardiovascular: Normal rate, regular rhythm, normal heart sounds and intact distal pulses.  Pulmonary/Chest:  Effort normal and breath sounds normal.  Musculoskeletal: She exhibits edema, tenderness and deformity.  Lymphadenopathy:    She has no cervical adenopathy.  Neurological: She is alert and oriented to person, place, and time. She exhibits abnormal muscle tone.  Skin: Skin is warm. She is diaphoretic. No erythema.  Nexplanon in place palpable under subcutaneous tissue in right medial upper arm  Psychiatric: She has a normal mood and affect. Her behavior is normal.   BP 122/85 (BP Location: Left Arm, Patient Position: Sitting, Cuff Size: Large)   Pulse 84   Temp 99.5 F (37.5 C) (Oral)   Resp 18   Ht 5' (1.524 m)   SpO2 96%   BMI 27.34 kg/m       Assessment & Plan:  Stop the chronic prednisone 3 weeks before the allergist appointment.  1. Urinary frequency - standing orders for UA and clx placed and pt given urine cups to keep at home since can't give sample when in office due to wheelchair dependence  2. UTI symptoms   3. Acute recurrent pansinusitis - failed low dose prednisone burst alone and could not tolerate full course of omnicef due to diarrhea so will try zpack with higher dose prednisone  4. Vertigo - hoping c/w sinusitis. If does not improved, call in meclizine and consider referring for home health neuro PT for vestibular rehab vs ENT reeral  5. Chronic pain syndrome   6. Personal history of chronic inflammatory arthritis - ok to resume prednisone 4m qd after high  dose pred taper is complete as she felt distinctly better with less pain and more movement when on that.     Orders Placed This Encounter  Procedures  . Urinalysis, Routine w reflex microscopic    Standing Status:   Standing    Number of Occurrences:   12    Standing Expiration Date:   11/09/2018    Meds ordered this encounter  Medications  . fluconazole (DIFLUCAN) 150 MG tablet    Sig: Take 1 tablet (150 mg total) by mouth daily. Repeat if needed after 3d    Dispense:  7 tablet    Refill:  0  . azithromycin (ZITHROMAX) 250 MG tablet    Sig: Take 2 tabs PO x 1 dose, then 1 tab PO QD x 4 days    Dispense:  6 tablet    Refill:  0  . diphenoxylate-atropine (LOMOTIL) 2.5-0.025 MG tablet    Sig: TK 1 T PO QID PRF DIARRHEA.    Dispense:  120 tablet    Refill:  1  . predniSONE (DELTASONE) 20 MG tablet    Sig: Take 4 tabs po qd x1, then 3 tabs qd x 3d, then 2 tabs qd x 3d then 1 tab qd x 3d.    Dispense:  22 tablet    Refill:  0  . predniSONE (DELTASONE) 5 MG tablet    Sig: Take 1 tablet (5 mg total) by mouth daily with breakfast.    Dispense:  30 tablet    Refill:  1  . traMADol (ULTRAM) 50 MG tablet    Sig: Take 1 tablet (50 mg total) by mouth every 6 (six) hours as needed.    Dispense:  120 tablet    Refill:  0  . ibuprofen (ADVIL,MOTRIN) 800 MG tablet    Sig: Take 1 tablet (800 mg total) by mouth every 8 (eight) hours as needed for moderate pain.    Dispense:  30 tablet  Refill:  3    I personally performed the services described in this documentation, which was scribed in my presence. The recorded information has been reviewed and considered, and addended by me as needed.   [name], M.D.  Primary Care at [facility] [address] [place], [place] [address] [phone] phone [phone] fax  05/10/17 5:37 PM

## 2017-05-08 NOTE — Patient Instructions (Signed)
     IF you received an x-ray today, you will receive an invoice from [facility]. Please contact [facility] at [phone] with questions or concerns regarding your invoice.   IF you received labwork today, you will receive an invoice from [facility]. Please contact [facility] at [phone] with questions or concerns regarding your invoice.   Our billing staff will not be able to assist you with questions regarding bills from these companies.  You will be contacted with the lab results as soon as they are available. The fastest way to get your results is to activate your My Chart account. Instructions are located on the last page of this paperwork. If you have not heard from us regarding the results in 2 weeks, please contact this office.     

## 2017-05-09 ENCOUNTER — Ambulatory Visit: Payer: Self-pay | Admitting: Family Medicine

## 2017-05-11 DIAGNOSIS — H35411 Lattice degeneration of retina, right eye: Secondary | ICD-10-CM | POA: Diagnosis not present

## 2017-05-11 DIAGNOSIS — H33321 Round hole, right eye: Secondary | ICD-10-CM | POA: Diagnosis not present

## 2017-05-11 DIAGNOSIS — H35412 Lattice degeneration of retina, left eye: Secondary | ICD-10-CM | POA: Diagnosis not present

## 2017-05-11 DIAGNOSIS — H33322 Round hole, left eye: Secondary | ICD-10-CM | POA: Diagnosis not present

## 2017-05-13 ENCOUNTER — Inpatient Hospital Stay: Admission: RE | Admit: 2017-05-13 | Payer: Self-pay | Source: Ambulatory Visit

## 2017-05-20 ENCOUNTER — Other Ambulatory Visit: Payer: Self-pay

## 2017-05-23 ENCOUNTER — Other Ambulatory Visit: Payer: Self-pay | Admitting: Family Medicine

## 2017-05-23 DIAGNOSIS — M25572 Pain in left ankle and joints of left foot: Secondary | ICD-10-CM

## 2017-05-25 DIAGNOSIS — H33322 Round hole, left eye: Secondary | ICD-10-CM | POA: Diagnosis not present

## 2017-05-28 ENCOUNTER — Inpatient Hospital Stay: Admission: RE | Admit: 2017-05-28 | Payer: Self-pay | Source: Ambulatory Visit

## 2017-05-28 ENCOUNTER — Other Ambulatory Visit: Payer: Self-pay

## 2017-05-31 DIAGNOSIS — J4540 Moderate persistent asthma, uncomplicated: Secondary | ICD-10-CM | POA: Diagnosis not present

## 2017-05-31 DIAGNOSIS — T783XXA Angioneurotic edema, initial encounter: Secondary | ICD-10-CM | POA: Diagnosis not present

## 2017-05-31 DIAGNOSIS — J3089 Other allergic rhinitis: Secondary | ICD-10-CM | POA: Diagnosis not present

## 2017-05-31 DIAGNOSIS — L501 Idiopathic urticaria: Secondary | ICD-10-CM | POA: Diagnosis not present

## 2017-06-05 ENCOUNTER — Telehealth: Payer: Self-pay | Admitting: Family Medicine

## 2017-06-05 DIAGNOSIS — S99912D Unspecified injury of left ankle, subsequent encounter: Secondary | ICD-10-CM

## 2017-06-05 DIAGNOSIS — M25572 Pain in left ankle and joints of left foot: Secondary | ICD-10-CM

## 2017-06-05 DIAGNOSIS — M25472 Effusion, left ankle: Secondary | ICD-10-CM

## 2017-06-05 NOTE — Telephone Encounter (Signed)
Copied from [place] [phone]. Topic: Quick Communication - See Telephone Encounter >> Jun 05, 2017  2:14 PM Cleaster Corin, NT wrote: CRM for notification. See Telephone encounter for: 06/05/17.  Pt. Needs blood work done (requested by allergist) and would like it to have it done in office  pt. Would also like to let Dr. Brigitte Pulse know that she isn't able to have mri done at [facility] imaging and would like to have order transferred to [facility] pt. Callback number  [phone]

## 2017-06-05 NOTE — Telephone Encounter (Signed)
Copied from [place] [id number]. Topic: Quick Communication - See Telephone Encounter >> Jun 05, 2017  3:56 PM Oliver Pila B wrote: Pt called to ask if she can come in to do labs, pt states she has the lab orders herself to get these done; pt states her pcp would know what the situation is and what the pt is trying to accomplish, call pt to advise

## 2017-06-05 NOTE — Telephone Encounter (Signed)
Phone call to patient. She states she is going to send a MyChart message to provider with requested testing from [place], will be coming by tomorrow afternoon for blood draw.   She is hoping MRI can be re-ordered for [facility]. She states she has been to [facility] imaging three times and has been refused treatment because she cannot independently get on exam table.   MRI pended. Provider, [place]- incoming MyChart message.

## 2017-06-06 ENCOUNTER — Encounter: Payer: Self-pay | Admitting: Family Medicine

## 2017-06-06 DIAGNOSIS — T783XXA Angioneurotic edema, initial encounter: Secondary | ICD-10-CM

## 2017-06-06 NOTE — Telephone Encounter (Signed)
Provider, see scanned attachment for details. Labs pended.

## 2017-06-06 NOTE — Telephone Encounter (Signed)
Per 06/05/17 phone note from provider, labs signed with provider to co-sign.

## 2017-06-06 NOTE — Telephone Encounter (Addendum)
Thanks for making this happen Rudene Christians!! Absolutely fine to do. If they do need Epic order, fine to order under my name, will have diagnosis to asst on the allergist orders, and send to me for co-sig. I am not in office today but will be on Epic some. Ok to call my cell [phone] if any questions.    MRI re-order for [facility] signed - thanks for taking care of this too [place]!

## 2017-06-07 ENCOUNTER — Encounter: Payer: Self-pay | Admitting: Family Medicine

## 2017-06-07 DIAGNOSIS — R5382 Chronic fatigue, unspecified: Secondary | ICD-10-CM

## 2017-06-07 DIAGNOSIS — D649 Anemia, unspecified: Secondary | ICD-10-CM

## 2017-06-07 DIAGNOSIS — R7982 Elevated C-reactive protein (CRP): Secondary | ICD-10-CM

## 2017-06-08 ENCOUNTER — Other Ambulatory Visit: Payer: Self-pay | Admitting: Family Medicine

## 2017-06-08 ENCOUNTER — Other Ambulatory Visit: Payer: Self-pay

## 2017-06-08 NOTE — Telephone Encounter (Addendum)
Incoming call from patient. She is requesting a refill on gabapentin, states she has 5 tablets left. Medication previously filled by Dr. Tomi Likens.  Provider, please refill if appropriate.

## 2017-06-09 ENCOUNTER — Ambulatory Visit (INDEPENDENT_AMBULATORY_CARE_PROVIDER_SITE_OTHER): Payer: BLUE CROSS/BLUE SHIELD | Admitting: Family Medicine

## 2017-06-09 DIAGNOSIS — R5382 Chronic fatigue, unspecified: Secondary | ICD-10-CM

## 2017-06-09 DIAGNOSIS — R350 Frequency of micturition: Secondary | ICD-10-CM | POA: Diagnosis not present

## 2017-06-09 DIAGNOSIS — R399 Unspecified symptoms and signs involving the genitourinary system: Secondary | ICD-10-CM

## 2017-06-09 DIAGNOSIS — R7982 Elevated C-reactive protein (CRP): Secondary | ICD-10-CM

## 2017-06-09 DIAGNOSIS — D649 Anemia, unspecified: Secondary | ICD-10-CM | POA: Diagnosis not present

## 2017-06-09 DIAGNOSIS — T783XXA Angioneurotic edema, initial encounter: Secondary | ICD-10-CM

## 2017-06-09 NOTE — Progress Notes (Signed)
Lab Only Visit

## 2017-06-11 MED ORDER — GABAPENTIN 300 MG PO CAPS: 600.0000 mg | ORAL_CAPSULE | Freq: Three times a day (TID) | ORAL | 1 refills | Status: DC

## 2017-06-11 NOTE — Telephone Encounter (Signed)
That's fine - refill sent in.

## 2017-06-12 DIAGNOSIS — M3500 Sicca syndrome, unspecified: Secondary | ICD-10-CM | POA: Diagnosis not present

## 2017-06-12 DIAGNOSIS — R7982 Elevated C-reactive protein (CRP): Secondary | ICD-10-CM | POA: Diagnosis not present

## 2017-06-12 DIAGNOSIS — M2550 Pain in unspecified joint: Secondary | ICD-10-CM | POA: Diagnosis not present

## 2017-06-12 DIAGNOSIS — M791 Myalgia, unspecified site: Secondary | ICD-10-CM | POA: Diagnosis not present

## 2017-06-14 DIAGNOSIS — H3122 Choroidal dystrophy (central areolar) (generalized) (peripapillary): Secondary | ICD-10-CM | POA: Diagnosis not present

## 2017-06-14 DIAGNOSIS — H35411 Lattice degeneration of retina, right eye: Secondary | ICD-10-CM | POA: Diagnosis not present

## 2017-06-14 DIAGNOSIS — H4423 Degenerative myopia, bilateral: Secondary | ICD-10-CM | POA: Diagnosis not present

## 2017-06-14 DIAGNOSIS — H33321 Round hole, right eye: Secondary | ICD-10-CM | POA: Diagnosis not present

## 2017-06-14 DIAGNOSIS — H35412 Lattice degeneration of retina, left eye: Secondary | ICD-10-CM | POA: Diagnosis not present

## 2017-06-15 ENCOUNTER — Encounter (INDEPENDENT_AMBULATORY_CARE_PROVIDER_SITE_OTHER): Payer: Self-pay

## 2017-06-15 DIAGNOSIS — R7982 Elevated C-reactive protein (CRP): Secondary | ICD-10-CM | POA: Insufficient documentation

## 2017-06-19 LAB — C1 ESTERASE INHIBITOR, FUNCTIONAL: C1INH Functional/C1INH Total MFr SerPl: 83 %mean normal

## 2017-06-19 LAB — CBC WITH DIFFERENTIAL/PLATELET
Basophils Absolute: 0 10*3/uL (ref 0.0–0.2)
Basos: 0 %
EOS (ABSOLUTE): 0.2 10*3/uL (ref 0.0–0.4)
EOS: 2 %
HEMOGLOBIN: 12.5 g/dL (ref 11.1–15.9)
Hematocrit: 38.8 % (ref 34.0–46.6)
IMMATURE GRANS (ABS): 0 10*3/uL (ref 0.0–0.1)
Immature Granulocytes: 0 %
LYMPHS: 18 %
Lymphocytes Absolute: 1.8 10*3/uL (ref 0.7–3.1)
MCH: 26.1 pg — ABNORMAL LOW (ref 26.6–33.0)
MCHC: 32.2 g/dL (ref 31.5–35.7)
MCV: 81 fL (ref 79–97)
MONOCYTES: 4 %
Monocytes Absolute: 0.4 10*3/uL (ref 0.1–0.9)
NEUTROS ABS: 7.4 10*3/uL — AB (ref 1.4–7.0)
Neutrophils: 76 %
Platelets: 211 10*3/uL (ref 150–379)
RBC: 4.79 x10E6/uL (ref 3.77–5.28)
RDW: 15.3 % (ref 12.3–15.4)
WBC: 9.8 10*3/uL (ref 3.4–10.8)

## 2017-06-19 LAB — C2 COMPLEMENT: C2 COMPLEMENT: 1.5 mg/dL — AB (ref 1.6–4.0)

## 2017-06-19 LAB — LATEX, IGE: Latex IgE: <0.1 kU/L

## 2017-06-19 LAB — C1 ESTERASE INHIBITOR: C1 ESTERASE INH: 33 mg/dL (ref 21–39)

## 2017-06-20 ENCOUNTER — Telehealth: Payer: Self-pay

## 2017-06-20 NOTE — Telephone Encounter (Signed)
Copied from [place] [id number]. Topic: Quick Communication - Lab Results >> Jun 20, 2017  1:25 PM Scherrie Gerlach wrote: Pt would like results of labs done 4/12

## 2017-06-23 NOTE — Progress Notes (Signed)
Spoke with pt for pre-op call and she states she is having to cancel the surgery because she has been sick for 36 hours. She states she spoke with Dr. Posey Pronto and he is aware of her being sick. I called Dr. Serita Grit office and he answered the phone himself and he stated that she needed to be cancelled and asked if I would call the OR. I called and spoke with April in the [place].

## 2017-06-26 ENCOUNTER — Ambulatory Visit (HOSPITAL_COMMUNITY): Admission: RE | Admit: 2017-06-26 | Payer: BLUE CROSS/BLUE SHIELD | Source: Ambulatory Visit | Admitting: Ophthalmology

## 2017-06-26 ENCOUNTER — Encounter (HOSPITAL_COMMUNITY): Admission: RE | Payer: Self-pay | Source: Ambulatory Visit

## 2017-06-26 SURGERY — PHOTOCOAGULATION, EYE, USING LASER
Anesthesia: Monitor Anesthesia Care | Laterality: Bilateral

## 2017-06-27 ENCOUNTER — Encounter: Payer: Self-pay | Admitting: Family Medicine

## 2017-06-27 NOTE — Telephone Encounter (Signed)
I read the letter and am fine with all of this, would you please copy the letter into an Epic letter so it is in the letter section of her chart and prints out on our letterhead with my name at [place], please print out a copy of the letter and put in my box for sig so we can mail a hard copy to pt. Also, new wound care referral is fine to place under my name and whatever needs to be done to get her the rx for the lift is fine as well. THANK YOU SO MUCH FOR YOUR HELP!!! Harmon Pier

## 2017-06-28 ENCOUNTER — Encounter (INDEPENDENT_AMBULATORY_CARE_PROVIDER_SITE_OTHER): Payer: Self-pay

## 2017-06-28 NOTE — Telephone Encounter (Signed)
You are AMAZING!!!! Carolyn Pena

## 2017-06-28 NOTE — Telephone Encounter (Signed)
Letter generated and placed in Dr. Raul Del box for signature. Noted patient is requesting wound care & evaluation summary on buttocks from [facility].   Phone call to [facility], spoke with Otila Kluver. She states all that is needed is a verbal for wound care and evaluation faxed to the office at [phone]. Provider request for wound care printed and faxed to [facility].  Will await patient's request for [facility] DME for further details from patient.

## 2017-06-28 NOTE — Progress Notes (Signed)
Sent via fax 06/28/17

## 2017-06-30 ENCOUNTER — Encounter (HOSPITAL_COMMUNITY): Payer: Self-pay | Admitting: *Deleted

## 2017-06-30 ENCOUNTER — Other Ambulatory Visit: Payer: Self-pay

## 2017-07-02 NOTE — Anesthesia Preprocedure Evaluation (Signed)
Anesthesia Evaluation  Patient identified by MRN, date of birth, ID band Patient awake    Reviewed: Allergy & Precautions, NPO status , Patient's Chart, lab work & pertinent test results  History of Anesthesia Complications (+) PONV  Airway Mallampati: II  TM Distance: >3 FB Neck ROM: Full    Dental   Pulmonary asthma ,    breath sounds clear to auscultation       Cardiovascular negative cardio ROS   Rhythm:Regular Rate:Normal     Neuro/Psych  Headaches, Anxiety Depression PTSDCerebral palsy; paraplegic, hx multiple concussions  Neuromuscular disease    GI/Hepatic Neg liver ROS, GERD  ,  Endo/Other  negative endocrine ROS  Renal/GU negative Renal ROS     Musculoskeletal  (+) Arthritis ,   Abdominal   Peds  Hematology  (+) anemia ,   Anesthesia Other Findings   Reproductive/Obstetrics                             Anesthesia Physical Anesthesia Plan  ASA: III  Anesthesia Plan: MAC   Post-op Pain Management:    Induction: Intravenous  PONV Risk Score and Plan: 3 and Propofol infusion, Treatment may vary due to age or medical condition, Ondansetron and Midazolam  Airway Management Planned: Nasal Cannula and Natural Airway  Additional Equipment: None  Intra-op Plan:   Post-operative Plan:   Informed Consent: I have reviewed the patients History and Physical, chart, labs and discussed the procedure including the risks, benefits and alternatives for the proposed anesthesia with the patient or authorized representative who has indicated his/her understanding and acceptance.     Plan Discussed with: CRNA and Anesthesiologist  Anesthesia Plan Comments:         Anesthesia Quick Evaluation

## 2017-07-03 ENCOUNTER — Ambulatory Visit (HOSPITAL_COMMUNITY)
Admission: RE | Admit: 2017-07-03 | Discharge: 2017-07-03 | Disposition: A | Discharge status: Home / Self Care | Payer: BLUE CROSS/BLUE SHIELD | Source: Ambulatory Visit | Attending: Ophthalmology | Admitting: Ophthalmology

## 2017-07-03 ENCOUNTER — Encounter (HOSPITAL_COMMUNITY)
Admission: RE | Disposition: A | Discharge status: Home / Self Care | Payer: Self-pay | Source: Ambulatory Visit | Attending: Ophthalmology

## 2017-07-03 ENCOUNTER — Ambulatory Visit (HOSPITAL_COMMUNITY): Payer: BLUE CROSS/BLUE SHIELD | Admitting: Anesthesiology

## 2017-07-03 DIAGNOSIS — H33323 Round hole, bilateral: Secondary | ICD-10-CM | POA: Diagnosis not present

## 2017-07-03 DIAGNOSIS — H33321 Round hole, right eye: Secondary | ICD-10-CM | POA: Diagnosis not present

## 2017-07-03 DIAGNOSIS — G808 Other cerebral palsy: Secondary | ICD-10-CM | POA: Insufficient documentation

## 2017-07-03 DIAGNOSIS — H35413 Lattice degeneration of retina, bilateral: Secondary | ICD-10-CM | POA: Diagnosis not present

## 2017-07-03 DIAGNOSIS — D649 Anemia, unspecified: Secondary | ICD-10-CM | POA: Diagnosis not present

## 2017-07-03 DIAGNOSIS — G43709 Chronic migraine without aura, not intractable, without status migrainosus: Secondary | ICD-10-CM | POA: Diagnosis not present

## 2017-07-03 DIAGNOSIS — M549 Dorsalgia, unspecified: Secondary | ICD-10-CM | POA: Diagnosis not present

## 2017-07-03 DIAGNOSIS — H35412 Lattice degeneration of retina, left eye: Secondary | ICD-10-CM | POA: Diagnosis not present

## 2017-07-03 DIAGNOSIS — H35411 Lattice degeneration of retina, right eye: Secondary | ICD-10-CM | POA: Diagnosis not present

## 2017-07-03 HISTORY — DX: Other complications of anesthesia, initial encounter: T88.59XA

## 2017-07-03 HISTORY — DX: Other specified postprocedural states: Z98.890

## 2017-07-03 HISTORY — DX: Post-traumatic stress disorder, unspecified: F43.10

## 2017-07-03 HISTORY — DX: Gastro-esophageal reflux disease without esophagitis: K21.9

## 2017-07-03 HISTORY — DX: Nausea with vomiting, unspecified: R11.2

## 2017-07-03 HISTORY — PX: PHOTOCOAGULATION WITH LASER: SHX6027

## 2017-07-03 HISTORY — DX: Urinary tract infection, site not specified: N39.0

## 2017-07-03 HISTORY — DX: Adverse effect of unspecified anesthetic, initial encounter: T41.45XA

## 2017-07-03 HISTORY — DX: Family history of other specified conditions: Z84.89

## 2017-07-03 HISTORY — DX: Headache, unspecified: R51.9

## 2017-07-03 HISTORY — DX: Headache: R51

## 2017-07-03 LAB — CBC
HCT: 38.8 % (ref 36.0–46.0)
HEMOGLOBIN: 11.8 g/dL — AB (ref 12.0–15.0)
MCH: 25.3 pg — ABNORMAL LOW (ref 26.0–34.0)
MCHC: 30.4 g/dL (ref 30.0–36.0)
MCV: 83.3 fL (ref 78.0–100.0)
PLATELETS: 225 10*3/uL (ref 150–400)
RBC: 4.66 MIL/uL (ref 3.87–5.11)
RDW: 15 % (ref 11.5–15.5)
WBC: 11.6 10*3/uL — ABNORMAL HIGH (ref 4.0–10.5)

## 2017-07-03 LAB — HCG, SERUM, QUALITATIVE: PREG SERUM: NEGATIVE

## 2017-07-03 SURGERY — PHOTOCOAGULATION, EYE, USING LASER
Anesthesia: Monitor Anesthesia Care | Site: Eye | Laterality: Bilateral

## 2017-07-03 MED ORDER — DEXAMETHASONE SODIUM PHOSPHATE 10 MG/ML IJ SOLN
INTRAMUSCULAR | Status: AC
  Filled 2017-07-03: qty 1

## 2017-07-03 MED ORDER — LACTATED RINGERS IV SOLN
INTRAVENOUS | Status: DC | PRN
  Administered 2017-07-03: 07:00:00 via INTRAVENOUS

## 2017-07-03 MED ORDER — TOBRAMYCIN 0.3 % OP OINT
TOPICAL_OINTMENT | OPHTHALMIC | Status: DC | PRN
  Administered 2017-07-03: 1 via OPHTHALMIC

## 2017-07-03 MED ORDER — ONDANSETRON HCL 4 MG/2ML IJ SOLN: 4.0000 mg | Freq: Once | INTRAMUSCULAR | Status: DC | PRN

## 2017-07-03 MED ORDER — PHENYLEPHRINE HCL 2.5 % OP SOLN
1.0000 [drp] | OPHTHALMIC | Status: AC | PRN
  Administered 2017-07-03 (×3): 1 [drp] via OPHTHALMIC
  Filled 2017-07-03: qty 2

## 2017-07-03 MED ORDER — HYPROMELLOSE (GONIOSCOPIC) 2.5 % OP SOLN
OPHTHALMIC | Status: AC
  Filled 2017-07-03: qty 15

## 2017-07-03 MED ORDER — BSS IO SOLN
INTRAOCULAR | Status: DC | PRN
  Administered 2017-07-03 (×2): 15 mL

## 2017-07-03 MED ORDER — MIDAZOLAM HCL 2 MG/2ML IJ SOLN
INTRAMUSCULAR | Status: AC
  Filled 2017-07-03: qty 2

## 2017-07-03 MED ORDER — MIDAZOLAM HCL 2 MG/2ML IJ SOLN
INTRAMUSCULAR | Status: DC | PRN
  Administered 2017-07-03: 2 mg via INTRAVENOUS
  Administered 2017-07-03 (×2): 1 mg via INTRAVENOUS

## 2017-07-03 MED ORDER — TROPICAMIDE 1 % OP SOLN
1.0000 [drp] | OPHTHALMIC | Status: AC | PRN
  Administered 2017-07-03 (×3): 1 [drp] via OPHTHALMIC
  Filled 2017-07-03: qty 15

## 2017-07-03 MED ORDER — FENTANYL CITRATE (PF) 100 MCG/2ML IJ SOLN
INTRAMUSCULAR | Status: AC
  Filled 2017-07-03: qty 2

## 2017-07-03 MED ORDER — TOBRAMYCIN-DEXAMETHASONE 0.3-0.1 % OP OINT
TOPICAL_OINTMENT | OPHTHALMIC | Status: AC
  Filled 2017-07-03: qty 3.5

## 2017-07-03 MED ORDER — FENTANYL CITRATE (PF) 100 MCG/2ML IJ SOLN
25.0000 ug | INTRAMUSCULAR | Status: DC | PRN
  Administered 2017-07-03 (×2): 50 ug via INTRAVENOUS

## 2017-07-03 MED ORDER — PROPARACAINE HCL 0.5 % OP SOLN: 1.0000 [drp] | Freq: Once | OPHTHALMIC | Status: DC

## 2017-07-03 MED ORDER — PHENYLEPHRINE HCL 10 MG/ML IJ SOLN
INTRAVENOUS | Status: DC | PRN
  Administered 2017-07-03: 25 ug/min via INTRAVENOUS

## 2017-07-03 MED ORDER — FENTANYL CITRATE (PF) 250 MCG/5ML IJ SOLN
INTRAMUSCULAR | Status: AC
  Filled 2017-07-03: qty 5

## 2017-07-03 MED ORDER — SCOPOLAMINE 1 MG/3DAYS TD PT72
MEDICATED_PATCH | TRANSDERMAL | Status: DC | PRN
  Administered 2017-07-03: 1 via TRANSDERMAL

## 2017-07-03 MED ORDER — TETRACAINE HCL 0.5 % OP SOLN
OPHTHALMIC | Status: AC
  Filled 2017-07-03: qty 4

## 2017-07-03 MED ORDER — BSS IO SOLN
INTRAOCULAR | Status: AC
  Filled 2017-07-03: qty 30

## 2017-07-03 MED ORDER — BSS IO SOLN
INTRAOCULAR | Status: AC
  Filled 2017-07-03: qty 15

## 2017-07-03 MED ORDER — PROPOFOL 500 MG/50ML IV EMUL
INTRAVENOUS | Status: DC | PRN
  Administered 2017-07-03: 100 ug/kg/min via INTRAVENOUS

## 2017-07-03 MED ORDER — CYCLOPENTOLATE HCL 1 % OP SOLN
1.0000 [drp] | OPHTHALMIC | Status: AC | PRN
  Administered 2017-07-03 (×3): 1 [drp] via OPHTHALMIC
  Filled 2017-07-03: qty 2

## 2017-07-03 MED ORDER — ALBUTEROL SULFATE (2.5 MG/3ML) 0.083% IN NEBU
INHALATION_SOLUTION | RESPIRATORY_TRACT | Status: AC
  Filled 2017-07-03: qty 3

## 2017-07-03 MED ORDER — DEXMEDETOMIDINE HCL 200 MCG/2ML IV SOLN
INTRAVENOUS | Status: DC | PRN
  Administered 2017-07-03 (×6): 4 ug via INTRAVENOUS

## 2017-07-03 MED ORDER — PHENYLEPHRINE 40 MCG/ML (10ML) SYRINGE FOR IV PUSH (FOR BLOOD PRESSURE SUPPORT)
PREFILLED_SYRINGE | INTRAVENOUS | Status: DC | PRN
  Administered 2017-07-03 (×5): 80 ug via INTRAVENOUS

## 2017-07-03 MED ORDER — PROPARACAINE HCL 0.5 % OP SOLN
1.0000 [drp] | Freq: Two times a day (BID) | OPHTHALMIC | Status: AC | PRN
  Administered 2017-07-03 (×2): 1 [drp] via OPHTHALMIC

## 2017-07-03 MED ORDER — SCOPOLAMINE 1 MG/3DAYS TD PT72: 1.0000 | MEDICATED_PATCH | TRANSDERMAL | Status: DC

## 2017-07-03 MED ORDER — PROPOFOL 10 MG/ML IV BOLUS
INTRAVENOUS | Status: DC | PRN
  Administered 2017-07-03: 20 mg via INTRAVENOUS
  Administered 2017-07-03: 30 mg via INTRAVENOUS

## 2017-07-03 MED ORDER — HYPROMELLOSE (GONIOSCOPIC) 2.5 % OP SOLN
OPHTHALMIC | Status: DC | PRN
  Administered 2017-07-03: 1 [drp] via OPHTHALMIC

## 2017-07-03 MED ORDER — PROPARACAINE HCL 0.5 % OP SOLN
1.0000 [drp] | OPHTHALMIC | Status: AC | PRN
  Administered 2017-07-03: 1 [drp] via OPHTHALMIC
  Filled 2017-07-03: qty 15

## 2017-07-03 MED ORDER — FENTANYL CITRATE (PF) 250 MCG/5ML IJ SOLN
INTRAMUSCULAR | Status: DC | PRN
  Administered 2017-07-03 (×3): 50 ug via INTRAVENOUS

## 2017-07-03 MED ORDER — SCOPOLAMINE 1 MG/3DAYS TD PT72
MEDICATED_PATCH | TRANSDERMAL | Status: AC
  Filled 2017-07-03: qty 1

## 2017-07-03 MED ORDER — ALBUTEROL SULFATE (2.5 MG/3ML) 0.083% IN NEBU
2.5000 mg | INHALATION_SOLUTION | Freq: Once | RESPIRATORY_TRACT | Status: AC
  Administered 2017-07-03: 2.5 mg via RESPIRATORY_TRACT

## 2017-07-03 MED ORDER — TETRACAINE 0.5 % OP SOLN OPTIME - NO CHARGE
OPHTHALMIC | Status: DC | PRN
  Administered 2017-07-03: 2 [drp] via OPHTHALMIC

## 2017-07-03 MED ORDER — ONDANSETRON HCL 4 MG/2ML IJ SOLN
INTRAMUSCULAR | Status: DC | PRN
  Administered 2017-07-03: 4 mg via INTRAVENOUS

## 2017-07-03 SURGICAL SUPPLY — 12 items
APPLICATOR COTTON TIP 6IN STRL (MISCELLANEOUS) ×2 IMPLANT
BANDAGE EYE OVAL (MISCELLANEOUS) IMPLANT
GLOVE ECLIPSE 7.5 STRL STRAW (GLOVE) ×2 IMPLANT
GOWN STRL REUS W/ TWL LRG LVL3 (GOWN DISPOSABLE) ×2 IMPLANT
GOWN STRL REUS W/TWL LRG LVL3 (GOWN DISPOSABLE) ×2
KIT BASIN OR (CUSTOM PROCEDURE TRAY) ×2 IMPLANT
NS IRRIG 1000ML POUR BTL (IV SOLUTION) ×2 IMPLANT
PACK VITRECTOMY CUSTOM (CUSTOM PROCEDURE TRAY) ×2 IMPLANT
PROBE LASER ILLUM FLEX CVD 25G (OPHTHALMIC) IMPLANT
SHEET MEDIUM DRAPE 40X70 STRL (DRAPES) ×2 IMPLANT
WATER STERILE IRR 1000ML POUR (IV SOLUTION) ×2 IMPLANT
WIPE INSTRUMENT VISIWIPE 73X73 (MISCELLANEOUS) ×2 IMPLANT

## 2017-07-03 NOTE — H&P (Signed)
  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 Allergy   . Anemia   . Anxiety   . Arthritis   . Asthma   . Chronic paraplegia ([place])   . Complication of anesthesia   . CP (cerebral palsy) ([place])   . Depression   . Family history of adverse reaction to anesthesia    sister- severe n/v  . GERD (gastroesophageal reflux disease)   . H/O multiple concussions   . Headache   . Neuromuscular disorder ([place])    CP  . PONV (postoperative nausea and vomiting)    Severe  . PTSD (post-traumatic stress disorder)   . 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 Cancer Mother        CLL, decsd in 2012  . Cerebral palsy Sister   . Breast cancer Neg Hx     General:  Healthy appearing patient in no distress.    Eyes:    Acuity OD 20/40  OS 20/40   External: Within normal limits     Anterior segment: Within normal limits     Fundus: staphyloma's OU, lattice degernation with holes OU.     Impression: Lattice degeneration with holes OU  Plan: Laser demarcation OU  Royston Cowper

## 2017-07-03 NOTE — Anesthesia Postprocedure Evaluation (Signed)
Anesthesia Post Note  Patient: Carolyn Pena  Procedure(s) Performed: LASER DEMARCATION BILATERAL EYES (Bilateral Eye)     Patient location during evaluation: PACU Anesthesia Type: MAC Level of consciousness: awake and alert Pain management: pain level controlled Vital Signs Assessment: post-procedure vital signs reviewed and stable Respiratory status: spontaneous breathing, nonlabored ventilation and respiratory function stable Cardiovascular status: stable and blood pressure returned to baseline Anesthetic complications: no    Last Vitals:  Vitals:   07/03/17 0952 07/03/17 1003  BP: 107/66 114/76  Pulse:  76  Resp:  13  Temp:    SpO2:  97%    Last Pain:  Vitals:   07/03/17 1003  TempSrc:   PainSc: [name]

## 2017-07-03 NOTE — Progress Notes (Signed)
Manual correction to AVS where meds to stop taking are listed. This meds are to be continued

## 2017-07-03 NOTE — Progress Notes (Signed)
Patient was cleaned and diaper placed by NT.  Patient had small areas to buttocks and inner thigh of reddened skin, stage 1.

## 2017-07-03 NOTE — Progress Notes (Signed)
Has slept x 45 mins uninterrupted. Awakened for readiness to transport/coughing paroxysm and large amount belching as though swallowed much air. Some tighness per pt " like asthma attack" and faint insp wheeze bilat. Orders frop Dr Fransisco Beau for albuterol neb

## 2017-07-03 NOTE — Discharge Instructions (Signed)
USE TOBRADEX/MAXITROL OINTMENT FOR THE NEXT FEW DAYS FOR EYE IRRITATION.  DO NOT CONTINUE AFTER 3-4 DAYS

## 2017-07-03 NOTE — Brief Op Note (Signed)
07/03/2017  9:01 AM  PATIENT:  Carolyn Pena  42 y.o. female  PRE-OPERATIVE DIAGNOSIS:  LATTICE DEGENERATION WITH HOLES BOTH EYES  POST-OPERATIVE DIAGNOSIS:   LATTICE DEGENERATION WITH HOLES BOTH EYES  PROCEDURE:  Procedure(s): LASER DEMARCATION BILATERAL EYES (Bilateral)  SURGEON:  Surgeon(s) and Role:    * Jalene Mullet, MD - Primary  PHYSICIAN ASSISTANT:   ASSISTANTS: none   ANESTHESIA:   local and MAC  EBL:  NONE   BLOOD ADMINISTERED:none  DRAINS: none   LOCAL MEDICATIONS USED:  TOPICAL TETRACAINE  SPECIMEN:  No Specimen  DISPOSITION OF SPECIMEN:  N/A  COUNTS:  YES  TOURNIQUET:  * No tourniquets in log *  DICTATION: .Note written in EPIC  PLAN OF CARE: Discharge to home after PACU  PATIENT DISPOSITION:  PACU - hemodynamically stable.   Delay start of Pharmacological VTE agent (>24hrs) due to surgical blood loss or risk of bleeding: not applicable

## 2017-07-03 NOTE — Transfer of Care (Signed)
Immediate Anesthesia Transfer of Care Note  Patient: Carolyn Pena  Procedure(s) Performed: LASER DEMARCATION BILATERAL EYES (Bilateral Eye)  Patient Location: PACU  Anesthesia Type:MAC  Level of Consciousness: awake and alert   Airway & Oxygen Therapy: Patient Spontanous Breathing and Patient connected to nasal cannula oxygen  Post-op Assessment: Report given to RN and Post -op Vital signs reviewed and stable  Post vital signs: Reviewed and stable  Last Vitals:  Vitals Value Taken Time  BP 87/51 07/03/2017  9:06 AM  Temp    Pulse 109 07/03/2017  9:06 AM  Resp 19 07/03/2017  9:06 AM  SpO2 99 % 07/03/2017  9:06 AM  Vitals shown include unvalidated device data.  Last Pain:  Vitals:   07/03/17 0727  TempSrc:   PainSc: 0-No pain      Patients Stated Pain Goal: 2 (67/59/16 3846)  Complications: No apparent anesthesia complications

## 2017-07-04 ENCOUNTER — Encounter (HOSPITAL_COMMUNITY): Payer: Self-pay | Admitting: Ophthalmology

## 2017-07-05 ENCOUNTER — Encounter (HOSPITAL_COMMUNITY): Payer: Self-pay

## 2017-07-05 ENCOUNTER — Ambulatory Visit (HOSPITAL_COMMUNITY): Payer: BLUE CROSS/BLUE SHIELD

## 2017-07-11 NOTE — Op Note (Signed)
[facility] 07/11/2017 Diagnosis: Lattice degeneration OU and retinal holes OU  Procedure: laser demarcation OU Operative Eye:  both eyes  Surgeon: Royston Cowper Estimated Blood Loss: minimal Specimens for Pathology:  None Complications: none  General anesthesia was attained without complication. The  patient was prepped in the usual fashion for ocular surgery on the  both eyes .  A lid speculum was placed.  A limited exam under anesthesia was performed with delineate the location of the lattice degeneration and retinal holes in each eye.  After identifying the location of the lattice degeneration and holes, laser demacation was applied via indirect laser.    3 rows of endolaser were applied to the periphery to surround the holes and lattice degneration.  The speculum was removed and the eye was patched with Polymixin/Bacitracin ophthalmic ointment.  The patient tolerated the procedure well and no complications were noted.  Royston Cowper MD

## 2017-07-20 ENCOUNTER — Encounter: Payer: Self-pay | Admitting: Family Medicine

## 2017-07-25 ENCOUNTER — Encounter: Payer: Self-pay | Admitting: Family Medicine

## 2017-07-25 ENCOUNTER — Other Ambulatory Visit: Payer: Self-pay | Admitting: Family Medicine

## 2017-07-25 NOTE — Telephone Encounter (Signed)
Patient is requesting a refill of the following medications: Requested Prescriptions   Pending Prescriptions Disp Refills  . butalbital-acetaminophen-caffeine (FIORICET, ESGIC) 50-325-40 MG tablet 14 tablet 3    Sig: Take 1 tablet by mouth every 4 (four) hours as needed for headache.    Date of patient request: 07/25/17 Last office visit: 04/08/17 Date of last refill: 04/08/17 Last refill amount: #14, three refills Follow up time period per chart: Per 04/08/17 office visit note, "Try the fioricet for the headache.  Try the promethazine-DM syrup instead of the nyquil.  I suspect you have a severe viral infection - may have even been the flu.  Try using your Advair too to ensure your lungs and upper airway stay open after you finish the 5d oral prednisone course." Of note, patient has a history of migraines.

## 2017-07-28 MED ORDER — BUTALBITAL-APAP-CAFFEINE 50-325-40 MG PO TABS: 1.0000 | ORAL_TABLET | ORAL | 3 refills | Status: DC | PRN

## 2017-07-28 NOTE — Telephone Encounter (Signed)
Looks like was d/c'd by ophtho when pt had same day laser ophtho surgery for lattice degeneration - they don't mention any particular reason why they d/c'd it and no ophtho contraindications/cautions/adverse effects listed in epocrates so will refill

## 2017-08-03 DIAGNOSIS — R300 Dysuria: Secondary | ICD-10-CM | POA: Diagnosis not present

## 2017-08-04 ENCOUNTER — Ambulatory Visit: Payer: Self-pay | Admitting: *Deleted

## 2017-08-04 DIAGNOSIS — H9319 Tinnitus, unspecified ear: Secondary | ICD-10-CM | POA: Insufficient documentation

## 2017-08-04 DIAGNOSIS — R112 Nausea with vomiting, unspecified: Secondary | ICD-10-CM | POA: Insufficient documentation

## 2017-08-04 DIAGNOSIS — R61 Generalized hyperhidrosis: Secondary | ICD-10-CM | POA: Insufficient documentation

## 2017-08-04 MED ORDER — CEPHALEXIN 500 MG PO CAPS: 500.0000 mg | ORAL_CAPSULE | Freq: Two times a day (BID) | ORAL | 0 refills | Status: DC

## 2017-08-04 NOTE — Telephone Encounter (Signed)
Pt was seen at an urgent care last night for a kidney infection and was prescribed Macrobid. She states she always take rocephin for this, because it always works for her. She would like her pcp to give her a call back regarding this medication. She had several tests and blood work done at the urgent care last night. Flow at [facility] notified and will have the provider to give her a call back. Pt advised to call back for any concerns, pt voiced understanding.  Reason for Disposition . [1] Request for URGENT new prescription or refill of "essential" medication (i.e., likelihood of harm to patient if not taken) AND [2] triager unable to fill per unit policy  Answer Assessment - Initial Assessment Questions 1. SYMPTOMS: "Do you have any symptoms?"     Pt diagnosed with kidney infection and prescribed microbid. She her pcp to give her a call.  Protocols used: MEDICATION QUESTION CALL-A-AH

## 2017-08-04 NOTE — Telephone Encounter (Signed)
Can someone please put a copy of a  DMV Handicapped form in my box with [place]'s demographic info completed? Thanks. Harmon Pier

## 2017-08-04 NOTE — Addendum Note (Signed)
Addended by: Shawnee Knapp on: 08/04/2017 02:01 PM   Modules accepted: Orders

## 2017-08-04 NOTE — Telephone Encounter (Addendum)
Rocephin is an IV/IM medication that she has to be seen in the office for. However, I am willing to change her to Keflex if that would be helpful  Also, please ask pt to go on MyChart and review her med list - almost all of her meds are gone - did she stop taking everything - buspar, zoloft, advair, gabapentin, albuterol, klonopin, diclofenac, robaxin, lasix, advair - are all missing from her list. The ONLY meds on her list are Fioricet, zofran, and some eye drops. Marland Kitchen Marland Kitchen

## 2017-08-05 ENCOUNTER — Other Ambulatory Visit: Payer: Self-pay

## 2017-08-05 ENCOUNTER — Ambulatory Visit (INDEPENDENT_AMBULATORY_CARE_PROVIDER_SITE_OTHER): Payer: BLUE CROSS/BLUE SHIELD | Admitting: Family Medicine

## 2017-08-05 ENCOUNTER — Encounter: Payer: Self-pay | Admitting: Family Medicine

## 2017-08-05 ENCOUNTER — Other Ambulatory Visit: Payer: Self-pay | Admitting: Family Medicine

## 2017-08-05 VITALS — BP 116/80 | HR 96 | Temp 99.2°F | Resp 16

## 2017-08-05 DIAGNOSIS — R112 Nausea with vomiting, unspecified: Secondary | ICD-10-CM

## 2017-08-05 DIAGNOSIS — R5383 Other fatigue: Secondary | ICD-10-CM

## 2017-08-05 DIAGNOSIS — D509 Iron deficiency anemia, unspecified: Secondary | ICD-10-CM | POA: Diagnosis not present

## 2017-08-05 DIAGNOSIS — R0989 Other specified symptoms and signs involving the circulatory and respiratory systems: Secondary | ICD-10-CM | POA: Diagnosis not present

## 2017-08-05 DIAGNOSIS — K582 Mixed irritable bowel syndrome: Secondary | ICD-10-CM

## 2017-08-05 DIAGNOSIS — R399 Unspecified symptoms and signs involving the genitourinary system: Secondary | ICD-10-CM

## 2017-08-05 DIAGNOSIS — R509 Fever, unspecified: Secondary | ICD-10-CM | POA: Diagnosis not present

## 2017-08-05 DIAGNOSIS — H6123 Impacted cerumen, bilateral: Secondary | ICD-10-CM

## 2017-08-05 LAB — POCT CBC
Granulocyte percent: 75.5 %G (ref 37–80)
HCT, POC: 35.7 % — AB (ref 37.7–47.9)
HEMOGLOBIN: 11.5 g/dL — AB (ref 12.2–16.2)
LYMPH, POC: 1.8 (ref 0.6–3.4)
MCH, POC: 25.3 pg — AB (ref 27–31.2)
MCHC: 32.2 g/dL (ref 31.8–35.4)
MCV: 78.7 fL — AB (ref 80–97)
MID (cbc): 0.5 (ref 0–0.9)
MPV: 7.4 fL (ref 0–99.8)
POC Granulocyte: 6.9 (ref 2–6.9)
POC LYMPH %: 19.4 % (ref 10–50)
POC MID %: 5.1 %M (ref 0–12)
Platelet Count, POC: 212 10*3/uL (ref 142–424)
RBC: 4.54 M/uL (ref 4.04–5.48)
RDW, POC: 16.2 %
WBC: 9.1 10*3/uL (ref 4.6–10.2)

## 2017-08-05 LAB — POC MICROSCOPIC URINALYSIS (UMFC): Mucus: ABSENT

## 2017-08-05 LAB — POCT URINALYSIS DIP (MANUAL ENTRY)
Bilirubin, UA: NEGATIVE
Blood, UA: NEGATIVE
Glucose, UA: NEGATIVE mg/dL
Ketones, POC UA: NEGATIVE mg/dL
LEUKOCYTES UA: NEGATIVE
NITRITE UA: NEGATIVE
PH UA: 6 (ref 5.0–8.0)
PROTEIN UA: NEGATIVE mg/dL
Spec Grav, UA: 1.015 (ref 1.010–1.025)
Urobilinogen, UA: 0.2 E.U./dL

## 2017-08-05 MED ORDER — PROMETHAZINE HCL 25 MG PO TABS: 25.0000 mg | ORAL_TABLET | Freq: Three times a day (TID) | ORAL | 3 refills | Status: DC | PRN

## 2017-08-05 MED ORDER — DUODERM CGF DRESSING EX MISC: CUTANEOUS | 11 refills | Status: AC

## 2017-08-05 MED ORDER — NEOMYCIN-POLYMYXIN-HC 3.5-10000-1 OT SUSP: 3.0000 [drp] | Freq: Four times a day (QID) | OTIC | 0 refills | Status: DC

## 2017-08-05 MED ORDER — DUODERM HYDROACTIVE EX GEL: CUTANEOUS | 11 refills | Status: DC

## 2017-08-05 NOTE — Progress Notes (Addendum)
Subjective:  By signing my name below, I, Essence Howell, attest that this documentation has been prepared under the direction and in the presence of [name], MD Electronically Signed: Ladene Artist, ED Scribe 08/05/2017 at 10:40 AM.   Patient ID: Carolyn Pena, female    DOB: 12-24-1975, 42 y.o.   MRN: [id number]  Chief Complaint  Patient presents with  . Back Pain    x 2 weeks - lower, went to Urgent 08/03/17 Dx with UTI  . Nausea    with vomiting  . Depression    score in triage 10   HPI Carolyn Pena is a 42 y.o. female who presents to Primary Care at [facility] complaining of decreased appetite over the past few wks. Pt states that symptoms began as eye pressure for several wks which has resolved, low back pain with urinating x 2 wks, feeling lethargic, generalized body aches, low grade fever ~100 with Tmax of 100.5 F x 2 wks, nausea, vomiting liquids and dry heaving. Bowels abnormal but that is normal for her due to the alternating constipation/diarrhea of IBS. She has tried Zofran without relief. Reports some intermittent diarrhea with periods of normal BMs as well. States she was seen at urgent care on 6/6 for symptoms, diagnosed with UTI, given Macrobid and Phenergan which she never started. Has zofran 64m at home from recent surgery but it wasn't helping at all. Reports that her WBC was 12000 and BP was 140/90 at that visit. States she has a h/o of being hospitalized twice for UTIs.  H/o Anemia Pt is not currently taking iron supplements; has not taken any in 20 yrs.  Wound Pt denies drainage, odor from the pressure ulcer/wound to her buttock. She has applied mupirocin and wound care spray that she ordered online.  Elevated CPR/Arthralgias Pt has seen rheum but wasn't started on meds.   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   Rash Pt also states that she noticed a pruritic, painful butterfly-shaped  rash to the back of her neck noticed by her caretaker yesterday so she has no idea how long it has been there -seems to come and go - thinks resolved by this morning. Denies new meds.   HTN BP140/90 out of office  IBS-C/D  Past Medical History:  Diagnosis Date  . Allergy   . Anemia   . Anxiety   . Arthritis   . Asthma   . Chronic paraplegia ([facility]   . Complication of anesthesia   . CP (cerebral palsy) ([facility]   . Depression   . Family history of adverse reaction to anesthesia    sister- severe n/v  . GERD (gastroesophageal reflux disease)   . H/O multiple concussions   . Headache   . Neuromuscular disorder ([facility]    CP  . PONV (postoperative nausea and vomiting)    Severe  . PTSD (post-traumatic stress disorder)   . UTI (urinary tract infection)    hospitalized   Current Outpatient Medications on File Prior to Visit  Medication Sig Dispense Refill  . butalbital-acetaminophen-caffeine (FIORICET, ESGIC) 50-325-40 MG tablet Take 1 tablet by mouth every 4 (four) hours as needed for headache. 14 tablet 3  . ondansetron (ZOFRAN-ODT) 4 MG disintegrating tablet Take 1-2 tablets (4-8 mg total) by mouth every 8 (eight) hours as needed for nausea or vomiting. 30 tablet 2  . Zinc Oxide (SECURA EX) Apply 1 application topically daily as needed (rash).    .Marland Kitchen  cephALEXin (KEFLEX) 500 MG capsule Take 1 capsule (500 mg total) by mouth 2 (two) times daily. (Patient not taking: Reported on 08/05/2017) 20 capsule 0  . neomycin-bacitracin-polymyxin (NEOSPORIN) ointment Apply 1 application topically as needed for wound care.     No current facility-administered medications on file prior to visit.    Past Surgical History:  Procedure Laterality Date  . achilles release Bilateral   . EYE SURGERY    . HAMSTRING RELEASE Bilateral   . HIP SURGERY Bilateral   . JOINT REPLACEMENT    . PHOTOCOAGULATION WITH LASER Bilateral 07/03/2017   Procedure: LASER DEMARCATION BILATERAL EYES;  Surgeon: Jalene Mullet,  MD;  Location: [facility];  Service: Ophthalmology;  Laterality: Bilateral;  . STRABISMUS SURGERY     2008, 1999   Allergies  Allergen Reactions  . Lidocaine Swelling    Had steroid injection doesn't know if it was the steroid or lidocaine. "Whole arm swelled & warm to touch. Severe inflamation."   . Augmentin [Amoxicillin-Pot Clavulanate] Diarrhea and Nausea And Vomiting    Has patient had a PCN reaction causing immediate rash, facial/tongue/throat swelling, SOB or lightheadedness with hypotension: No Has patient had a PCN reaction causing severe rash involving mucus membranes or skin necrosis: No Has patient had a PCN reaction that required hospitalization: No Has patient had a PCN reaction occurring within the last 10 years: #  #  #  YES  #  #  #  If all of the above answers are "NO", then may proceed with Cephalosporin use.   . Cefdinir Diarrhea  . Codeine Nausea And Vomiting  . Lortab [Hydrocodone-Acetaminophen] Nausea And Vomiting  . Methylprednisolone Rash    Had lidocaine as well  . Other Nausea And Vomiting    general anesthesia causes severe nausea and vomiting    Family History  Problem Relation Age of Onset  . Cancer Mother        CLL, decsd in 2012  . Cerebral palsy Sister   . Breast cancer Neg Hx    Social History   Socioeconomic History  . Marital status: Single    Spouse name: Not on file  . Number of children: Not on file  . Years of education: Not on file  . Highest education level: Not on file  Occupational History  . Not on file  Social Needs  . Financial resource strain: Not on file  . Food insecurity:    Worry: Not on file    Inability: Not on file  . Transportation needs:    Medical: Not on file    Non-medical: Not on file  Tobacco Use  . Smoking status: Never Smoker  . Smokeless tobacco: Never Used  Substance and Sexual Activity  . Alcohol use: Not on file  . Drug use: No  . Sexual activity: Not on file  Lifestyle  . Physical activity:     Days per week: Not on file    Minutes per session: Not on file  . Stress: Not on file  Relationships  . Social connections:    Talks on phone: Not on file    Gets together: Not on file    Attends religious service: Not on file    Active member of club or organization: Not on file    Attends meetings of clubs or organizations: Not on file    Relationship status: Not on file  Other Topics Concern  . Not on file  Social History Narrative   Pt has doctorate  degree   Depression screen [facility] 2/9 08/05/2017 04/22/2017 04/08/2017 12/03/2016 06/21/2015  Decreased Interest 2 0 0 0 0  Down, Depressed, Hopeless 0 0 0 0 0  PHQ - 2 Score 2 0 0 0 0  Altered sleeping 2 - - - -  Tired, decreased energy 3 - - - -  Change in appetite 3 - - - -  Feeling bad or failure about yourself  0 - - - -  Trouble concentrating 0 - - - -  Moving slowly or fidgety/restless 0 - - - -  Suicidal thoughts 0 - - - -  PHQ-9 Score 10 - - - -     Review of Systems  Constitutional: Positive for activity change, appetite change, chills, diaphoresis and fatigue.  HENT: Positive for ear pain, sinus pressure and sinus pain.   Eyes: Positive for pain (pressure; resolved).  Gastrointestinal: Positive for diarrhea (intermittent), nausea and vomiting.  Genitourinary: Positive for difficulty urinating and dysuria.  Musculoskeletal: Positive for arthralgias, back pain, gait problem, joint swelling and myalgias.  Skin: Positive for rash (resolved) and wound (buttock).  Psychiatric/Behavioral: Positive for dysphoric mood.      Objective:   Physical Exam  Constitutional: She is oriented to person, place, and time. She appears well-developed and well-nourished. No distress.  HENT:  Head: Normocephalic and atraumatic.  Right Ear: Tympanic membrane is injected.  Left Ear: Tympanic membrane is injected.  Cerumen bilaterally. L canal: erythema, edema, tenderness  Eyes: Conjunctivae and EOM are normal.  Neck: Neck supple. No tracheal  deviation present.  Cardiovascular: Normal rate.  Pulmonary/Chest: Effort normal. No respiratory distress.  Musculoskeletal: Normal range of motion.  Neurological: She is alert and oriented to person, place, and time.  Skin: Skin is warm and dry.  Psychiatric: She has a normal mood and affect. Her behavior is normal.  Nursing note and vitals reviewed.   BP 116/80 (BP Location: Left Arm)   Pulse 96   Temp 99.2 F (37.3 C) (Oral)   Resp 16   LMP 07/07/2017   SpO2 98%     Results for orders placed or performed in visit on 08/05/17  POCT CBC  Result Value Ref Range   WBC 9.1 4.6 - 10.2 K/uL   Lymph, poc 1.8 0.6 - 3.4   POC LYMPH PERCENT 19.4 10 - 50 %L   MID (cbc) 0.5 0 - 0.9   POC MID % 5.1 0 - 12 %M   POC Granulocyte 6.9 2 - 6.9   Granulocyte percent 75.5 37 - 80 %G   RBC 4.54 4.04 - 5.48 M/uL   Hemoglobin 11.5 (A) 12.2 - 16.2 g/dL   HCT, POC 35.7 (A) 37.7 - 47.9 %   MCV 78.7 (A) 80 - 97 fL   MCH, POC 25.3 (A) 27 - 31.2 pg   MCHC 32.2 31.8 - 35.4 g/dL   RDW, POC 16.2 %   Platelet Count, POC 212 142 - 424 K/uL   MPV 7.4 0 - 99.8 fL  POCT urinalysis dipstick  Result Value Ref Range   Color, UA yellow yellow   Clarity, UA clear clear   Glucose, UA negative negative mg/dL   Bilirubin, UA negative negative   Ketones, POC UA negative negative mg/dL   Spec Grav, UA 1.015 1.010 - 1.025   Blood, UA negative negative   pH, UA 6.0 5.0 - 8.0   Protein Ur, POC negative negative mg/dL   Urobilinogen, UA 0.2 0.2 or 1.0 E.U./dL   Nitrite,  UA Negative Negative   Leukocytes, UA Negative Negative  POCT Microscopic Urinalysis ([facility])  Result Value Ref Range   WBC,UR,HPF,POC None None WBC/hpf   RBC,UR,HPF,POC None None RBC/hpf   Bacteria None None, Too numerous to count   Mucus Absent Absent   Epithelial Cells, UR Per Microscopy Few (A) None, Too numerous to count cells/hpf   Assessment & Plan:   1. Urinary symptom or sign - ua appears completely normal but pt highly symptomatic  w/ signs of systemic infxn of unknown etiology so will check clx just to ensure we are not missing something. Snap rx for keflex given in case sxs worsen.  2. Microcytic anemia - highly suspect iron deficiency anemia so checking iron level - increase iron in diet and try to find iron supp that would be very gentle on bowels/low constipation sxs to take even 2-3x/wk if able.  3. Non-intractable vomiting with nausea, unspecified vomiting type - refilled prn promethazine  4. Sinus symptom   5. Fever of undetermined origin - suspect viral syndrome but gave snap rx for keflex in case urinary sx worsen - urticarial-like rash 2 days ago on back of neck - picture pt had on her phone of it several days ago above - currently possible some very mild/isolated areas of urticaria but may just be from pressure of headrest against neck.  . .   6. Lethargy   7. Bilateral impacted cerumen - try the cortisporin gtts as having some Rt canal tenderness/sensitivities after B lavage and Rt curette removal of cerumen    Orders Placed This Encounter  Procedures  . Urine Culture  . Respiratory virus panel  . Iron, TIBC and Ferritin Panel  . Comprehensive metabolic panel  . Sedimentation Rate  . C-reactive protein  . TSH  . POCT CBC  . POCT urinalysis dipstick  . POCT Microscopic Urinalysis ([facility])    Meds ordered this encounter  Medications  . Hydroactive Dressings (DUODERM HYDROACTIVE) GEL    Sig: Use as directed multiple times daily for gluteal pressure ulcer wound care    Dispense:  30 g    Refill:  11  . Control Gel Formula Dressing (DUODERM CGF DRESSING) MISC    Sig: Use as directed multiple times daily for gluteal pressure ulcer wound care    Dispense:  60 each    Refill:  11  . promethazine (PHENERGAN) 25 MG tablet    Sig: Take 1 tablet (25 mg total) by mouth every 8 (eight) hours as needed for nausea or vomiting.    Dispense:  20 tablet    Refill:  3  . neomycin-polymyxin-hydrocortisone  (CORTISPORIN) 3.5-10000-1 OTIC suspension    Sig: Place 3 drops into both ears 4 (four) times daily.    Dispense:  10 mL    Refill:  0   Over 40 min spent in face-to-face evaluation of and consultation with patient and coordination of care.  Over 50% of this time was spent counseling this patient regarding her long list of chronic prn medications - all meds were deleted from her [facility] by ophtho team during her outpt eye surgery - had to find old list of meds and reconcile each with pt's current bottles at home and if she is taking or not and if she needed refills and how to ether re-enter as historical or order new.for ~30 meds  I personally performed the services described in this documentation, which was scribed in my presence. The recorded information has been reviewed and considered,  and addended by me as needed.   [name], M.D.  Primary Care at [facility] [address] [place], [place] [address] [phone] phone [phone] fax  08/14/17 6:56 PM

## 2017-08-05 NOTE — Patient Instructions (Addendum)
You are deficient on iron which is causing an anemia (low red blood cells).  Either make sure you are getting plenty of high iron foods in your diet or try a slow release, low dose, GI gentle iron supplement like Nu-Iron or Slow-Fe. Ask your pharmacist for what is easiest on the GI system or get something online and ok to take just 2-3x/week as well.  I have included a list of high iron foods below.   IF you received an x-ray today, you will receive an invoice from [facility]. Please contact [facility] at [phone] with questions or concerns regarding your invoice.   IF you received labwork today, you will receive an invoice from [facility]. Please contact [facility] at [phone] with questions or concerns regarding your invoice.   Our billing staff will not be able to assist you with questions regarding bills from these companies.  You will be contacted with the lab results as soon as they are available. The fastest way to get your results is to activate your My Chart account. Instructions are located on the last page of this paperwork. If you have not heard from [place] regarding the results in 2 weeks, please contact this office.     Viral Illness, Adult Viruses are tiny germs that can get into a person's body and cause illness. There are many different types of viruses, and they cause many types of illness. Viral illnesses can range from mild to severe. They can affect various parts of the body. Common illnesses that are caused by a virus include colds and the flu. Viral illnesses also include serious conditions such as HIV/AIDS (human immunodeficiency virus/acquired immunodeficiency syndrome). A few viruses have been linked to certain cancers. What are the causes? Many types of viruses can cause illness. Viruses invade cells in your body, multiply, and cause the infected cells to malfunction or die. When the cell dies, it releases more of the virus. When this happens, you  develop symptoms of the illness, and the virus continues to spread to other cells. If the virus takes over the function of the cell, it can cause the cell to divide and grow out of control, as is the case when a virus causes cancer. Different viruses get into the body in different ways. You can get a virus by:  Swallowing food or water that is contaminated with the virus.  Breathing in droplets that have been coughed or sneezed into the air by an infected person.  Touching a surface that has been contaminated with the virus and then touching your eyes, nose, or mouth.  Being bitten by an insect or animal that carries the virus.  Having sexual contact with a person who is infected with the virus.  Being exposed to blood or fluids that contain the virus, either through an open cut or during a transfusion.  If a virus enters your body, your body's defense system (immune system) will try to fight the virus. You may be at higher risk for a viral illness if your immune system is weak. What are the signs or symptoms? Symptoms vary depending on the type of virus and the location of the cells that it invades. Common symptoms of the main types of viral illnesses include: Cold and flu viruses  Fever.  Headache.  Sore throat.  Muscle aches.  Nasal congestion.  Cough. Digestive system (gastrointestinal) viruses  Fever.  Abdominal pain.  Nausea.  Diarrhea. Liver viruses (hepatitis)  Loss of appetite.  Tiredness.  Yellowing  of the skin (jaundice). Brain and spinal cord viruses  Fever.  Headache.  Stiff neck.  Nausea and vomiting.  Confusion or sleepiness. Skin viruses  Warts.  Itching.  Rash. Sexually transmitted viruses  Discharge.  Swelling.  Redness.  Rash. How is this treated? Viruses can be difficult to treat because they live within cells. Antibiotic medicines do not treat viruses because these drugs do not get inside cells. Treatment for a viral  illness may include:  Resting and drinking plenty of fluids.  Medicines to relieve symptoms. These can include over-the-counter medicine for pain and fever, medicines for cough or congestion, and medicines to relieve diarrhea.  Antiviral medicines. These drugs are available only for certain types of viruses. They may help reduce flu symptoms if taken early. There are also many antiviral medicines for hepatitis and HIV/AIDS.  Some viral illnesses can be prevented with vaccinations. A common example is the flu shot. Follow these instructions at home: Medicines   Take over-the-counter and prescription medicines only as told by your health care provider.  If you were prescribed an antiviral medicine, take it as told by your health care provider. Do not stop taking the medicine even if you start to feel better.  Be aware of when antibiotics are needed and when they are not needed. Antibiotics do not treat viruses. If your health care provider thinks that you may have a bacterial infection as well as a viral infection, you may get an antibiotic. ? Do not ask for an antibiotic prescription if you have been diagnosed with a viral illness. That will not make your illness go away faster. ? Frequently taking antibiotics when they are not needed can lead to antibiotic resistance. When this develops, the medicine no longer works against the bacteria that it normally fights. General instructions  Drink enough fluids to keep your urine clear or pale yellow.  Rest as much as possible.  Return to your normal activities as told by your health care provider. Ask your health care provider what activities are safe for you.  Keep all follow-up visits as told by your health care provider. This is important. How is this prevented? Take these actions to reduce your risk of viral infection:  Eat a healthy diet and get enough rest.  Wash your hands often with soap and water. This is especially important when  you are in public places. If soap and water are not available, use hand sanitizer.  Avoid close contact with friends and family who have a viral illness.  If you travel to areas where viral gastrointestinal infection is common, avoid drinking water or eating raw food.  Keep your immunizations up to date. Get a flu shot every year as told by your health care provider.  Do not share toothbrushes, nail clippers, razors, or needles with other people.  Always practice safe sex.  Contact a health care provider if:  You have symptoms of a viral illness that do not go away.  Your symptoms come back after going away.  Your symptoms get worse. Get help right away if:  You have trouble breathing.  You have a severe headache or a stiff neck.  You have severe vomiting or abdominal pain. This information is not intended to replace advice given to you by your health care provider. Make sure you discuss any questions you have with your health care provider. Document Released: 06/26/2015 Document Revised: 07/29/2015 Document Reviewed: 06/26/2015 Elsevier Interactive Patient Education  2018 [employer].  Fatigue Fatigue  is feeling tired all of the time, a lack of energy, or a lack of motivation. Occasional or mild fatigue is often a normal response to activity or life in general. However, long-lasting (chronic) or extreme fatigue may indicate an underlying medical condition. Follow these instructions at home: Watch your fatigue for any changes. The following actions may help to lessen any discomfort you are feeling:  Talk to your health care provider about how much sleep you need each night. Try to get the required amount every night.  Take medicines only as directed by your health care provider.  Eat a healthy and nutritious diet. Ask your health care provider if you need help changing your diet.  Drink enough fluid to keep your urine clear or pale yellow.  Practice ways of relaxing, such  as yoga, meditation, massage therapy, or acupuncture.  Exercise regularly.  Change situations that cause you stress. Try to keep your work and personal routine reasonable.  Do not abuse illegal drugs.  Limit alcohol intake to no more than 1 drink per day for nonpregnant women and 2 drinks per day for men. One drink equals 12 ounces of beer, 5 ounces of wine, or 1 ounces of hard liquor.  Take a multivitamin, if directed by your health care provider.  Contact a health care provider if:  Your fatigue does not get better.  You have a fever.  You have unintentional weight loss or gain.  You have headaches.  You have difficulty: ? Falling asleep. ? Sleeping throughout the night.  You feel angry, guilty, anxious, or sad.  You are unable to have a bowel movement (constipation).  You skin is dry.  Your legs or another part of your body is swollen. Get help right away if:  You feel confused.  Your vision is blurry.  You feel faint or pass out.  You have a severe headache.  You have severe abdominal, pelvic, or back pain.  You have chest pain, shortness of breath, or an irregular or fast heartbeat.  You are unable to urinate or you urinate less than normal.  You develop abnormal bleeding, such as bleeding from the rectum, vagina, nose, lungs, or nipples.  You vomit blood.  You have thoughts about harming yourself or committing suicide.  You are worried that you might harm someone else. This information is not intended to replace advice given to you by your health care provider. Make sure you discuss any questions you have with your health care provider. Document Released: 12/12/2006 Document Revised: 07/23/2015 Document Reviewed: 06/18/2013 Elsevier Interactive Patient Education  2018 [employer].  Iron Deficiency Anemia, Adult Iron deficiency anemia is a condition in which the concentration of red blood cells or hemoglobin in the blood is below normal because of  too little iron. Hemoglobin is a substance in red blood cells that carries oxygen to the body's tissues. When the concentration of red blood cells or hemoglobin is too low, not enough oxygen reaches these tissues. Iron deficiency anemia is usually long-lasting (chronic) and it develops over time. It may or may not cause symptoms. It is a common type of anemia. What are the causes? This condition may be caused by:  Not enough iron in the diet.  Blood loss caused by bleeding in the intestine.  Blood loss from a gastrointestinal condition like Crohn disease.  Frequent blood draws, such as from blood donation.  Abnormal absorption in the gut.  Heavy menstrual periods in women.  Cancers of the gastrointestinal system,  such as colon cancer.  What are the signs or symptoms? Symptoms of this condition may include:  Fatigue.  Headache.  Pale skin, lips, and nail beds.  Poor appetite.  Weakness.  Shortness of breath.  Dizziness.  Cold hands and feet.  Fast or irregular heartbeat.  Irritability. This is more common in severe anemia.  Rapid breathing. This is more common in severe anemia.  Mild anemia may not cause any symptoms. How is this diagnosed? This condition is diagnosed based on:  Your medical history.  A physical exam.  Blood tests.  You may have additional tests to find the underlying cause of your anemia, such as:  Testing for blood in the stool (fecal occult blood test).  A procedure to see inside your colon and rectum (colonoscopy).  A procedure to see inside your esophagus and stomach (endoscopy).  A test in which cells are removed from bone marrow (bone marrow aspiration) or fluid is removed from the bone marrow to be examined (biopsy). This is rarely needed.  How is this treated? This condition is treated by correcting the cause of your iron deficiency. Treatment may involve:  Adding iron-rich foods to your diet.  Taking iron supplements. If  you are pregnant or breastfeeding, you may need to take extra iron because your normal diet usually does not provide the amount of iron that you need.  Increasing vitamin C intake. Vitamin C helps your body absorb iron. Your health care provider may recommend that you take iron supplements along with a glass of orange juice or a vitamin C supplement.  Medicines to make heavy menstrual flow lighter.  Surgery.  You may need repeat blood tests to determine whether treatment is working. Depending on the underlying cause, the anemia should be corrected within 2 months of starting treatment. If the treatment does not seem to be working, you may need more testing. Follow these instructions at home: Medicines  Take over-the-counter and prescription medicines only as told by your health care provider. This includes iron supplements and vitamins.  If you cannot tolerate taking iron supplements by mouth, talk with your health care provider about taking them through a vein (intravenously) or an injection into a muscle.  For the best iron absorption, you should take iron supplements when your stomach is empty. If you cannot tolerate them on an empty stomach, you may need to take them with food.  Do not drink milk or take antacids at the same time as your iron supplements. Milk and antacids may interfere with iron absorption.  Iron supplements can cause constipation. To prevent constipation, include fiber in your diet as told by your health care provider. A stool softener may also be recommended. Eating and drinking  Talk with your health care provider before changing your diet. He or she may recommend that you eat foods that contain a lot of iron, such as: ? Liver. ? Low-fat (lean) beef. ? Breads and cereals that have iron added to them (are fortified). ? Eggs. ? Dried fruit. ? Dark green, leafy vegetables.  To help your body use the iron from iron-rich foods, eat those foods at the same time as  fresh fruits and vegetables that are high in vitamin C. Foods that are high in vitamin C include: ? Oranges. ? Peppers. ? Tomatoes. ? Mangoes.  Drinkenoughfluid to keep your urine clear or pale yellow. General instructions  Return to your normal activities as told by your health care provider. Ask your health care provider what  activities are safe for you.  Practice good hygiene. Anemia can make you more prone to illness and infection.  Keep all follow-up visits as told by your health care provider. This is important. Contact a health care provider if:  You feel nauseous or you vomit.  You feel weak.  You have unexplained sweating.  You develop symptoms of constipation, such as: ? Having fewer than three bowel movements a week. ? Straining to have a bowel movement. ? Having stools that are hard, dry, or larger than normal. ? Feeling full or bloated. ? Pain in the lower abdomen. ? Not feeling relief after having a bowel movement. Get help right away if:  You faint. If this happens, do not drive yourself to the hospital. Call your local emergency services (911 in the [place]  You have chest pain.  You have shortness of breath that: ? Is severe. ? Gets worse with physical activity.  You have a rapid heartbeat.  You become light-headed when getting up from a sitting or lying down position. This information is not intended to replace advice given to you by your health care provider. Make sure you discuss any questions you have with your health care provider. Document Released: 02/12/2000 Document Revised: 11/04/2015 Document Reviewed: 11/04/2015 Elsevier Interactive Patient Education  2018 [employer].  Iron-Rich Diet Iron is a mineral that helps your body to produce hemoglobin. Hemoglobin is a protein in your red blood cells that carries oxygen to your body's tissues. Eating too little iron may cause you to feel weak and tired, and it can increase your risk for  infection. Eating enough iron is necessary for your body's metabolism, muscle function, and nervous system. Iron is naturally found in many foods. It can also be added to foods or fortified in foods. There are two types of dietary iron:  Heme iron. Heme iron is absorbed by the body more easily than nonheme iron. Heme iron is found in meat, poultry, and fish.  Nonheme iron. Nonheme iron is found in dietary supplements, iron-fortified grains, beans, and vegetables.  You may need to follow an iron-rich diet if:  You have been diagnosed with iron deficiency or iron-deficiency anemia.  You have a condition that prevents you from absorbing dietary iron, such as: ? Infection in your intestines. ? Celiac disease. This involves long-lasting (chronic) inflammation of your intestines.  You do not eat enough iron.  You eat a diet that is high in foods that impair iron absorption.  You have lost a lot of blood.  You have heavy bleeding during your menstrual cycle.  You are pregnant.  What is my plan? Your health care provider may help you to determine how much iron you need per day based on your condition. Generally, when a person consumes sufficient amounts of iron in the diet, the following iron needs are met:  Men. ? 24-99 years old: 11 mg per day. ? 77-50 years old: 8 mg per day.  Women. ? 61-66 years old: 15 mg per day. ? 13-89 years old: 18 mg per day. ? Over 22 years old: 8 mg per day. ? Pregnant women: 27 mg per day. ? Breastfeeding women: 9 mg per day.  What do I need to know about an iron-rich diet?  Eat fresh fruits and vegetables that are high in vitamin C along with foods that are high in iron. This will help increase the amount of iron that your body absorbs from food, especially with foods containing nonheme iron.  Foods that are high in vitamin C include oranges, peppers, tomatoes, and mango.  Take iron supplements only as directed by your health care provider. Overdose  of iron can be life-threatening. If you were prescribed iron supplements, take them with orange juice or a vitamin C supplement.  Cook foods in pots and pans that are made from iron.  Eat nonheme iron-containing foods alongside foods that are high in heme iron. This helps to improve your iron absorption.  Certain foods and drinks contain compounds that impair iron absorption. Avoid eating these foods in the same meal as iron-rich foods or with iron supplements. These include: ? Coffee, black tea, and red wine. ? Milk, dairy products, and foods that are high in calcium. ? Beans, soybeans, and peas. ? Whole grains.  When eating foods that contain both nonheme iron and compounds that impair iron absorption, follow these tips to absorb iron better. ? Soak beans overnight before cooking. ? Soak whole grains overnight and drain them before using. ? Ferment flours before baking, such as using yeast in bread dough. What foods can I eat? Grains Iron-fortified breakfast cereal. Iron-fortified whole-wheat bread. Enriched rice. Sprouted grains. Vegetables Spinach. Potatoes with skin. Green peas. Broccoli. Red and green bell peppers. Fermented vegetables. Fruits Prunes. Raisins. Oranges. Strawberries. Mango. Grapefruit. Meats and Other Protein Sources Beef liver. Oysters. Beef. Shrimp. [place]. Chicken. Maxeys. Sardines. Chickpeas. Nuts. Tofu. Beverages Tomato juice. Fresh orange juice. Prune juice. Hibiscus tea. Fortified instant breakfast shakes. Condiments Tahini. Fermented soy sauce. Sweets and Desserts Black-strap molasses. Other Wheat germ. The items listed above may not be a complete list of recommended foods or beverages. Contact your dietitian for more options. What foods are not recommended? Grains Whole grains. Bran cereal. Bran flour. Oats. Vegetables Artichokes. Brussels sprouts. Kale. Fruits Blueberries. Raspberries. Strawberries. Figs. Meats and Other Protein  Sources Soybeans. Products made from soy protein. Dairy Milk. Cream. Cheese. Yogurt. Cottage cheese. Beverages Coffee. Black tea. Red wine. Sweets and Desserts Cocoa. Chocolate. Ice cream. Other Basil. Oregano. Parsley. The items listed above may not be a complete list of foods and beverages to avoid. Contact your dietitian for more information. This information is not intended to replace advice given to you by your health care provider. Make sure you discuss any questions you have with your health care provider. Document Released: 09/28/2004 Document Revised: 09/04/2015 Document Reviewed: 09/11/2013 Elsevier Interactive Patient Education  [employer].

## 2017-08-06 LAB — IRON,TIBC AND FERRITIN PANEL
Ferritin: 64 ng/mL (ref 15–150)
Iron Saturation: 10 % — ABNORMAL LOW (ref 15–55)
Iron: 28 ug/dL (ref 27–159)
Total Iron Binding Capacity: 274 ug/dL (ref 250–450)
UIBC: 246 ug/dL (ref 131–425)

## 2017-08-06 LAB — COMPREHENSIVE METABOLIC PANEL
ALBUMIN: 4.1 g/dL (ref 3.5–5.5)
ALT: 13 IU/L (ref 0–32)
AST: 12 IU/L (ref 0–40)
Albumin/Globulin Ratio: 1.7 (ref 1.2–2.2)
Alkaline Phosphatase: 70 IU/L (ref 39–117)
BILIRUBIN TOTAL: 0.3 mg/dL (ref 0.0–1.2)
BUN / CREAT RATIO: 8 — AB (ref 9–23)
BUN: 4 mg/dL — AB (ref 6–24)
CHLORIDE: 103 mmol/L (ref 96–106)
CO2: 24 mmol/L (ref 20–29)
CREATININE: 0.52 mg/dL — AB (ref 0.57–1.00)
Calcium: 9 mg/dL (ref 8.7–10.2)
GFR calc non Af Amer: 118 mL/min/{1.73_m2} (ref >59)
GFR, EST AFRICAN AMERICAN: 136 mL/min/{1.73_m2} (ref >59)
GLOBULIN, TOTAL: 2.4 g/dL (ref 1.5–4.5)
Glucose: 90 mg/dL (ref 65–99)
Potassium: 3.8 mmol/L (ref 3.5–5.2)
SODIUM: 142 mmol/L (ref 134–144)
TOTAL PROTEIN: 6.5 g/dL (ref 6.0–8.5)

## 2017-08-06 LAB — C-REACTIVE PROTEIN: CRP: 44.6 mg/L — ABNORMAL HIGH (ref 0.0–4.9)

## 2017-08-06 LAB — SEDIMENTATION RATE: Sed Rate: 36 mm/hr — ABNORMAL HIGH (ref 0–32)

## 2017-08-06 LAB — TSH: TSH: 1.99 u[IU]/mL (ref 0.450–4.500)

## 2017-08-07 NOTE — Telephone Encounter (Signed)
Pt notified via mychart to update medication list.

## 2017-08-08 ENCOUNTER — Encounter: Payer: Self-pay | Admitting: Family Medicine

## 2017-08-08 LAB — URINE CULTURE

## 2017-08-10 LAB — RESPIRATORY VIRUS PANEL
Adenovirus: NEGATIVE
Influenza A: NEGATIVE
Influenza B: NEGATIVE
Metapneumovirus: NEGATIVE
Parainfluenza 1: NEGATIVE
Parainfluenza 2: NEGATIVE
Parainfluenza 3: NEGATIVE
Respiratory Syncytial Virus A: NEGATIVE
Respiratory Syncytial Virus B: NEGATIVE
Rhinovirus: NEGATIVE

## 2017-08-11 ENCOUNTER — Encounter: Payer: Self-pay | Admitting: Family Medicine

## 2017-08-11 NOTE — Telephone Encounter (Signed)
Phone call to patient to discuss hoyer lift, unable to reach. If patient calls back, is there anywhere specific she wants her prescription for her Harrel Lemon lift to go?

## 2017-08-14 ENCOUNTER — Encounter: Payer: Self-pay | Admitting: Family Medicine

## 2017-08-14 MED ORDER — SERTRALINE HCL 100 MG PO TABS: 200.0000 mg | ORAL_TABLET | Freq: Every day | ORAL | 1 refills | Status: DC

## 2017-08-14 MED ORDER — SULFAMETHOXAZOLE-TRIMETHOPRIM 800-160 MG PO TABS: 1.0000 | ORAL_TABLET | Freq: Two times a day (BID) | ORAL | 0 refills | Status: DC

## 2017-08-24 ENCOUNTER — Ambulatory Visit: Payer: Self-pay | Admitting: Family Medicine

## 2017-08-26 IMAGING — MR MR FOOT*L* W/O CM
4 of 8 series · 19 of 40 positions shown · non-contrast
Comparison: None.

CLINICAL DATA: Severe left lower extremity pain and swelling.

EXAM:
MRI OF THE LEFT FOOT WITHOUT CONTRAST; MRI OF LOWER LEFT EXTREMITY
WITHOUT CONTRAST
TECHNIQUE: Multiplanar, multisequence MR imaging of the left lower extremity
was performed. No intravenous contrast was administered.

[Series 3: T2 fat-sat · coronal · 4.0mm · 0.27mm/px · 6 of 33 slices shown (1 of 3)]
[im 1/33]
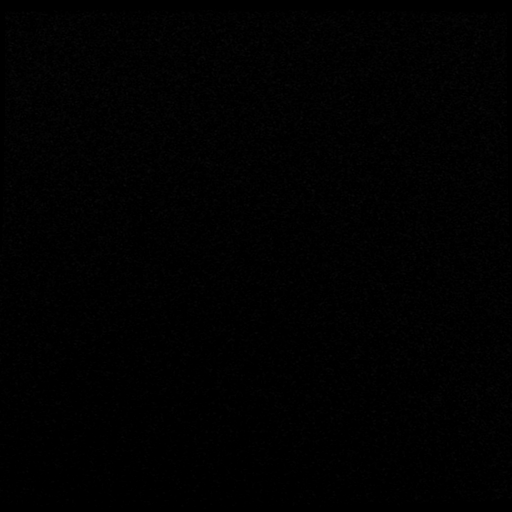
[im 7/33]
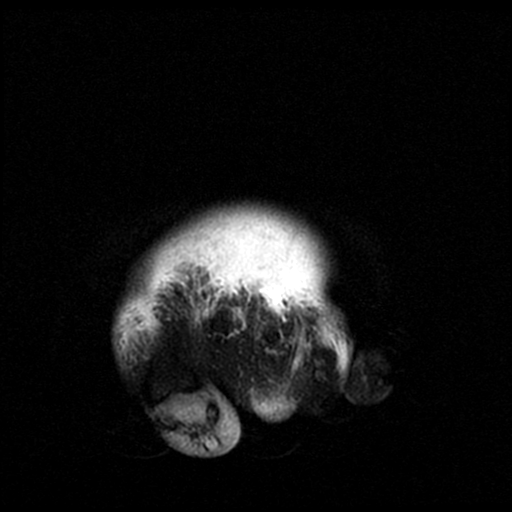
[im 13/33]
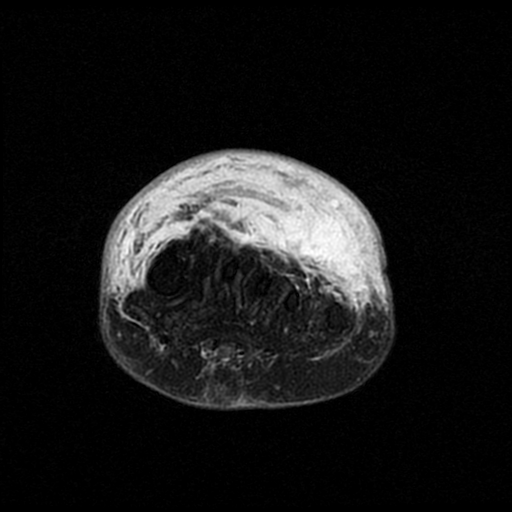
[im 20/33]
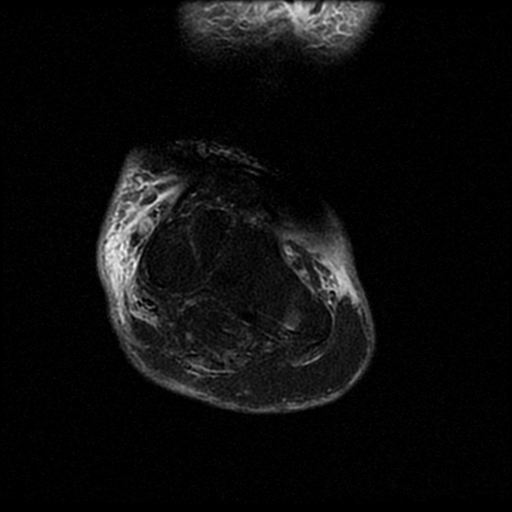
[im 26/33]
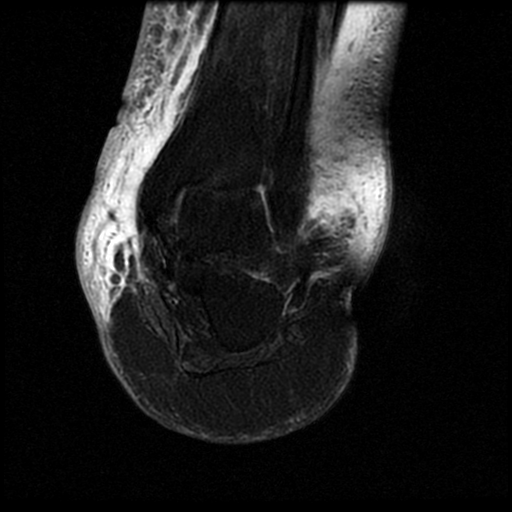
[im 33/33]
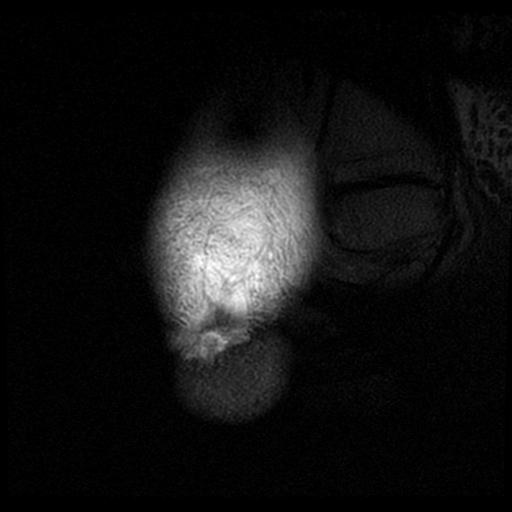

[Series 6: T2 fat-sat · axial · 3.0mm · 0.49mm/px · z∈[-102,-18]mm · 5 of 28 slices shown (2 of 3)]
[im 1/28]
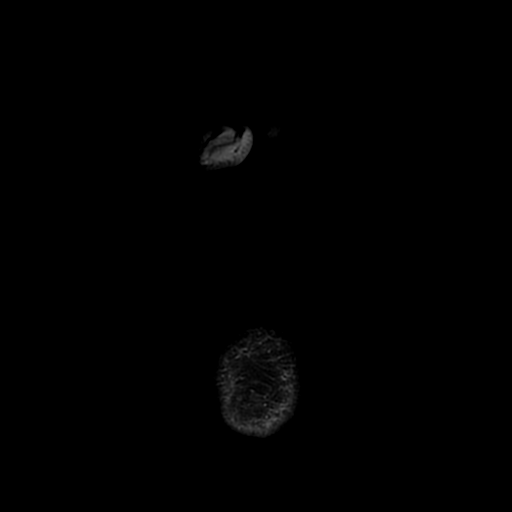
[im 7/28]
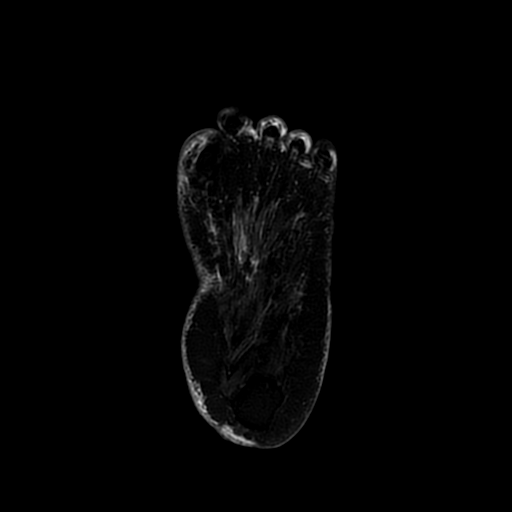
[im 14/28]
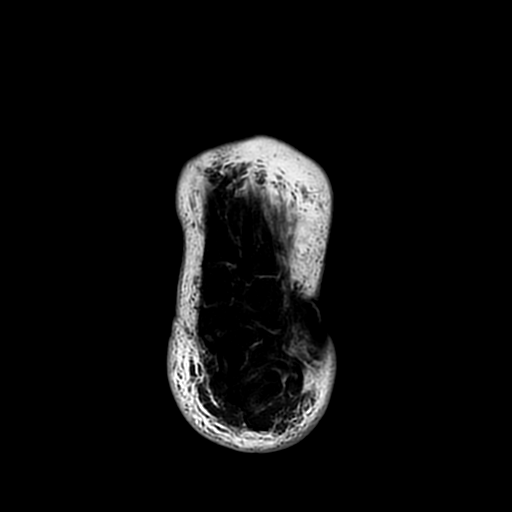
[im 21/28]
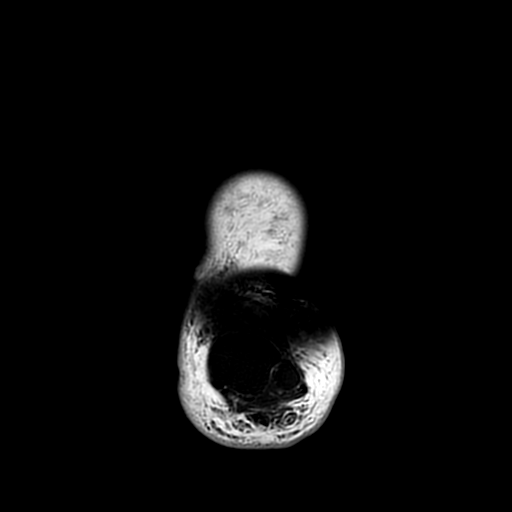
[im 28/28]
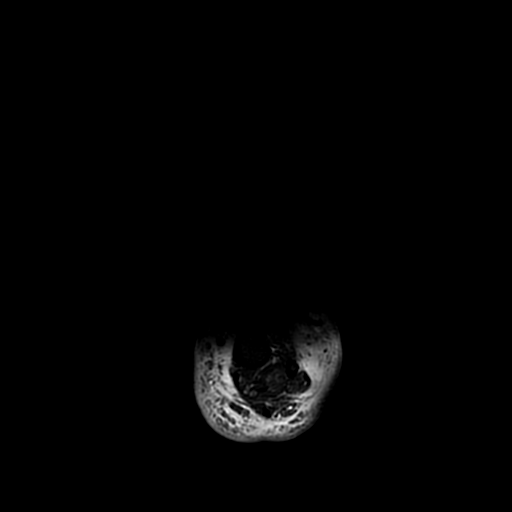

[Series 10: T2 fat-sat · sagittal · 3.0mm · 0.35mm/px · 3 of 27 slices shown (3 of 3)]
[im 1/27]
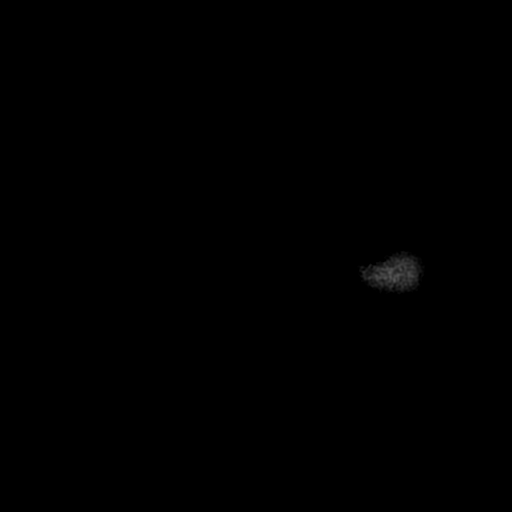
[im 18/27]
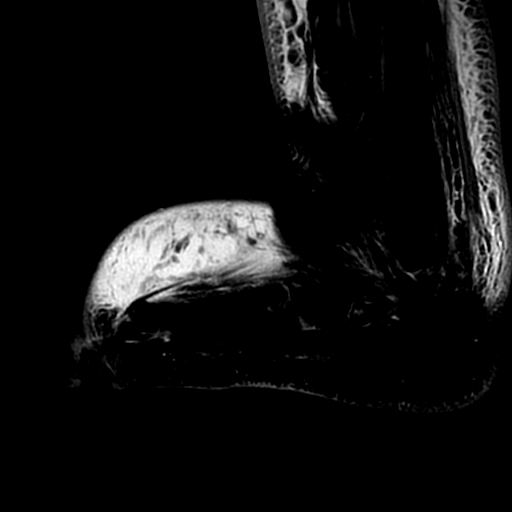
[im 27/27]
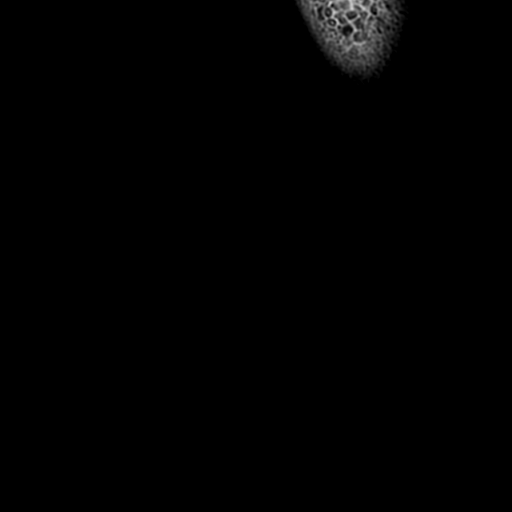

[Series 13: PD fat-sat · coronal · 4.0mm · 0.27mm/px · 5 of 33 slices shown]
[im 1/33]
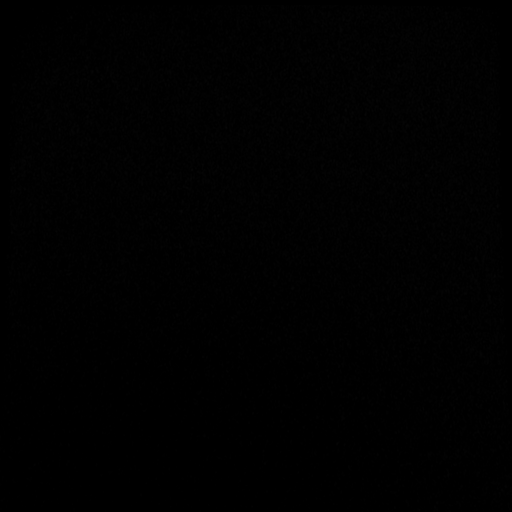
[im 9/33]
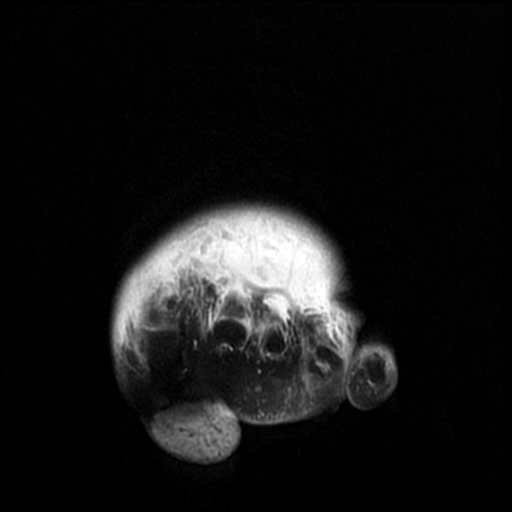
[im 17/33]
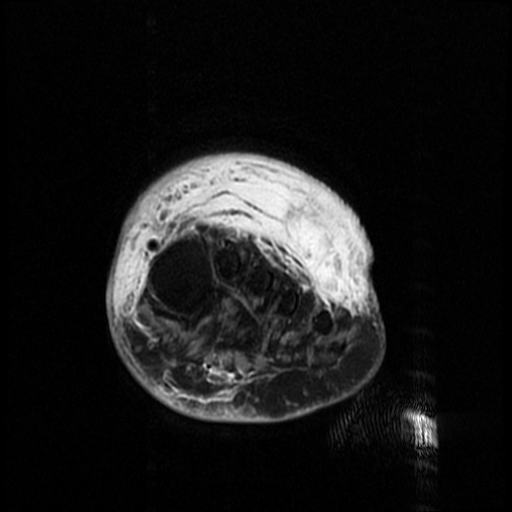
[im 25/33]
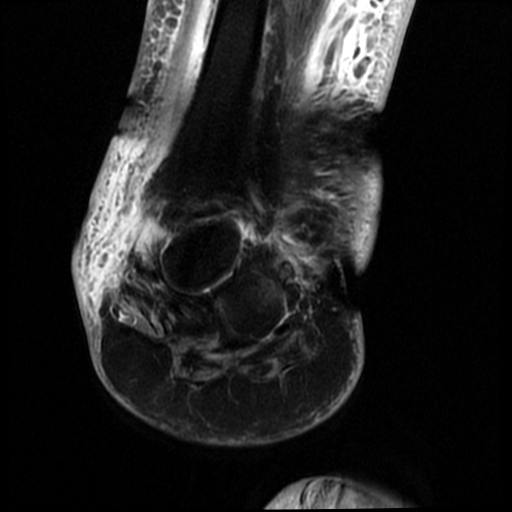
[im 33/33]
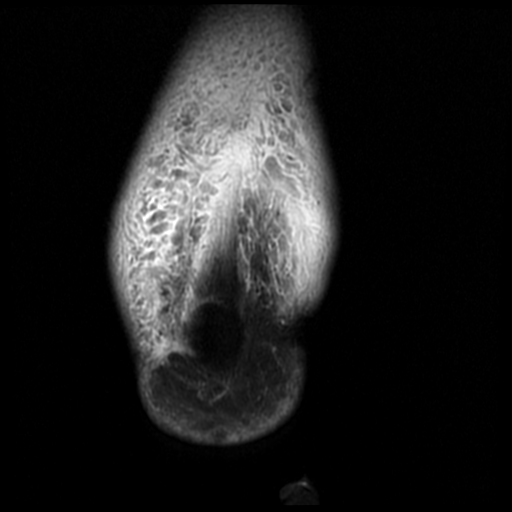

[19 of 40 positions shown; findings below may reference images not displayed]

FINDINGS: Severe diffuse subcutaneous soft tissue swelling/ edema/fluid
involving both lower extremities, left greater than right and most
significant involving the lower aspect of the left leg and left
foot. Massive soft tissue swelling involving the dorsum of the left
foot. No obvious discrete drainable fluid collection to suggest an
abscess.

No findings for myelo fasciitis or pyomyositis. There is extensive
fatty atrophy of the calf musculature bilaterally possibly due to a
denervation process or diabetic muscle infarcts or disuse.

No findings for septic arthritis or osteomyelitis.
IMPRESSION: 1. Bilateral lower extremity subcutaneous soft tissue
swelling/edema/fluid most likely due to cellulitis. This is most
significant involving the lower aspect of the leg and the foot on
the left side.
2. No discrete drainable fluid collection to suggest an abscess.
3. No findings for myofasciitis, pyomyositis, septic arthritis or
osteomyelitis.
4. Advanced fatty atrophy of the calf musculature bilaterally.

## 2017-08-30 ENCOUNTER — Encounter: Payer: Self-pay | Admitting: Family Medicine

## 2017-08-30 NOTE — Telephone Encounter (Signed)
Can someone please help get duoderm rxs sent to another company?    Last message sent by Claiborne Billings on 6/13 states: "[facility] could not fill the Duoderm bandages Rx or the Duoderm Gel Rx.When time permits, can it be called in to either [facility] or [facility]?Offhand, I cannot recall which company you had to send the Rx to last time.If it would be easier for you to fax or email me an electronic copy of the Rx I can call and find out."  I rxed both of these Duoderms items electronically to [facility] 08/05/17 and are still on her med list so just need to be resent in to some other location of her preference or printed out and write on rxs where they need to be faxed to then placed into my box for sig. Thanks.

## 2017-09-15 ENCOUNTER — Encounter: Payer: Self-pay | Admitting: Family Medicine

## 2017-09-16 ENCOUNTER — Encounter: Payer: Self-pay | Admitting: Family Medicine

## 2017-09-18 ENCOUNTER — Encounter: Payer: Self-pay | Admitting: Emergency Medicine

## 2017-09-18 ENCOUNTER — Ambulatory Visit (INDEPENDENT_AMBULATORY_CARE_PROVIDER_SITE_OTHER): Payer: BLUE CROSS/BLUE SHIELD | Admitting: Emergency Medicine

## 2017-09-18 VITALS — BP 139/89 | HR 102 | Temp 99.5°F | Resp 18

## 2017-09-18 DIAGNOSIS — J3489 Other specified disorders of nose and nasal sinuses: Secondary | ICD-10-CM

## 2017-09-18 DIAGNOSIS — J0101 Acute recurrent maxillary sinusitis: Secondary | ICD-10-CM

## 2017-09-18 DIAGNOSIS — H9203 Otalgia, bilateral: Secondary | ICD-10-CM | POA: Diagnosis not present

## 2017-09-18 MED ORDER — SULFAMETHOXAZOLE-TRIMETHOPRIM 800-160 MG PO TABS: 1.0000 | ORAL_TABLET | Freq: Two times a day (BID) | ORAL | 0 refills | Status: AC

## 2017-09-18 MED ORDER — NEOMYCIN-POLYMYXIN-HC 3.5-10000-1 OT SUSP: 3.0000 [drp] | Freq: Four times a day (QID) | OTIC | 0 refills | Status: DC

## 2017-09-18 NOTE — Patient Instructions (Addendum)
IF you received an x-ray today, you will receive an invoice from [facility]. Please contact [facility] at [phone] with questions or concerns regarding your invoice.   IF you received labwork today, you will receive an invoice from [facility]. Please contact [facility] at [phone] with questions or concerns regarding your invoice.   Our billing staff will not be able to assist you with questions regarding bills from these companies.  You will be contacted with the lab results as soon as they are available. The fastest way to get your results is to activate your My Chart account. Instructions are located on the last page of this paperwork. If you have not heard from [place] regarding the results in 2 weeks, please contact this office.      Earache, Adult An earache, or ear pain, can be caused by many things, including:  An infection.  Ear wax buildup.  Ear pressure.  Something in the ear that should not be there (foreign body).  A sore throat.  Tooth problems.  Jaw problems.  Treatment of the earache will depend on the cause. If the cause is not clear or cannot be determined, you may need to watch your symptoms until your earache goes away or until a cause is found. Follow these instructions at home: Pay attention to any changes in your symptoms. Take these actions to help with your pain:  Take or apply over-the-counter and prescription medicines only as told by your health care provider.  If you were prescribed an antibiotic medicine, use it as told by your health care provider. Do not stop using the antibiotic even if you start to feel better.  Do not put anything in your ear other than medicine that is prescribed by your health care provider.  If directed, apply heat to the affected area as often as told by your health care provider. Use the heat source that your health care provider recommends, such as a moist heat pack or a heating pad. ? Place a  towel between your skin and the heat source. ? Leave the heat on for 20-30 minutes. ? Remove the heat if your skin turns bright red. This is especially important if you are unable to feel pain, heat, or cold. You may have a greater risk of getting burned.  If directed, put ice on the ear: ? Put ice in a plastic bag. ? Place a towel between your skin and the bag. ? Leave the ice on for 20 minutes, 2-3 times a day.  Try resting in an upright position instead of lying down. This may help to reduce pressure in your ear and relieve pain.  Chew gum if it helps to relieve your ear pain.  Treat any allergies as told by your health care provider.  Keep all follow-up visits as told by your health care provider. This is important.  Contact a health care provider if:  Your pain does not improve within 2 days.  Your earache gets worse.  You have new symptoms.  You have a fever. Get help right away if:  You have a severe headache.  You have a stiff neck.  You have trouble swallowing.  You have redness or swelling behind your ear.  You have fluid or blood coming from your ear.  You have hearing loss.  You feel dizzy. This information is not intended to replace advice given to you by your health care provider. Make sure you discuss any questions you have with  your health care provider. Document Released: 10/02/2003 Document Revised: 10/13/2015 Document Reviewed: 08/10/2015 Elsevier Interactive Patient Education  [employer].

## 2017-09-18 NOTE — Progress Notes (Signed)
Carolyn Pena 42 y.o.   Chief Complaint  Patient presents with  . Ear Pain    ongoing right ear pain; took medication which helped but pain came back    HISTORY OF PRESENT ILLNESS: This is a 42 y.o. female complaining of bilateral ear pain and possible sinus infection with facial pain.  Was seen here by Dr. Ray Church last month and was started on Bactrim DS and eardrops with good results.  Patient was fine for 1 to 2 weeks and now the symptoms have come back.  HPI   Prior to Admission medications   Medication Sig Start Date End Date Taking? Authorizing Provider  azelastine (ASTELIN) 0.1 % nasal spray Place 2 sprays into the nose 2 (two) times daily. 12/30/16 12/30/17  [provider]  butalbital-acetaminophen-caffeine [employer], ESGIC) [phone] MG tablet Take 1 tablet by mouth every 4 (four) hours as needed for headache. 07/28/17   Shawnee Knapp, MD  Control Gel Formula Dressing (DUODERM CGF DRESSING) MISC Use as directed multiple times daily for gluteal pressure ulcer wound care 08/05/17   Shawnee Knapp, MD  diphenoxylate-atropine (LOMOTIL) 2.5-0.025 MG tablet TAKE 1 TABLET BY MOUTH 4 TIMES DAILY FOR DIARRHEA 08/07/17   Shawnee Knapp, MD  Fluticasone-Salmeterol (ADVAIR DISKUS) 250-50 MCG/DOSE AEPB Inhale 1 puff into the lungs 2 (two) times daily. 12/30/16   [provider]  gabapentin (NEURONTIN) 300 MG capsule Take 600 mg by mouth 3 (three) times daily. 07/25/17   [provider]  Hydroactive Dressings (DUODERM HYDROACTIVE) GEL Use as directed multiple times daily for gluteal pressure ulcer wound care 08/05/17   Shawnee Knapp, MD  levocetirizine (XYZAL) 5 MG tablet Take 5 mg by mouth every evening. 07/25/17   [provider]  mupirocin ointment (BACTROBAN) 2 % Apply 1 application topically 3 (three) times daily as needed. 07/25/17   [provider]  neomycin-bacitracin-polymyxin (NEOSPORIN) ointment Apply 1 application topically as needed for wound care.     [provider]  neomycin-polymyxin-hydrocortisone (CORTISPORIN) 3.5-10000-1 OTIC suspension Place 3 drops into both ears 4 (four) times daily. 08/05/17   Shawnee Knapp, MD  promethazine (PHENERGAN) 25 MG tablet Take 1 tablet (25 mg total) by mouth every 8 (eight) hours as needed for nausea or vomiting. 08/05/17   Shawnee Knapp, MD  sertraline (ZOLOFT) 100 MG tablet Take 2 tablets (200 mg total) by mouth daily. 08/14/17   Shawnee Knapp, MD  sulfamethoxazole-trimethoprim (BACTRIM DS,SEPTRA DS) 800-160 MG tablet Take 1 tablet by mouth 2 (two) times daily. 08/14/17   Shawnee Knapp, MD  Zinc Oxide [facility] EX) Apply 1 application topically daily as needed (rash).    [provider]    Allergies  Allergen Reactions  . Lidocaine Swelling    Had steroid injection doesn't know if it was the steroid or lidocaine. "Whole arm swelled & warm to touch. Severe inflamation."   . Augmentin [Amoxicillin-Pot Clavulanate] Diarrhea and Nausea And Vomiting    Has patient had a PCN reaction causing immediate rash, facial/tongue/throat swelling, SOB or lightheadedness with hypotension: No Has patient had a PCN reaction causing severe rash involving mucus membranes or skin necrosis: No Has patient had a PCN reaction that required hospitalization: No Has patient had a PCN reaction occurring within the last 10 years: #  #  #  YES  #  #  #  If all of the above answers are "NO", then may proceed with Cephalosporin use.   . Cefdinir Diarrhea  .  Codeine Nausea And Vomiting  . Lortab [Hydrocodone-Acetaminophen] Nausea And Vomiting  . Methylprednisolone Rash    Had lidocaine as well  . Other Nausea And Vomiting    general anesthesia causes severe nausea and vomiting     Patient Active Problem List   Diagnosis Date Noted  . Ringing in ears 08/04/2017  . Night sweats 08/04/2017  . Nausea and vomiting 08/04/2017  . Elevated C-reactive protein (CRP) 06/15/2017  . Muscle weakness 10/11/2016  . Joint pain 10/11/2016   . History of pneumonia 10/11/2016  . Hearing loss 10/11/2016  . Fatigue 10/11/2016  . Dry eyes 10/11/2016  . Vertigo 10/11/2016  . Constipation 10/11/2016  . 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 Chronic migraine without aura without status migrainosus, not intractable 03/10/2015  . CRPS (complex regional pain syndrome), upper limb 02/28/2015  . Anxiety state 02/28/2015  . Migraine 02/28/2015  . Asthma 02/28/2015  . Autoimmune disorder ([place]) 02/28/2015  . Cerebral palsy, hemiplegic ([place]) 02/28/2015  . Wheelchair dependent 02/28/2015  . Chronic back pain 02/28/2015  . GERD (gastroesophageal reflux disease) 02/28/2015  . Insomnia 02/28/2015  . 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 Morbid obesity ([place]) 10/26/2014  . Goiter 10/26/2014  . Rupture of right biceps tendon 10/09/2014  . 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 Numbness and tingling in both hands 01/21/2014  . Irritable bowel syndrome 08/30/2013  . 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 Decubitus ulcer of buttock 04/16/2013  . Chronic pain disorder 04/08/2013  . 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 OA (osteoarthritis) of knee 11/29/2011  . OA (osteoarthritis) of ankle 11/29/2011  . Depression, major, recurrent, moderate ([place]) 10/13/2011  . Neck pain 04/21/2011  . Occipital neuralgia 04/21/2011  . Esotropia 07/12/2010    Past Medical History:  Diagnosis Date  . Allergy   . Anemia   . Anxiety   . Arthritis   . Asthma   . Chronic migraine without aura without status migrainosus, not intractable 03/10/2015  . Chronic paraplegia ([place])   . Complication of anesthesia   . CP (cerebral palsy) ([place])   . CRPS (complex regional pain syndrome), upper limb 02/28/2015  . Depression   . Family history of adverse reaction to anesthesia    sister- severe n/v  . GERD (gastroesophageal reflux disease)   . H/O multiple concussions   . Headache   . Hearing loss  10/11/2016  . Neuromuscular disorder ([place])    CP  . PONV (postoperative nausea and vomiting)    Severe  . PTSD (post-traumatic stress disorder)   . UTI (urinary tract infection)    hospitalized    Past Surgical History:  Procedure Laterality Date  . achilles release Bilateral   . EYE SURGERY    . HAMSTRING RELEASE Bilateral   . HIP SURGERY Bilateral   . JOINT REPLACEMENT    . PHOTOCOAGULATION WITH LASER Bilateral 07/03/2017   Procedure: LASER DEMARCATION BILATERAL EYES;  Surgeon: Jalene Mullet, MD;  Location: [place];  Service: Ophthalmology;  Laterality: Bilateral;  . STRABISMUS SURGERY     2008, 1999    Social History   Socioeconomic History  . Marital status: Single    Spouse name: Not on file  . Number of children: Not on file  . Years of education: Not on file  . Highest education level: Not on file  Occupational History  . Not on file  Social Needs  . Financial resource strain: Not on file  . Food  insecurity:    Worry: Not on file    Inability: Not on file  . Transportation needs:    Medical: Not on file    Non-medical: Not on file  Tobacco Use  . Smoking status: Never Smoker  . Smokeless tobacco: Never Used  Substance and Sexual Activity  . Alcohol use: Not on file  . Drug use: No  . Sexual activity: Not on file  Lifestyle  . Physical activity:    Days per week: Not on file    Minutes per session: Not on file  . Stress: Not on file  Relationships  . Social connections:    Talks on phone: Not on file    Gets together: Not on file    Attends religious service: Not on file    Active member of club or organization: Not on file    Attends meetings of clubs or organizations: Not on file    Relationship status: Not on file  . Intimate partner violence:    Fear of current or ex partner: Not on file    Emotionally abused: Not on file    Physically abused: Not on file    Forced sexual activity: Not on file  Other Topics Concern  . Not on file  Social  History Narrative   Pt has doctorate degree    Family History  Problem Relation Age of Onset  . Cancer Mother        CLL, decsd in 2012  . Cerebral palsy Sister   . Breast cancer Neg Hx      Review of Systems  Constitutional: Negative for chills and fever.  HENT: Positive for congestion, ear pain and sinus pain. Negative for ear discharge and sore throat.   Eyes: Negative.  Negative for blurred vision, double vision, discharge and redness.  Respiratory: Negative for cough and shortness of breath.   Cardiovascular: Negative.  Negative for chest pain and palpitations.  Gastrointestinal: Negative.  Negative for abdominal pain, blood in stool, diarrhea, nausea and vomiting.  Skin: Negative for rash.  Neurological: Negative.  Negative for dizziness and headaches.  Endo/Heme/Allergies: Negative.   All other systems reviewed and are negative.   Vitals:   09/18/17 0838  BP: 139/89  Pulse: (!) 102  Resp: 18  Temp: 99.5 F (37.5 C)  SpO2: 97%    Physical Exam  Constitutional: She is oriented to person, place, and time. She appears well-developed and well-nourished.  Patient has a history of CP and is paraplegic, wheelchair-bound.  HENT:  Head: Normocephalic and atraumatic.  Right Ear: Tympanic membrane and external ear normal.  Left Ear: Tympanic membrane and external ear normal.  Tender right external ear canal with erythema.  Left external ear canal not as tender with less erythema.  Eyes: Pupils are equal, round, and reactive to light. Conjunctivae and EOM are normal.  Neck: Normal range of motion. Neck supple.  Cardiovascular: Normal rate, regular rhythm and normal heart sounds.  Pulmonary/Chest: Effort normal and breath sounds normal.  Lymphadenopathy:    She has no cervical adenopathy.  Neurological: She is alert and oriented to person, place, and time.  Skin: Skin is warm and dry. Capillary refill takes less than 2 seconds. No rash noted.  Psychiatric: She has a  normal mood and affect. Her behavior is normal.  Vitals reviewed.  A total of 25 minutes was spent in the room with the patient, greater than 50% of which was in counseling/coordination of care regarding differential diagnosis, treatment, medications,  and need for follow-up if no better or worse.   ASSESSMENT & PLAN: Dinita was seen today for ear pain.  Diagnoses and all orders for this visit:  Otalgia, bilateral -     neomycin-polymyxin-hydrocortisone (CORTISPORIN) 3.5-10000-1 OTIC suspension; Place 3 drops into both ears 4 (four) times daily.  Acute recurrent maxillary sinusitis -     sulfamethoxazole-trimethoprim (BACTRIM DS,SEPTRA DS) 800-160 MG tablet; Take 1 tablet by mouth 2 (two) times daily for 10 days.  Frontal sinus pain    Patient Instructions       IF you received an x-ray today, you will receive an invoice from [facility]. Please contact [facility] at [phone] with questions or concerns regarding your invoice.   IF you received labwork today, you will receive an invoice from [place]. Please contact [facility] at [phone] with questions or concerns regarding your invoice.   Our billing staff will not be able to assist you with questions regarding bills from these companies.  You will be contacted with the lab results as soon as they are available. The fastest way to get your results is to activate your My Chart account. Instructions are located on the last page of this paperwork. If you have not heard from [place] regarding the results in 2 weeks, please contact this office.      Earache, Adult An earache, or ear pain, can be caused by many things, including:  An infection.  Ear wax buildup.  Ear pressure.  Something in the ear that should not be there (foreign body).  A sore throat.  Tooth problems.  Jaw problems.  Treatment of the earache will depend on the cause. If the cause is not clear or cannot be determined, you may need  to watch your symptoms until your earache goes away or until a cause is found. Follow these instructions at home: Pay attention to any changes in your symptoms. Take these actions to help with your pain:  Take or apply over-the-counter and prescription medicines only as told by your health care provider.  If you were prescribed an antibiotic medicine, use it as told by your health care provider. Do not stop using the antibiotic even if you start to feel better.  Do not put anything in your ear other than medicine that is prescribed by your health care provider.  If directed, apply heat to the affected area as often as told by your health care provider. Use the heat source that your health care provider recommends, such as a moist heat pack or a heating pad. ? Place a towel between your skin and the heat source. ? Leave the heat on for 20-30 minutes. ? Remove the heat if your skin turns bright red. This is especially important if you are unable to feel pain, heat, or cold. You may have a greater risk of getting burned.  If directed, put ice on the ear: ? Put ice in a plastic bag. ? Place a towel between your skin and the bag. ? Leave the ice on for 20 minutes, 2-3 times a day.  Try resting in an upright position instead of lying down. This may help to reduce pressure in your ear and relieve pain.  Chew gum if it helps to relieve your ear pain.  Treat any allergies as told by your health care provider.  Keep all follow-up visits as told by your health care provider. This is important.  Contact a health care provider if:  Your pain does not improve  within 2 days.  Your earache gets worse.  You have new symptoms.  You have a fever. Get help right away if:  You have a severe headache.  You have a stiff neck.  You have trouble swallowing.  You have redness or swelling behind your ear.  You have fluid or blood coming from your ear.  You have hearing loss.  You feel  dizzy. This information is not intended to replace advice given to you by your health care provider. Make sure you discuss any questions you have with your health care provider. Document Released: 10/02/2003 Document Revised: 10/13/2015 Document Reviewed: 08/10/2015 Elsevier Interactive Patient Education  2018 [employer].      Agustina Caroli, MD [facility]

## 2017-10-02 DIAGNOSIS — D1801 Hemangioma of skin and subcutaneous tissue: Secondary | ICD-10-CM | POA: Diagnosis not present

## 2017-10-02 DIAGNOSIS — D485 Neoplasm of uncertain behavior of skin: Secondary | ICD-10-CM | POA: Diagnosis not present

## 2017-10-02 DIAGNOSIS — D229 Melanocytic nevi, unspecified: Secondary | ICD-10-CM | POA: Diagnosis not present

## 2017-10-02 DIAGNOSIS — D2261 Melanocytic nevi of right upper limb, including shoulder: Secondary | ICD-10-CM | POA: Diagnosis not present

## 2017-10-02 DIAGNOSIS — L814 Other melanin hyperpigmentation: Secondary | ICD-10-CM | POA: Diagnosis not present

## 2017-10-23 ENCOUNTER — Other Ambulatory Visit: Payer: Self-pay | Admitting: Gastroenterology

## 2017-10-23 DIAGNOSIS — R110 Nausea: Secondary | ICD-10-CM | POA: Diagnosis not present

## 2017-10-23 DIAGNOSIS — R1084 Generalized abdominal pain: Secondary | ICD-10-CM | POA: Diagnosis not present

## 2017-10-23 DIAGNOSIS — R197 Diarrhea, unspecified: Secondary | ICD-10-CM | POA: Diagnosis not present

## 2017-10-27 DIAGNOSIS — R1084 Generalized abdominal pain: Secondary | ICD-10-CM | POA: Diagnosis not present

## 2017-11-01 ENCOUNTER — Other Ambulatory Visit: Payer: Self-pay

## 2017-11-01 ENCOUNTER — Encounter: Payer: Self-pay | Admitting: Family Medicine

## 2017-11-01 ENCOUNTER — Ambulatory Visit (INDEPENDENT_AMBULATORY_CARE_PROVIDER_SITE_OTHER): Payer: BLUE CROSS/BLUE SHIELD | Admitting: Family Medicine

## 2017-11-01 VITALS — BP 112/74 | Temp 99.1°F

## 2017-11-01 DIAGNOSIS — R5381 Other malaise: Secondary | ICD-10-CM

## 2017-11-01 DIAGNOSIS — J029 Acute pharyngitis, unspecified: Secondary | ICD-10-CM

## 2017-11-01 DIAGNOSIS — J069 Acute upper respiratory infection, unspecified: Secondary | ICD-10-CM | POA: Diagnosis not present

## 2017-11-01 LAB — POC INFLUENZA A&B (BINAX/QUICKVUE)
Influenza A, POC: NEGATIVE
Influenza B, POC: NEGATIVE

## 2017-11-01 LAB — POCT RAPID STREP A (OFFICE): Rapid Strep A Screen: NEGATIVE

## 2017-11-01 MED ORDER — ALBUTEROL SULFATE HFA 108 (90 BASE) MCG/ACT IN AERS: INHALATION_SPRAY | RESPIRATORY_TRACT | 5 refills | Status: DC

## 2017-11-01 MED ORDER — BENZONATATE 100 MG PO CAPS: 100.0000 mg | ORAL_CAPSULE | Freq: Three times a day (TID) | ORAL | 0 refills | Status: DC | PRN

## 2017-11-01 MED ORDER — LEVOCETIRIZINE DIHYDROCHLORIDE 5 MG PO TABS: 5.0000 mg | ORAL_TABLET | Freq: Every evening | ORAL | 5 refills | Status: DC

## 2017-11-01 MED ORDER — FLUTICASONE-SALMETEROL 250-50 MCG/DOSE IN AEPB: 1.0000 | INHALATION_SPRAY | Freq: Two times a day (BID) | RESPIRATORY_TRACT | 4 refills | Status: AC

## 2017-11-01 NOTE — Patient Instructions (Signed)
° ° ° °  If you have lab work done today you will be contacted with your lab results within the next 2 weeks.  If you have not heard from us then please contact us. The fastest way to get your results is to register for My Chart. ° ° °IF you received an x-ray today, you will receive an invoice from [facility]. Please contact [facility] at [phone] with questions or concerns regarding your invoice.  ° °IF you received labwork today, you will receive an invoice from [facility]. Please contact [facility] at [phone] with questions or concerns regarding your invoice.  ° °Our billing staff will not be able to assist you with questions regarding bills from these companies. ° °You will be contacted with the lab results as soon as they are available. The fastest way to get your results is to activate your My Chart account. Instructions are located on the last page of this paperwork. If you have not heard from us regarding the results in 2 weeks, please contact this office. °  ° ° ° °

## 2017-11-01 NOTE — Progress Notes (Signed)
9/4/201912:08 PM  Carolyn Pena [birth year], 42 y.o. female [id number]  Chief Complaint  Patient presents with  . Sore Throat    possible strep throat    HPI:   Patient is a 42 y.o. female who presents today for sore throat  Has been feeling very lethargic and malaise Sore throat for past 5 days + contact with strep infection Has had flu vaccine No fever or chills No ear pain or sinus pain + coughing, hurts to swallow Allergies are doing ok, takes meds as prescribed Asthma has been acting up a bit, but she has been without meds for past several days, requesting refills Would also like to be tested for the flu as she works with lots of people  Fall Risk  08/05/2017 04/22/2017 04/08/2017 12/03/2016 12/03/2016  Falls in the past year? No No No Yes No  Number falls in past yr: - - - 2 or more -  Injury with Fall? - - - No -  Comment - - - - -     Depression screen [facility] 2/9 09/18/2017 08/05/2017 04/22/2017  Decreased Interest 0 2 0  Down, Depressed, Hopeless 0 0 0  PHQ - 2 Score 0 2 0  Altered sleeping - 2 -  Tired, decreased energy - 3 -  Change in appetite - 3 -  Feeling bad or failure about yourself  - 0 -  Trouble concentrating - 0 -  Moving slowly or fidgety/restless - 0 -  Suicidal thoughts - 0 -  PHQ-9 Score - 10 -    Allergies  Allergen Reactions  . Lidocaine Swelling    Had steroid injection doesn't know if it was the steroid or lidocaine. "Whole arm swelled & warm to touch. Severe inflamation."   . Augmentin [Amoxicillin-Pot Clavulanate] Diarrhea and Nausea And Vomiting    Has patient had a PCN reaction causing immediate rash, facial/tongue/throat swelling, SOB or lightheadedness with hypotension: No Has patient had a PCN reaction causing severe rash involving mucus membranes or skin necrosis: No Has patient had a PCN reaction that required hospitalization: No Has patient had a PCN reaction occurring within the last 10 years: #  #  #  YES  #  #  #  If all of the  above answers are "NO", then may proceed with Cephalosporin use.   . Cefdinir Diarrhea  . Codeine Nausea And Vomiting  . Lortab [Hydrocodone-Acetaminophen] Nausea And Vomiting  . Methylprednisolone Rash    Had lidocaine as well  . Other Nausea And Vomiting    general anesthesia causes severe nausea and vomiting     Prior to Admission medications   Medication Sig Start Date End Date Taking? Authorizing Provider  albuterol (PROVENTIL HFA) 108 (90 Base) MCG/ACT inhaler INL 2 PFS PO INTO THE LUNGS Q 6 H PRF WHZ OR SOB 11/14/16  Yes [provider]  albuterol (PROVENTIL) (2.5 MG/3ML) 0.083% nebulizer solution Inhale into the lungs. 05/08/15  Yes [provider]  azelastine (ASTELIN) 0.1 % nasal spray Place 2 sprays into the nose 2 (two) times daily. 12/30/16 12/30/17 Yes [provider]  busPIRone (BUSPAR) 5 MG tablet TAKE 1 TABLET BY MOUTH TWICE DAILY 03/20/17  Yes [provider]  butalbital-acetaminophen-caffeine (FIORICET, ESGIC) 50-325-40 MG tablet Take 1 tablet by mouth every 4 (four) hours as needed for headache. 07/28/17  Yes Shawnee Knapp, MD  Control Gel Formula Dressing (DUODERM CGF DRESSING) MISC Use as directed multiple times daily for gluteal pressure ulcer wound care  08/05/17  Yes Shawnee Knapp, MD  diclofenac (VOLTAREN) 50 MG EC tablet Take one tablet by mouth two times daily with food. 02/02/15  Yes [provider]  diphenoxylate-atropine (LOMOTIL) 2.5-0.025 MG tablet TAKE 1 TABLET BY MOUTH 4 TIMES DAILY FOR DIARRHEA 08/07/17  Yes Shawnee Knapp, MD  EPINEPHrine [facility]) 0.15 MG/0.3ML injection Inject as directed as needed.   Yes [provider]  etonogestrel (NEXPLANON) 68 MG IMPL implant Inject into the skin.   Yes [provider]  Fluticasone-Salmeterol (ADVAIR DISKUS) 250-50 MCG/DOSE AEPB Inhale 1 puff into the lungs 2 (two) times daily. 12/30/16  Yes [provider]  gabapentin (NEURONTIN) 300 MG capsule Take 600 mg by  mouth 3 (three) times daily. 07/25/17  Yes [provider]  hepatitis B vaccine (ENGERIX-B) 20 MCG/ML injection ADM 1ML IM UTD 07/03/14  Yes [provider]  Hydroactive Dressings (DUODERM HYDROACTIVE) GEL Use as directed multiple times daily for gluteal pressure ulcer wound care 08/05/17  Yes Shawnee Knapp, MD  ibuprofen (ADVIL,MOTRIN) 800 MG tablet Take 800 mg by mouth every 8 (eight) hours as needed. for pain 10/15/17  Yes [provider]  ketorolac (TORADOL) 60 MG/2ML SOLN injection 30 mg IM q 8 hours as needed for pain, max 5 days consecutive use 02/02/15  Yes [provider]  levocetirizine (XYZAL) 5 MG tablet Take 5 mg by mouth every evening. 07/25/17  Yes [provider]  levocetirizine (XYZAL) 5 MG tablet TK 1 T PO QD 07/14/14  Yes [provider]  lidocaine (XYLOCAINE) 5 % ointment Apply thinly tid prn 07/31/14  Yes [provider]  Multiple Vitamin (THERA) TABS Take by mouth.   Yes [provider]  mupirocin ointment (BACTROBAN) 2 % Apply 1 application topically 3 (three) times daily as needed. 07/25/17  Yes [provider]  mupirocin ointment (BACTROBAN) 2 % Apply to affected area 3 times daily 11/14/16 11/14/17 Yes [provider]  neomycin-bacitracin-polymyxin (NEOSPORIN) ointment Apply 1 application topically as needed for wound care.   Yes [provider]  neomycin-polymyxin-hydrocortisone (CORTISPORIN) 3.5-10000-1 OTIC suspension Place 3 drops into both ears 4 (four) times daily. 09/18/17  Yes Sagardia, Ines Bloomer, MD  nystatin cream (MYCOSTATIN) Apply a thin layer twice a day below breasts for rash 05/14/14  Yes [provider]  potassium chloride SA (K-DUR,KLOR-CON) 20 MEQ tablet Take by mouth. 12/30/16  Yes [provider]  promethazine (PHENERGAN) 25 MG tablet Take 1 tablet (25 mg total) by mouth every 8 (eight) hours as needed for nausea or vomiting. 08/05/17  Yes Shawnee Knapp, MD    promethazine-dextromethorphan (PROMETHAZINE-DM) 6.25-15 MG/5ML syrup TK 5 ML PO QID PRF COUGH 06/18/15  Yes [provider]  rizatriptan (MAXALT) 5 MG tablet 1 at migraine onset and repeat after 2 hours if needed. 02/02/15  Yes [provider]  rizatriptan (MAXALT) 5 MG tablet TAKE ONE TABLET BY MOUTH AS NEEDED FOR MIGRAINE. MAY REPEAT IN 2 HOURS IF NEEDED 09/07/17  Yes [provider]  sertraline (ZOLOFT) 100 MG tablet Take 2 tablets (200 mg total) by mouth daily. 08/14/17  Yes Shawnee Knapp, MD  sertraline (ZOLOFT) 100 MG tablet TAKE 2 TABLETS BY MOUTH DAILY 03/20/17  Yes [provider]  sodium fluoride (FLUORISHIELD) 1.1 % GEL dental gel APP A PEA-SIZED AMOUNT TO TOOTHBRUSH AND BRUTH TEETH THROUGHLY FOR 2 MINUTES DAILY 04/04/16  Yes [provider]  traZODone (DESYREL) 50 MG tablet Take three at bedtime for sleep.  10/08/14  Yes [provider]  Vitamin D, Ergocalciferol, (DRISDOL) 50000 units CAPS capsule Take by mouth. 08/05/15  Yes [provider]  Zinc Oxide (SECURA EX) Apply 1 application topically daily as needed (rash).   Yes [provider]    Past Medical History:  Diagnosis Date  . Allergy   . Anemia   . Anxiety   . Arthritis   . Asthma   . Chronic migraine without aura without status migrainosus, not intractable 03/10/2015  . Chronic paraplegia ([place])   . Complication of anesthesia   . CP (cerebral palsy) ([place])   . CRPS (complex regional pain syndrome), upper limb 02/28/2015  . Depression   . Family history of adverse reaction to anesthesia    sister- severe n/v  . GERD (gastroesophageal reflux disease)   . H/O multiple concussions   . Headache   . Hearing loss 10/11/2016  . Neuromuscular disorder ([place])    CP  . PONV (postoperative nausea and vomiting)    Severe  . PTSD (post-traumatic stress disorder)   . UTI (urinary tract infection)    hospitalized    Past Surgical History:  Procedure Laterality Date   . achilles release Bilateral   . EYE SURGERY    . HAMSTRING RELEASE Bilateral   . HIP SURGERY Bilateral   . JOINT REPLACEMENT    . PHOTOCOAGULATION WITH LASER Bilateral 07/03/2017   Procedure: LASER DEMARCATION BILATERAL EYES;  Surgeon: Jalene Mullet, MD;  Location: [place];  Service: Ophthalmology;  Laterality: Bilateral;  . STRABISMUS SURGERY     2008, 1999    Social History   Tobacco Use  . Smoking status: Never Smoker  . Smokeless tobacco: Never Used  Substance Use Topics  . Alcohol use: Not on file    Family History  Problem Relation Age of Onset  . Cancer Mother        CLL, decsd in 2012  . Cerebral palsy Sister   . Breast cancer Neg Hx     ROS Per hpi  OBJECTIVE:  Blood pressure 112/74, temperature 99.1 F (37.3 C), temperature source Oral, last menstrual period 10/05/2017. There is no height or weight on file to calculate BMI.   Physical Exam  Constitutional: She is oriented to person, place, and time. She appears well-developed and well-nourished.  Wheelchair bound  HENT:  Head: Normocephalic and atraumatic.  Right Ear: Hearing, tympanic membrane, external ear and ear canal normal.  Left Ear: Hearing, tympanic membrane, external ear and ear canal normal.  Mouth/Throat: Oropharynx is clear and moist.  Eyes: Pupils are equal, round, and reactive to light. Conjunctivae and EOM are normal.  Neck: Neck supple.  Cardiovascular: Normal rate, regular rhythm and normal heart sounds. Exam reveals no gallop and no friction rub.  No murmur heard. Pulmonary/Chest: Effort normal and breath sounds normal. She has no wheezes. She has no rales.  Musculoskeletal: She exhibits no edema.  Lymphadenopathy:    She has no cervical adenopathy.  Neurological: She is alert and oriented to person, place, and time.  Skin: Skin is warm and dry.  Psychiatric: She has a normal mood and affect.  Nursing note and vitals reviewed.   Results for orders placed or performed in visit on  11/01/17 (from the past 24 hour(s))  POCT rapid strep A     Status: None   Collection Time: 11/01/17 12:19 PM  Result Value Ref Range   Rapid Strep A Screen Negative Negative  POC Influenza A&B(BINAX/QUICKVUE)  Status: None   Collection Time: 11/01/17 12:35 PM  Result Value Ref Range   Influenza A, POC Negative Negative   Influenza B, POC Negative Negative    ASSESSMENT and PLAN  1. Acute upper respiratory infection Discussed supportive measures for URI: increase hydration, rest, OTC medications, etc. RTC precautions discussed.  2. Sore throat - POCT rapid strep A  3. Malaise - POC Influenza A&B(BINAX/QUICKVUE)   Return if symptoms worsen or fail to improve.    Rutherford Guys, MD [facility] [place], [place] [address] Ph.  [phone] Fax [phone]

## 2017-11-02 ENCOUNTER — Telehealth: Payer: Self-pay

## 2017-11-02 MED ORDER — NYSTATIN 100000 UNIT/ML MT SUSP: 5.0000 mL | Freq: Four times a day (QID) | OROMUCOSAL | 0 refills | Status: DC

## 2017-11-02 MED ORDER — ALBUTEROL SULFATE HFA 108 (90 BASE) MCG/ACT IN AERS: INHALATION_SPRAY | RESPIRATORY_TRACT | 5 refills | Status: DC

## 2017-11-02 NOTE — Telephone Encounter (Signed)
Copied from [place] [id number]. Topic: General - Other >> Nov 02, 2017 11:42 AM Carolyn Stare wrote:  Pt is asking if something else can be RX for her, the med is requiring a PA albuterol (PROVENTIL HFA) 108 (90 Base) MCG/ACT inhaler  She is also asking for Magic mouthwash for a sore throat   Pharmacy  [facility]  Message sent to Dr. Pamella Pert for [place] And message sent to [facility] for PA.

## 2017-11-02 NOTE — Telephone Encounter (Signed)
Albuterol refilled Sent nystatin swish and swallow as she had thrush and no ulcers

## 2017-11-02 NOTE — Addendum Note (Signed)
Addended by: Rutherford Guys on: 11/02/2017 12:22 PM   Modules accepted: Orders

## 2017-11-08 ENCOUNTER — Telehealth: Payer: Self-pay | Admitting: Family Medicine

## 2017-11-08 ENCOUNTER — Ambulatory Visit
Admission: RE | Admit: 2017-11-08 | Discharge: 2017-11-08 | Disposition: A | Discharge status: Home / Self Care | Payer: BLUE CROSS/BLUE SHIELD | Source: Ambulatory Visit | Attending: Family Medicine | Admitting: Family Medicine

## 2017-11-08 ENCOUNTER — Ambulatory Visit
Admission: RE | Admit: 2017-11-08 | Discharge: 2017-11-08 | Disposition: A | Discharge status: Home / Self Care | Payer: BLUE CROSS/BLUE SHIELD | Source: Ambulatory Visit | Attending: Gastroenterology | Admitting: Gastroenterology

## 2017-11-08 DIAGNOSIS — Z9889 Other specified postprocedural states: Secondary | ICD-10-CM | POA: Diagnosis not present

## 2017-11-08 DIAGNOSIS — S99912A Unspecified injury of left ankle, initial encounter: Secondary | ICD-10-CM | POA: Diagnosis not present

## 2017-11-08 DIAGNOSIS — H5032 Intermittent alternating esotropia: Secondary | ICD-10-CM | POA: Diagnosis not present

## 2017-11-08 DIAGNOSIS — M25572 Pain in left ankle and joints of left foot: Secondary | ICD-10-CM

## 2017-11-08 DIAGNOSIS — R1084 Generalized abdominal pain: Secondary | ICD-10-CM

## 2017-11-08 DIAGNOSIS — K8020 Calculus of gallbladder without cholecystitis without obstruction: Secondary | ICD-10-CM | POA: Diagnosis not present

## 2017-11-08 DIAGNOSIS — G809 Cerebral palsy, unspecified: Secondary | ICD-10-CM | POA: Diagnosis not present

## 2017-11-08 DIAGNOSIS — H4423 Degenerative myopia, bilateral: Secondary | ICD-10-CM | POA: Diagnosis not present

## 2017-11-08 MED ORDER — IOPAMIDOL (ISOVUE-300) INJECTION 61%
100.0000 mL | Freq: Once | INTRAVENOUS | Status: AC | PRN
  Administered 2017-11-08: 100 mL via INTRAVENOUS

## 2017-11-08 NOTE — Telephone Encounter (Signed)
Message sent in error

## 2017-11-17 ENCOUNTER — Telehealth: Payer: Self-pay | Admitting: Family Medicine

## 2017-11-17 NOTE — Telephone Encounter (Signed)
Rx refill request: clonazepam 1 mg    Last filled:

## 2017-11-17 NOTE — Telephone Encounter (Signed)
Rx refill request- clonazepam 1 mg     Attempted to call patient- left message to call back Message states patient used this PRN- has been discontinued on medication list.  LOV: 11/01/17  PCP: [place]: verified

## 2017-11-17 NOTE — Telephone Encounter (Signed)
Copied from [place] [id number]. Topic: Quick Communication - Rx Refill/Question >> Nov 17, 2017  1:41 PM Leward Quan A wrote: Medication: clonazePAM (KLONOPIN) 1 MG tablet   Patient was informed that per med list this med had been discontinued. She stated that she has been on this medication for five yrs PRN. Please advise  Has the patient contacted their pharmacy?  Yes   Preferred Pharmacy (with phone number or street name): [place], [place] [phone] (Phone) [phone] (Fax)    Agent: Please be advised that RX refills may take up to 3 business days. We ask that you follow-up with your pharmacy.

## 2017-11-22 NOTE — Telephone Encounter (Signed)
Left message for patient

## 2017-11-23 NOTE — Telephone Encounter (Signed)
Sent refill req to Dr. Brigitte Pulse for Clonazepam 37m.   Original rx was for #60 with 5 refills.

## 2017-11-24 ENCOUNTER — Encounter: Payer: Self-pay | Admitting: Family Medicine

## 2017-11-24 ENCOUNTER — Ambulatory Visit (INDEPENDENT_AMBULATORY_CARE_PROVIDER_SITE_OTHER): Payer: BLUE CROSS/BLUE SHIELD | Admitting: Family Medicine

## 2017-11-24 ENCOUNTER — Other Ambulatory Visit: Payer: Self-pay

## 2017-11-24 VITALS — BP 120/89 | HR 84 | Temp 99.0°F | Resp 18

## 2017-11-24 DIAGNOSIS — R300 Dysuria: Secondary | ICD-10-CM

## 2017-11-24 DIAGNOSIS — N898 Other specified noninflammatory disorders of vagina: Secondary | ICD-10-CM

## 2017-11-24 DIAGNOSIS — L739 Follicular disorder, unspecified: Secondary | ICD-10-CM

## 2017-11-24 LAB — POCT URINALYSIS DIP (MANUAL ENTRY)
Bilirubin, UA: NEGATIVE
Glucose, UA: NEGATIVE mg/dL
Ketones, POC UA: NEGATIVE mg/dL
Leukocytes, UA: NEGATIVE
Nitrite, UA: NEGATIVE
Protein Ur, POC: NEGATIVE mg/dL
Spec Grav, UA: 1.02 (ref 1.010–1.025)
Urobilinogen, UA: 0.2 E.U./dL
pH, UA: 6 (ref 5.0–8.0)

## 2017-11-24 LAB — POCT WET + KOH PREP
Trich by wet prep: ABSENT
Yeast by KOH: ABSENT
Yeast by wet prep: ABSENT

## 2017-11-24 LAB — POC MICROSCOPIC URINALYSIS (UMFC): Mucus: ABSENT

## 2017-11-24 MED ORDER — TRIAMCINOLONE ACETONIDE 0.1 % EX CREA: 1.0000 "application " | TOPICAL_CREAM | Freq: Two times a day (BID) | CUTANEOUS | 0 refills | Status: DC

## 2017-11-24 MED ORDER — CLONAZEPAM 1 MG PO TABS: 1.0000 mg | ORAL_TABLET | Freq: Two times a day (BID) | ORAL | 1 refills | Status: DC | PRN

## 2017-11-24 MED ORDER — MUPIROCIN 2 % EX OINT: 1.0000 "application " | TOPICAL_OINTMENT | Freq: Three times a day (TID) | CUTANEOUS | 1 refills | Status: DC

## 2017-11-24 MED ORDER — FLUCONAZOLE 150 MG PO TABS: 150.0000 mg | ORAL_TABLET | Freq: Once | ORAL | 0 refills | Status: AC

## 2017-11-24 NOTE — Progress Notes (Signed)
9/27/201911:37 AM  Carolyn Pena November 10, 1975, 42 y.o. female [id number]  Chief Complaint  Patient presents with  . Dysuria    X 1 1/2 week- frequency to urinate,  infected ingrown hair on perineal area  . Back Pain    X 2 weeks-     HPI:   Patient is a 42 y.o. female with past medical history significant for wheelchair bound who presents today for dysuria  Dysuria for about 1 1/2 weeks, constant burning with urination and low back pain, + urgency and frequency Uses depends, wheelchair bound This similar to previous UTI/kidney infections + fever, chills, night sweats + malaise Has been taking ibuprofen and APAP Does not drink enough water Azo did not help Last urine cx - k. 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 No No No Yes No  Number falls in past yr: - - - 2 or more -  Injury with Fall? - - - No -  Comment - - - - -     Depression screen [facility] 2/9 09/18/2017 08/05/2017 04/22/2017  Decreased Interest 0 2 0  Down, Depressed, Hopeless 0 0 0  PHQ - 2 Score 0 2 0  Altered sleeping - 2 -  Tired, decreased energy - 3 -  Change in appetite - 3 -  Feeling bad or failure about yourself  - 0 -  Trouble concentrating - 0 -  Moving slowly or fidgety/restless - 0 -  Suicidal thoughts - 0 -  PHQ-9 Score - 10 -    Allergies  Allergen Reactions  . Lidocaine Swelling    Had steroid injection doesn't know if it was the steroid or lidocaine. "Whole arm swelled & warm to touch. Severe inflamation."   . Augmentin [Amoxicillin-Pot Clavulanate] Diarrhea and Nausea And Vomiting    Has patient had a PCN reaction causing immediate rash, facial/tongue/throat swelling, SOB or lightheadedness with hypotension: No Has patient had a PCN reaction causing severe rash involving mucus membranes or skin necrosis: No Has patient had a PCN reaction that  required hospitalization: No Has patient had a PCN reaction occurring within the last 10 years: #  #  #  YES  #  #  #  If all of the above answers are "NO", then may proceed with Cephalosporin use.   . Cefdinir Diarrhea  . Codeine Nausea And Vomiting  . Lortab [Hydrocodone-Acetaminophen] Nausea And Vomiting  . Methylprednisolone Rash    Had lidocaine as well  . Other Nausea And Vomiting    general anesthesia causes severe nausea and vomiting     Prior to Admission medications   Medication Sig Start Date End Date Taking? Authorizing Provider  albuterol (PROVENTIL HFA) 108 (90 Base) MCG/ACT inhaler INL 2 PFS PO INTO THE LUNGS Q 6 H PRF WHZ OR SOB 11/02/17  Yes Rutherford Guys, MD  albuterol (PROVENTIL) (2.5 MG/3ML) 0.083% nebulizer solution Inhale into the lungs. 05/08/15  Yes [provider]  azelastine (ASTELIN) 0.1 % nasal spray Place 2 sprays into the nose 2 (two) times daily. 12/30/16 12/30/17 Yes [provider]  benzonatate (TESSALON) 100 MG capsule Take 1-2 capsules (100-200 mg total) by mouth 3 (three) times daily as needed for cough. 11/01/17  Yes Rutherford Guys, MD  busPIRone (BUSPAR) 5 MG tablet TAKE 1 TABLET BY MOUTH TWICE DAILY 03/20/17  Yes [provider]  butalbital-acetaminophen-caffeine (FIORICET, ESGIC) 50-325-40 MG tablet Take 1 tablet by mouth every 4 (four) hours as needed for headache. 07/28/17  Yes Shawnee Knapp, MD  Control Gel Formula Dressing (DUODERM CGF DRESSING) MISC Use as directed multiple times daily for gluteal pressure ulcer wound care 08/05/17  Yes Shawnee Knapp, MD  diclofenac (VOLTAREN) 50 MG EC tablet Take one tablet by mouth two times daily with food. 02/02/15  Yes [provider]  diphenoxylate-atropine (LOMOTIL) 2.5-0.025 MG tablet TAKE 1 TABLET BY MOUTH 4 TIMES DAILY FOR DIARRHEA 08/07/17  Yes Shawnee Knapp, MD  EPINEPHrine [facility]) 0.15 MG/0.3ML injection Inject as directed as needed.   Yes [provider]    etonogestrel (NEXPLANON) 68 MG IMPL implant Inject into the skin.   Yes [provider]  Fluticasone-Salmeterol (ADVAIR DISKUS) 250-50 MCG/DOSE AEPB Inhale 1 puff into the lungs 2 (two) times daily. 11/01/17  Yes Rutherford Guys, MD  gabapentin (NEURONTIN) 300 MG capsule Take 600 mg by mouth 3 (three) times daily. 07/25/17  Yes [provider]  hepatitis B vaccine (ENGERIX-B) 20 MCG/ML injection ADM 1ML IM UTD 07/03/14  Yes [provider]  Hydroactive Dressings (DUODERM HYDROACTIVE) GEL Use as directed multiple times daily for gluteal pressure ulcer wound care 08/05/17  Yes Shawnee Knapp, MD  ibuprofen (ADVIL,MOTRIN) 800 MG tablet Take 800 mg by mouth every 8 (eight) hours as needed. for pain 10/15/17  Yes [provider]  ketorolac (TORADOL) 60 MG/2ML SOLN injection 30 mg IM q 8 hours as needed for pain, max 5 days consecutive use 02/02/15  Yes [provider]  levocetirizine (XYZAL) 5 MG tablet TK 1 T PO QD 07/14/14  Yes [provider]  levocetirizine (XYZAL) 5 MG tablet Take 1 tablet (5 mg total) by mouth every evening. 11/01/17  Yes Rutherford Guys, MD  lidocaine (XYLOCAINE) 5 % ointment Apply thinly tid prn 07/31/14  Yes [provider]  Multiple Vitamin (THERA) TABS Take by mouth.   Yes [provider]  mupirocin ointment (BACTROBAN) 2 % Apply 1 application topically 3 (three) times daily as needed. 07/25/17  Yes [provider]  neomycin-polymyxin-hydrocortisone (CORTISPORIN) 3.5-10000-1 OTIC suspension Place 3 drops into both ears 4 (four) times daily. 09/18/17  Yes Sagardia, Ines Bloomer, MD  nystatin (MYCOSTATIN) 100000 UNIT/ML suspension Take 5 mLs (500,000 Units total) by mouth 4 (four) times daily. 11/02/17  Yes Rutherford Guys, MD  nystatin cream (MYCOSTATIN) Apply a thin layer twice a day below breasts for rash 05/14/14  Yes [provider]  potassium chloride SA (K-DUR,KLOR-CON) 20 MEQ tablet Take by mouth.  12/30/16  Yes [provider]  promethazine (PHENERGAN) 25 MG tablet Take 1 tablet (25 mg total) by mouth every 8 (eight) hours as needed for nausea or vomiting. 08/05/17  Yes Shawnee Knapp, MD  promethazine-dextromethorphan (PROMETHAZINE-DM) 6.25-15 MG/5ML syrup TK 5 ML PO QID PRF COUGH 06/18/15  Yes [provider]  rizatriptan (MAXALT) 5 MG tablet 1 at migraine onset and repeat after 2 hours if needed. 02/02/15  Yes [provider]  rizatriptan (MAXALT) 5 MG tablet TAKE ONE TABLET BY MOUTH AS NEEDED FOR MIGRAINE. MAY REPEAT IN 2 HOURS IF NEEDED 09/07/17  Yes [provider]  sertraline (ZOLOFT) 100 MG tablet Take 2 tablets (200 mg total) by mouth daily. 08/14/17  Yes Shawnee Knapp, MD  sertraline (ZOLOFT) 100 MG tablet TAKE 2 TABLETS BY MOUTH DAILY 03/20/17  Yes [provider]  sodium fluoride (FLUORISHIELD) 1.1 % GEL dental gel APP A PEA-SIZED AMOUNT TO TOOTHBRUSH AND BRUTH TEETH THROUGHLY FOR 2 MINUTES DAILY 04/04/16  Yes [provider]  traZODone (DESYREL) 50 MG tablet Take three at bedtime for sleep. 10/08/14  Yes [provider]  Vitamin D, Ergocalciferol, (DRISDOL) 50000 units CAPS capsule Take by mouth. 08/05/15  Yes [provider]  Zinc Oxide (SECURA EX) Apply 1 application topically daily as needed (rash).   Yes [provider]  neomycin-bacitracin-polymyxin (NEOSPORIN) ointment Apply 1 application topically as needed for wound care.    [provider]    Past Medical History:  Diagnosis Date  . Allergy   . Anemia   . Anxiety   . Arthritis   . Asthma   . Chronic migraine without aura without status migrainosus, not intractable 03/10/2015  . Chronic paraplegia ([place])   . Complication of anesthesia   . CP (cerebral palsy) ([place])   . CRPS (complex regional pain syndrome), upper limb 02/28/2015  . Depression   . Family history of adverse reaction to anesthesia    sister- severe n/v  . GERD (gastroesophageal  reflux disease)   . H/O multiple concussions   . Headache   . Hearing loss 10/11/2016  . Neuromuscular disorder ([place])    CP  . PONV (postoperative nausea and vomiting)    Severe  . PTSD (post-traumatic stress disorder)   . UTI (urinary tract infection)    hospitalized    Past Surgical History:  Procedure Laterality Date  . achilles release Bilateral   . EYE SURGERY    . HAMSTRING RELEASE Bilateral   . HIP SURGERY Bilateral   . JOINT REPLACEMENT    . PHOTOCOAGULATION WITH LASER Bilateral 07/03/2017   Procedure: LASER DEMARCATION BILATERAL EYES;  Surgeon: Jalene Mullet, MD;  Location: [facility];  Service: Ophthalmology;  Laterality: Bilateral;  . STRABISMUS SURGERY     2008, 1999    Social History   Tobacco Use  . Smoking status: Never Smoker  . Smokeless tobacco: Never Used  Substance Use Topics  . Alcohol use: Yes    Alcohol/week: 0.0 standard drinks    Comment: occ    Family History  Problem Relation Age of Onset  . Cancer Mother        CLL, decsd in 2012  . Cerebral palsy Sister   . Breast cancer Neg Hx     ROS Per hpi  OBJECTIVE:  Blood pressure 120/89, pulse 84, temperature 99 F (37.2 C), temperature source Oral, resp. rate 18, last menstrual period 11/02/2017, SpO2 96 %. There is no height or weight on file to calculate BMI.   Physical Exam  Constitutional: She is oriented to person, place, and time. She appears well-developed and well-nourished.  HENT:  Head: Normocephalic and atraumatic.  Mouth/Throat: Oropharynx is clear and moist. No oropharyngeal exudate.  Eyes: Pupils are equal, round, and reactive to light. Conjunctivae and EOM are normal. No scleral icterus.  Neck: Neck supple.  Cardiovascular: Normal rate, regular rhythm and normal heart sounds. Exam reveals no gallop and no friction rub.  No murmur heard. Pulmonary/Chest: Effort normal and breath sounds normal. She has no wheezes. She has no rales.  Abdominal: Soft. Bowel sounds are normal.  There is tenderness.  Genitourinary: There is rash and tenderness on the right labia. There is rash and tenderness on the left labia. There is erythema in the vagina. Vaginal discharge (white chunky scant) found.  Musculoskeletal: She exhibits no edema.  Neurological:  She is alert and oriented to person, place, and time.  Skin: Skin is warm and dry.  Psychiatric: She has a normal mood and affect.  Nursing note and vitals reviewed.   Results for orders placed or performed in visit on 11/24/17 (from the past 24 hour(s))  POCT urinalysis dipstick     Status: Abnormal   Collection Time: 11/24/17 11:39 AM  Result Value Ref Range   Color, UA yellow yellow   Clarity, UA clear clear   Glucose, UA negative negative mg/dL   Bilirubin, UA negative negative   Ketones, POC UA negative negative mg/dL   Spec Grav, UA 1.020 1.010 - 1.025   Blood, UA small (A) negative   pH, UA 6.0 5.0 - 8.0   Protein Ur, POC negative negative mg/dL   Urobilinogen, UA 0.2 0.2 or 1.0 E.U./dL   Nitrite, UA Negative Negative   Leukocytes, UA Negative Negative  POCT Microscopic Urinalysis ([facility])     Status: Abnormal   Collection Time: 11/24/17 11:52 AM  Result Value Ref Range   WBC,UR,HPF,POC None None WBC/hpf   RBC,UR,HPF,POC Few (A) None RBC/hpf   Bacteria None None, Too numerous to count   Mucus Absent Absent   Epithelial Cells, UR Per Microscopy Moderate (A) None, Too numerous to count cells/hpf  POCT Wet + KOH Prep     Status: Abnormal   Collection Time: 11/24/17 12:45 PM  Result Value Ref Range   Yeast by KOH Absent Absent   Yeast by wet prep Absent Absent   WBC by wet prep Moderate (A) Few   Clue Cells Wet Prep HPF POC Few (A) None   Trich by wet prep Absent Absent   Bacteria Wet Prep HPF POC Few Few   Epithelial Cells By [employer] Pref ([facility]) Many (A) None, Few, Too numerous to count   RBC,UR,HPF,POC Few (A) None RBC/hpf    ASSESSMENT and PLAN  1. Dysuria Bland ua, difficult wet prep to collect.  Treating empirically for yeast. Sending urine cx given h.o recurrent UTI and risk factors. Discussed avoidance of irritants. Short course of topical steroids to help with irritation, unable to do stiz bathes.  - POCT urinalysis dipstick - POCT Microscopic Urinalysis ([facility]) - Urine Culture - POCT Wet + KOH Prep  2. Vaginal irritation - POCT Wet + KOH Prep  3. Folliculitis Minor suprapubic folliculitis. Topical bactroban  Refilled clonazepam.   Other orders - fluconazole (DIFLUCAN) 150 MG tablet; Take 1 tablet (150 mg total) by mouth once for 1 dose. Repeat in 3 days - mupirocin ointment (BACTROBAN) 2 %; Apply 1 application topically 3 (three) times daily. - triamcinolone cream (KENALOG) 0.1 %; Apply 1 application topically 2 (two) times daily. - clonazePAM (KLONOPIN) 1 MG tablet; Take 1 tablet (1 mg total) by mouth 2 (two) times daily as needed for anxiety.    Return if symptoms worsen or fail to improve.    Rutherford Guys, MD [facility] at [facility] [place], [place] [address] Ph.  [phone] Fax [phone]

## 2017-11-24 NOTE — Patient Instructions (Signed)
° ° ° °  If you have lab work done today you will be contacted with your lab results within the next 2 weeks.  If you have not heard from us then please contact us. The fastest way to get your results is to register for My Chart. ° ° °IF you received an x-ray today, you will receive an invoice from [facility]. Please contact [facility] at [phone] with questions or concerns regarding your invoice.  ° °IF you received labwork today, you will receive an invoice from [facility]. Please contact [facility] at [phone] with questions or concerns regarding your invoice.  ° °Our billing staff will not be able to assist you with questions regarding bills from these companies. ° °You will be contacted with the lab results as soon as they are available. The fastest way to get your results is to activate your My Chart account. Instructions are located on the last page of this paperwork. If you have not heard from us regarding the results in 2 weeks, please contact this office. °  ° ° ° °

## 2017-11-25 LAB — URINE CULTURE

## 2017-11-30 ENCOUNTER — Encounter: Payer: Self-pay | Admitting: Family Medicine

## 2017-11-30 ENCOUNTER — Telehealth: Payer: Self-pay | Admitting: Family Medicine

## 2017-11-30 NOTE — Telephone Encounter (Signed)
I have not seen these. Do you know if Dr Brigitte Pulse has?

## 2017-11-30 NOTE — Telephone Encounter (Signed)
Copied from [facility] [id number]. Topic: Quick Communication - See Telephone Encounter >> Nov 30, 2017  1:36 PM Rutherford Nail, [place] wrote: CRM for notification. See Telephone encounter for: 11/30/17. Patient calling to check status of paperwork that was dropped of on 11/27/17 by [employer]. States that this paperwork is for the [facility] to get authorization for the [employer] States that it was 2 sets of 3 page forms to have Dr Brigitte Pulse or Dr Pamella Pert sign. States that [place] informed patient that he would like to pick these forms up from the office when they are complete, so he could get her into the system faster. Spoke with Theadora Rama at PCP and she found the forms in Dr Raul Del box in a [place] envelope and was going to get them to Dr Pamella Pert for completion. Once complete, they would give patient or [employer] a call.

## 2017-11-30 NOTE — Telephone Encounter (Signed)
Put the paperwork in Dr. Pamella Pert box to be filled out.  It is a Carolyn Pena patient she dropped her paperwork off on Monday Call patient when completed

## 2017-12-01 NOTE — Telephone Encounter (Signed)
Patient states paperwork is time sensitive and is wanting it completed ASAP. Patient wants a phone call when ready.

## 2017-12-02 ENCOUNTER — Other Ambulatory Visit: Payer: Self-pay | Admitting: Family Medicine

## 2017-12-04 ENCOUNTER — Telehealth: Payer: Self-pay | Admitting: *Deleted

## 2017-12-04 NOTE — Telephone Encounter (Signed)
Patient was advised paperwork ready for pickup at 102 Patient voiced understanding

## 2017-12-08 DIAGNOSIS — R1084 Generalized abdominal pain: Secondary | ICD-10-CM | POA: Diagnosis not present

## 2017-12-08 DIAGNOSIS — K580 Irritable bowel syndrome with diarrhea: Secondary | ICD-10-CM | POA: Diagnosis not present

## 2017-12-26 ENCOUNTER — Ambulatory Visit (INDEPENDENT_AMBULATORY_CARE_PROVIDER_SITE_OTHER): Payer: BLUE CROSS/BLUE SHIELD | Admitting: Family Medicine

## 2017-12-26 ENCOUNTER — Encounter: Payer: Self-pay | Admitting: *Deleted

## 2017-12-26 ENCOUNTER — Other Ambulatory Visit (HOSPITAL_COMMUNITY)
Admission: RE | Admit: 2017-12-26 | Discharge: 2017-12-26 | Disposition: A | Discharge status: Home / Self Care | Payer: BLUE CROSS/BLUE SHIELD | Source: Ambulatory Visit | Attending: Family Medicine | Admitting: Family Medicine

## 2017-12-26 ENCOUNTER — Encounter: Payer: Self-pay | Admitting: Family Medicine

## 2017-12-26 VITALS — BP 132/90 | HR 97 | Wt 135.0 lb

## 2017-12-26 DIAGNOSIS — N766 Ulceration of vulva: Secondary | ICD-10-CM | POA: Diagnosis not present

## 2017-12-26 DIAGNOSIS — Z01419 Encounter for gynecological examination (general) (routine) without abnormal findings: Secondary | ICD-10-CM

## 2017-12-26 DIAGNOSIS — Z975 Presence of (intrauterine) contraceptive device: Secondary | ICD-10-CM

## 2017-12-26 DIAGNOSIS — Z124 Encounter for screening for malignant neoplasm of cervix: Secondary | ICD-10-CM | POA: Diagnosis not present

## 2017-12-26 DIAGNOSIS — D281 Benign neoplasm of vagina: Secondary | ICD-10-CM | POA: Diagnosis not present

## 2017-12-26 DIAGNOSIS — N83201 Unspecified ovarian cyst, right side: Secondary | ICD-10-CM | POA: Insufficient documentation

## 2017-12-26 NOTE — Progress Notes (Signed)
  Subjective:     Carolyn Pena is a 42 y.o. female and is here for a comprehensive physical exam. The patient reports problems - multiple. Patient is a professor at [employer]. Moved here from [place]. She has CP and is wheelchair dependent. Notes that she has a Nexplanon but still having cramping. Nexplanon in x 8-9 years, changed out q 3 years, last done 1 year ago. Has a pressure sore at introitus. Having some pain related to this. Also with recent treatment for yeast vaginitis and yet symptoms continue. Recent CT showed 3 cm simple ovarian cyst, persistent since 2016. No pelvic sono. Has noticed sore area in vagina that has been present for > 1 month. It has a white border.  The following portions of the patient's history were reviewed and updated as appropriate: allergies, current medications, past family history, past medical history, past social history, past surgical history and problem list.  Review of Systems Pertinent items are noted in HPI.   Objective:    BP 132/90   Pulse 97   Wt 135 lb (61.2 kg)   LMP 12/15/2017 (Exact Date) Comment: pt has a nexplanon   BMI 26.37 kg/m  General appearance: alert, cooperative and appears stated age Head: Normocephalic, without obvious abnormality, atraumatic Neck: no adenopathy, supple, symmetrical, trachea midline and thyroid not enlarged, symmetric, no tenderness/mass/nodules Lungs: clear to auscultation bilaterally Breasts: normal appearance, no masses or tenderness Heart: regular rate and rhythm Abdomen: soft, non-tender; bowel sounds normal; no masses,  no organomegaly Pelvic: cervix normal in appearance and vaginal introitus with small ulcerative lesion with heaping up on the sides. Extremities: multiple contractures  Pulses: 2+ and symmetric Skin: Skin color, texture, turgor normal. No rashes or lesions Lymph nodes: Cervical, supraclavicular, and axillary nodes normal. Neurologic:wheel chair bound, non-weight bearing  Patient identified, informed consent signed, copy in chart, time out performed.    Procedure: Area cleaned with Betadine and 4 mm punch biopsy performed without difficulty.  Hemostasis obtained with pressure.  Patient tolerated procedure well.    Assessment:    GYN exam   Plan:      Problem List Items Addressed This Visit      Unprioritized   Vulvar ulcer    Unclear etiology, biopsy taken--manage based on results.      Relevant Orders   Surgical pathology   Right ovarian cyst    Appears simple and stable x 3 years--no worrisome features, will continue to watch      Nexplanon in place    Due to be changed in 2021       Other Visit Diagnoses    Screening for malignant neoplasm of cervix    -  Primary   Relevant Orders   Cytology - PAP       See After Visit Summary for Counseling Recommendations

## 2017-12-26 NOTE — Assessment & Plan Note (Signed)
Appears simple and stable x 3 years--no worrisome features, will continue to watch

## 2017-12-26 NOTE — Patient Instructions (Signed)
Preventive Care 18-39 Years, Female Preventive care refers to lifestyle choices and visits with your health care provider that can promote health and wellness. What does preventive care include?  A yearly physical exam. This is also called an annual well check.  Dental exams once or twice a year.  Routine eye exams. Ask your health care provider how often you should have your eyes checked.  Personal lifestyle choices, including: ? Daily care of your teeth and gums. ? Regular physical activity. ? Eating a healthy diet. ? Avoiding tobacco and drug use. ? Limiting alcohol use. ? Practicing safe sex. ? Taking vitamin and mineral supplements as recommended by your health care provider. What happens during an annual well check? The services and screenings done by your health care provider during your annual well check will depend on your age, overall health, lifestyle risk factors, and family history of disease. Counseling Your health care provider may ask you questions about your:  Alcohol use.  Tobacco use.  Drug use.  Emotional well-being.  Home and relationship well-being.  Sexual activity.  Eating habits.  Work and work Statistician.  Method of birth control.  Menstrual cycle.  Pregnancy history.  Screening You may have the following tests or measurements:  Height, weight, and BMI.  Diabetes screening. This is done by checking your blood sugar (glucose) after you have not eaten for a while (fasting).  Blood pressure.  Lipid and cholesterol levels. These may be checked every 5 years starting at age 40.  Skin check.  Hepatitis C blood test.  Hepatitis B blood test.  Sexually transmitted disease (STD) testing.  BRCA-related cancer screening. This may be done if you have a family history of breast, ovarian, tubal, or peritoneal cancers.  Pelvic exam and Pap test. This may be done every 3 years starting at age 100. Starting at age 38, this may be done  every 5 years if you have a Pap test in combination with an HPV test.  Discuss your test results, treatment options, and if necessary, the need for more tests with your health care provider. Vaccines Your health care provider may recommend certain vaccines, such as:  Influenza vaccine. This is recommended every year.  Tetanus, diphtheria, and acellular pertussis (Tdap, Td) vaccine. You may need a Td booster every 10 years.  Varicella vaccine. You may need this if you have not been vaccinated.  HPV vaccine. If you are 43 or younger, you may need three doses over 6 months.  Measles, mumps, and rubella (MMR) vaccine. You may need at least one dose of MMR. You may also need a second dose.  Pneumococcal 13-valent conjugate (PCV13) vaccine. You may need this if you have certain conditions and were not previously vaccinated.  Pneumococcal polysaccharide (PPSV23) vaccine. You may need one or two doses if you smoke cigarettes or if you have certain conditions.  Meningococcal vaccine. One dose is recommended if you are age 32-21 years and a first-year college student living in a residence hall, or if you have one of several medical conditions. You may also need additional booster doses.  Hepatitis A vaccine. You may need this if you have certain conditions or if you travel or work in places where you may be exposed to hepatitis A.  Hepatitis B vaccine. You may need this if you have certain conditions or if you travel or work in places where you may be exposed to hepatitis B.  Haemophilus influenzae type b (Hib) vaccine. You may need this  if you have certain risk factors.  Talk to your health care provider about which screenings and vaccines you need and how often you need them. This information is not intended to replace advice given to you by your health care provider. Make sure you discuss any questions you have with your health care provider. Document Released: 04/12/2001 Document Revised:  11/04/2015 Document Reviewed: 12/16/2014 Elsevier Interactive Patient Education  2018 Elsevier Inc.  

## 2017-12-26 NOTE — Assessment & Plan Note (Signed)
Unclear etiology, biopsy taken--manage based on results.

## 2017-12-26 NOTE — Progress Notes (Signed)
Pt has a sore near entrance to vagina. Pt had a recent abdomen CT and it showed an ovarian cyst on left ovary pt would like to discuss. Pt had last Pap in July 2018 and pt states it was normal.

## 2017-12-26 NOTE — Assessment & Plan Note (Signed)
Due to be changed in 2021

## 2017-12-28 LAB — CYTOLOGY - PAP
Adequacy: ABSENT
BACTERIAL VAGINITIS: NEGATIVE
Candida vaginitis: NEGATIVE
Diagnosis: NEGATIVE
HPV: NOT DETECTED

## 2017-12-29 ENCOUNTER — Ambulatory Visit: Payer: Self-pay | Admitting: Family Medicine

## 2017-12-29 NOTE — Progress Notes (Deleted)
No chief complaint on file.   HPI  4 review of systems  Past Medical History:  Diagnosis Date  . Allergy   . Anemia   . Anxiety   . Arthritis   . Asthma   . Chronic migraine without aura without status migrainosus, not intractable 03/10/2015  . Chronic paraplegia ([place])   . Complication of anesthesia   . CP (cerebral palsy) ([place])   . CRPS (complex regional pain syndrome), upper limb 02/28/2015  . Depression   . Family history of adverse reaction to anesthesia    sister- severe n/v  . GERD (gastroesophageal reflux disease)   . H/O multiple concussions   . Headache   . Hearing loss 10/11/2016  . Neuromuscular disorder ([place])    CP  . PONV (postoperative nausea and vomiting)    Severe  . PTSD (post-traumatic stress disorder)   . UTI (urinary tract infection)    hospitalized    Current Outpatient Medications  Medication Sig Dispense Refill  . albuterol (PROVENTIL HFA) 108 (90 Base) MCG/ACT inhaler INL 2 PFS PO INTO THE LUNGS Q 6 H PRF WHZ OR SOB 18 g 5  . albuterol (PROVENTIL) (2.5 MG/3ML) 0.083% nebulizer solution Inhale into the lungs.    Marland Kitchen azelastine (ASTELIN) 0.1 % nasal spray Place 2 sprays into the nose 2 (two) times daily.    . busPIRone (BUSPAR) 5 MG tablet TAKE 1 TABLET BY MOUTH TWICE DAILY    . butalbital-acetaminophen-caffeine (FIORICET, ESGIC) 50-325-40 MG tablet Take 1 tablet by mouth every 4 (four) hours as needed for headache. 14 tablet 3  . clonazePAM (KLONOPIN) 1 MG tablet Take 1 tablet (1 mg total) by mouth 2 (two) times daily as needed for anxiety. 60 tablet 1  . Control Gel Formula Dressing (DUODERM CGF DRESSING) MISC Use as directed multiple times daily for gluteal pressure ulcer wound care 60 each 11  . diphenoxylate-atropine (LOMOTIL) 2.5-0.025 MG tablet TAKE 1 TABLET BY MOUTH 4 TIMES DAILY FOR DIARRHEA 120 tablet 1  . EPINEPHrine (EPIPEN JR) 0.15 MG/0.3ML injection Inject as directed as needed.    . etonogestrel (NEXPLANON) 68 MG IMPL implant Inject  into the skin.    Marland Kitchen Fluticasone-Salmeterol (ADVAIR DISKUS) 250-50 MCG/DOSE AEPB Inhale 1 puff into the lungs 2 (two) times daily. 1 each 4  . gabapentin (NEURONTIN) 300 MG capsule Take 600 mg by mouth 3 (three) times daily.  1  . Hydroactive Dressings (DUODERM HYDROACTIVE) GEL Use as directed multiple times daily for gluteal pressure ulcer wound care 30 g 11  . ibuprofen (ADVIL,MOTRIN) 800 MG tablet Take 800 mg by mouth every 8 (eight) hours as needed. for pain  3  . levocetirizine (XYZAL) 5 MG tablet Take 1 tablet (5 mg total) by mouth every evening. 30 tablet 5  . Multiple Vitamin (THERA) TABS Take by mouth.    . mupirocin ointment (BACTROBAN) 2 % Apply 1 application topically 3 (three) times daily as needed.  11  . nystatin (MYCOSTATIN) 100000 UNIT/ML suspension Take 5 mLs (500,000 Units total) by mouth 4 (four) times daily. 60 mL 0  . potassium chloride SA (K-DUR,KLOR-CON) 20 MEQ tablet Take by mouth.    . promethazine (PHENERGAN) 25 MG tablet Take 1 tablet (25 mg total) by mouth every 8 (eight) hours as needed for nausea or vomiting. 20 tablet 3  . rizatriptan (MAXALT) 5 MG tablet 1 at migraine onset and repeat after 2 hours if needed.    . sertraline (ZOLOFT) 100 MG tablet Take 2 tablets (  200 mg total) by mouth daily. 180 tablet 1  . sodium fluoride (FLUORISHIELD) 1.1 % GEL dental gel APP A PEA-SIZED AMOUNT TO TOOTHBRUSH AND BRUTH TEETH THROUGHLY FOR 2 MINUTES DAILY    . traZODone (DESYREL) 50 MG tablet Take three at bedtime for sleep.    Marland Kitchen triamcinolone cream (KENALOG) 0.1 % Apply 1 application topically 2 (two) times daily. 30 g 0  . Zinc Oxide (SECURA EX) Apply 1 application topically daily as needed (rash).     No current facility-administered medications for this visit.     Allergies:  Allergies  Allergen Reactions  . Lidocaine Swelling    Had steroid injection doesn't know if it was the steroid or lidocaine. "Whole arm swelled & warm to touch. Severe inflamation."   . Augmentin  [Amoxicillin-Pot Clavulanate] Diarrhea and Nausea And Vomiting    Has patient had a PCN reaction causing immediate rash, facial/tongue/throat swelling, SOB or lightheadedness with hypotension: No Has patient had a PCN reaction causing severe rash involving mucus membranes or skin necrosis: No Has patient had a PCN reaction that required hospitalization: No Has patient had a PCN reaction occurring within the last 10 years: #  #  #  YES  #  #  #  If all of the above answers are "NO", then may proceed with Cephalosporin use.   . Cefdinir Diarrhea  . Codeine Nausea And Vomiting  . Lortab [Hydrocodone-Acetaminophen] Nausea And Vomiting  . Methylprednisolone Rash    Had lidocaine as well  . Other Nausea And Vomiting    general anesthesia causes severe nausea and vomiting     Past Surgical History:  Procedure Laterality Date  . achilles release Bilateral   . EYE SURGERY    . HAMSTRING RELEASE Bilateral   . HIP SURGERY Bilateral   . JOINT REPLACEMENT    . PHOTOCOAGULATION WITH LASER Bilateral 07/03/2017   Procedure: LASER DEMARCATION BILATERAL EYES;  Surgeon: Jalene Mullet, MD;  Location: [facility];  Service: Ophthalmology;  Laterality: Bilateral;  . STRABISMUS SURGERY     2008, 1999    Social History   Socioeconomic History  . Marital status: Single    Spouse name: Not on file  . Number of children: Not on file  . Years of education: Not on file  . Highest education level: Not on file  Occupational History  . Not on file  Social Needs  . Financial resource strain: Not on file  . Food insecurity:    Worry: Not on file    Inability: Not on file  . Transportation needs:    Medical: Not on file    Non-medical: Not on file  Tobacco Use  . Smoking status: Never Smoker  . Smokeless tobacco: Never Used  Substance and Sexual Activity  . Alcohol use: Yes    Alcohol/week: 0.0 standard drinks    Comment: occ  . Drug use: No  . Sexual activity: Not Currently  Lifestyle  . Physical  activity:    Days per week: Not on file    Minutes per session: Not on file  . Stress: Not on file  Relationships  . Social connections:    Talks on phone: Not on file    Gets together: Not on file    Attends religious service: Not on file    Active member of club or organization: Not on file    Attends meetings of clubs or organizations: Not on file    Relationship status: Not on file  Other  Topics Concern  . Not on file  Social History Narrative   Pt has doctorate degree    Family History  Problem Relation Age of Onset  . Cancer Mother        CLL, decsd in 2012  . Cerebral palsy Sister   . Breast cancer Neg Hx      ROS Review of Systems See HPI Constitution: No fevers or chills No malaise No diaphoresis Skin: No rash or itching Eyes: no blurry vision, no double vision GU: no dysuria or hematuria Neuro: no dizziness or headaches * all others reviewed and negative   Objective: There were no vitals filed for this visit.  Physical Exam  Assessment and Plan There are no diagnoses linked to this encounter.   Raynaldo Falco P [employer]

## 2017-12-30 ENCOUNTER — Ambulatory Visit (INDEPENDENT_AMBULATORY_CARE_PROVIDER_SITE_OTHER): Payer: BLUE CROSS/BLUE SHIELD | Admitting: Family Medicine

## 2017-12-30 ENCOUNTER — Encounter: Payer: Self-pay | Admitting: Family Medicine

## 2017-12-30 VITALS — BP 109/73 | HR 82 | Temp 99.4°F | Resp 18

## 2017-12-30 DIAGNOSIS — J069 Acute upper respiratory infection, unspecified: Secondary | ICD-10-CM

## 2017-12-30 DIAGNOSIS — D508 Other iron deficiency anemias: Secondary | ICD-10-CM

## 2017-12-30 DIAGNOSIS — R5383 Other fatigue: Secondary | ICD-10-CM | POA: Diagnosis not present

## 2017-12-30 DIAGNOSIS — M791 Myalgia, unspecified site: Secondary | ICD-10-CM

## 2017-12-30 LAB — POCT CBC
Granulocyte percent: 80.7 %G — AB (ref 37–80)
HCT, POC: 37.5 % (ref 29–41)
Hemoglobin: 12.2 g/dL (ref 9.5–13.5)
LYMPH, POC: 1.4 (ref 0.6–3.4)
MCH, POC: 26.1 pg — AB (ref 27–31.2)
MCHC: 32.5 g/dL (ref 31.8–35.4)
MCV: 80.1 fL (ref 76–111)
MID (CBC): 0.5 (ref 0–0.9)
MPV: 6.9 fL (ref 0–99.8)
PLATELET COUNT, POC: 210 10*3/uL (ref 142–424)
POC Granulocyte: 7.7 — AB (ref 2–6.9)
POC LYMPH %: 14.3 % (ref 10–50)
POC MID %: 5 %M (ref 0–12)
RBC: 4.68 M/uL (ref 4.04–5.48)
RDW, POC: 16.1 %
WBC: 9.5 10*3/uL (ref 4.6–10.2)

## 2017-12-30 LAB — POCT RAPID STREP A (OFFICE): RAPID STREP A SCREEN: NEGATIVE

## 2017-12-30 LAB — POCT INFLUENZA A/B
INFLUENZA A, POC: NEGATIVE
INFLUENZA B, POC: NEGATIVE

## 2017-12-30 MED ORDER — NYSTATIN 100000 UNIT/ML MT SUSP: 5.0000 mL | Freq: Four times a day (QID) | OROMUCOSAL | 0 refills | Status: DC

## 2017-12-30 NOTE — Patient Instructions (Signed)
° ° ° °  If you have lab work done today you will be contacted with your lab results within the next 2 weeks.  If you have not heard from us then please contact us. The fastest way to get your results is to register for My Chart. ° ° °IF you received an x-ray today, you will receive an invoice from [facility]. Please contact [facility] at [phone] with questions or concerns regarding your invoice.  ° °IF you received labwork today, you will receive an invoice from [facility]. Please contact [facility] at [phone] with questions or concerns regarding your invoice.  ° °Our billing staff will not be able to assist you with questions regarding bills from these companies. ° °You will be contacted with the lab results as soon as they are available. The fastest way to get your results is to activate your My Chart account. Instructions are located on the last page of this paperwork. If you have not heard from us regarding the results in 2 weeks, please contact this office. °  ° ° ° °

## 2017-12-30 NOTE — Progress Notes (Signed)
Chief Complaint  Patient presents with  . respiratory issue    ongoing respiratory issue since September; states she has a hoarse cough with clear sputum  . blood work    is requesting blood work to check her iron levels; states she has anemia history    HPI  Pt reports that she has been having symptoms of hoarseness She feels like she is just tired and has a sensation of swallowing razor blades She states that she has pain with swallowing She also has a cough that is hoarse and causing her to use her inhaler She only uses her inhaler once every couple of day She has not required her nebulizer  Iron deficiency anemia Lab Results  Component Value Date   WBC 9.5 12/30/2017   HGB 12.2 12/30/2017   HCT 37.5 12/30/2017   MCV 80.1 12/30/2017   PLT 225 07/03/2017   She is taking an iron supplement for her anemia She has some fatigue and muscle aches but otherwise is eating and drinking her typical amounts   Past Medical History:  Diagnosis Date  . Allergy   . Anemia   . Anxiety   . Arthritis   . Asthma   . Chronic migraine without aura without status migrainosus, not intractable 03/10/2015  . Chronic paraplegia ([place])   . Complication of anesthesia   . CP (cerebral palsy) ([place])   . CRPS (complex regional pain syndrome), upper limb 02/28/2015  . Depression   . Family history of adverse reaction to anesthesia    sister- severe n/v  . GERD (gastroesophageal reflux disease)   . H/O multiple concussions   . Headache   . Hearing loss 10/11/2016  . Neuromuscular disorder ([place])    CP  . PONV (postoperative nausea and vomiting)    Severe  . PTSD (post-traumatic stress disorder)   . UTI (urinary tract infection)    hospitalized    Current Outpatient Medications  Medication Sig Dispense Refill  . albuterol (PROVENTIL HFA) 108 (90 Base) MCG/ACT inhaler INL 2 PFS PO INTO THE LUNGS Q 6 H PRF WHZ OR SOB 18 g 5  . albuterol (PROVENTIL) (2.5 MG/3ML) 0.083% nebulizer solution  Inhale into the lungs.    Marland Kitchen azelastine (ASTELIN) 0.1 % nasal spray Place 2 sprays into the nose 2 (two) times daily.    . busPIRone (BUSPAR) 5 MG tablet TAKE 1 TABLET BY MOUTH TWICE DAILY    . butalbital-acetaminophen-caffeine (FIORICET, ESGIC) 50-325-40 MG tablet Take 1 tablet by mouth every 4 (four) hours as needed for headache. 14 tablet 3  . clonazePAM (KLONOPIN) 1 MG tablet Take 1 tablet (1 mg total) by mouth 2 (two) times daily as needed for anxiety. 60 tablet 1  . Control Gel Formula Dressing (DUODERM CGF DRESSING) MISC Use as directed multiple times daily for gluteal pressure ulcer wound care 60 each 11  . diphenoxylate-atropine (LOMOTIL) 2.5-0.025 MG tablet TAKE 1 TABLET BY MOUTH 4 TIMES DAILY FOR DIARRHEA 120 tablet 1  . EPINEPHrine (EPIPEN JR) 0.15 MG/0.3ML injection Inject as directed as needed.    . etonogestrel (NEXPLANON) 68 MG IMPL implant Inject into the skin.    Marland Kitchen Fluticasone-Salmeterol (ADVAIR DISKUS) 250-50 MCG/DOSE AEPB Inhale 1 puff into the lungs 2 (two) times daily. 1 each 4  . gabapentin (NEURONTIN) 300 MG capsule Take 600 mg by mouth 3 (three) times daily.  1  . Hydroactive Dressings (DUODERM HYDROACTIVE) GEL Use as directed multiple times daily for gluteal pressure ulcer wound care 30 g  11  . ibuprofen (ADVIL,MOTRIN) 800 MG tablet Take 800 mg by mouth every 8 (eight) hours as needed. for pain  3  . levocetirizine (XYZAL) 5 MG tablet Take 1 tablet (5 mg total) by mouth every evening. 30 tablet 5  . Multiple Vitamin (THERA) TABS Take by mouth.    . mupirocin ointment (BACTROBAN) 2 % Apply 1 application topically 3 (three) times daily as needed.  11  . nystatin (MYCOSTATIN) 100000 UNIT/ML suspension Take 5 mLs (500,000 Units total) by mouth 4 (four) times daily. 60 mL 0  . potassium chloride SA (K-DUR,KLOR-CON) 20 MEQ tablet Take by mouth.    . promethazine (PHENERGAN) 25 MG tablet Take 1 tablet (25 mg total) by mouth every 8 (eight) hours as needed for nausea or vomiting.  20 tablet 3  . rizatriptan (MAXALT) 5 MG tablet 1 at migraine onset and repeat after 2 hours if needed.    . sertraline (ZOLOFT) 100 MG tablet Take 2 tablets (200 mg total) by mouth daily. 180 tablet 1  . sodium fluoride (FLUORISHIELD) 1.1 % GEL dental gel APP A PEA-SIZED AMOUNT TO TOOTHBRUSH AND BRUTH TEETH THROUGHLY FOR 2 MINUTES DAILY    . traZODone (DESYREL) 50 MG tablet Take three at bedtime for sleep.    Marland Kitchen triamcinolone cream (KENALOG) 0.1 % Apply 1 application topically 2 (two) times daily. 30 g 0  . Zinc Oxide (SECURA EX) Apply 1 application topically daily as needed (rash).     No current facility-administered medications for this visit.     Allergies:  Allergies  Allergen Reactions  . Lidocaine Swelling    Had steroid injection doesn't know if it was the steroid or lidocaine. "Whole arm swelled & warm to touch. Severe inflamation."   . Augmentin [Amoxicillin-Pot Clavulanate] Diarrhea and Nausea And Vomiting    Has patient had a PCN reaction causing immediate rash, facial/tongue/throat swelling, SOB or lightheadedness with hypotension: No Has patient had a PCN reaction causing severe rash involving mucus membranes or skin necrosis: No Has patient had a PCN reaction that required hospitalization: No Has patient had a PCN reaction occurring within the last 10 years: #  #  #  YES  #  #  #  If all of the above answers are "NO", then may proceed with Cephalosporin use.   . Cefdinir Diarrhea  . Codeine Nausea And Vomiting  . Lortab [Hydrocodone-Acetaminophen] Nausea And Vomiting  . Methylprednisolone Rash    Had lidocaine as well  . Other Nausea And Vomiting    general anesthesia causes severe nausea and vomiting     Past Surgical History:  Procedure Laterality Date  . achilles release Bilateral   . EYE SURGERY    . HAMSTRING RELEASE Bilateral   . HIP SURGERY Bilateral   . JOINT REPLACEMENT    . PHOTOCOAGULATION WITH LASER Bilateral 07/03/2017   Procedure: LASER DEMARCATION  BILATERAL EYES;  Surgeon: Jalene Mullet, MD;  Location: [facility];  Service: Ophthalmology;  Laterality: Bilateral;  . STRABISMUS SURGERY     2008, 1999    Social History   Socioeconomic History  . Marital status: Single    Spouse name: Not on file  . Number of children: Not on file  . Years of education: Not on file  . Highest education level: Not on file  Occupational History  . Not on file  Social Needs  . Financial resource strain: Not on file  . Food insecurity:    Worry: Not on file  Inability: Not on file  . Transportation needs:    Medical: Not on file    Non-medical: Not on file  Tobacco Use  . Smoking status: Never Smoker  . Smokeless tobacco: Never Used  Substance and Sexual Activity  . Alcohol use: Yes    Alcohol/week: 0.0 standard drinks    Comment: occ  . Drug use: No  . Sexual activity: Not Currently  Lifestyle  . Physical activity:    Days per week: Not on file    Minutes per session: Not on file  . Stress: Not on file  Relationships  . Social connections:    Talks on phone: Not on file    Gets together: Not on file    Attends religious service: Not on file    Active member of club or organization: Not on file    Attends meetings of clubs or organizations: Not on file    Relationship status: Not on file  Other Topics Concern  . Not on file  Social History Narrative   Pt has doctorate degree    Family History  Problem Relation Age of Onset  . Cancer Mother        CLL, decsd in 2012  . Cerebral palsy Sister   . Breast cancer Neg Hx      ROS Review of Systems See HPI Constitution: No fevers or chills No malaise No diaphoresis Skin: No rash or itching Eyes: no blurry vision, no double vision GU: no dysuria or hematuria Neuro: no dizziness or headaches all others reviewed and negative   Objective: Vitals:   12/30/17 1251  BP: 109/73  Pulse: 82  Resp: 18  Temp: 99.4 F (37.4 C)  TempSrc: Oral  SpO2: 96%    Physical  Exam  General: alert, oriented, in NAD Head: normocephalic, atraumatic, no sinus tenderness Eyes: EOM intact, no scleral icterus or conjunctival injection Ears: TM clear bilaterally Nose: mucosa nonerythematous, nonedematous Throat: dry jjjtongue with evidencno pharyngeal exudate or erythema Lymph: no posterior auricular, submental or cervical lymph adenopathy Heart: normal rate, normal sinus rhythm, no murmurs Lungs: clear to auscultation bilaterally, no wheezing   Assessment and Plan Carolyn Pena was seen today for respiratory issue and blood work.  Diagnoses and all orders for this visit:  Myalgia -     POCT rapid strep A -     POCT Influenza A/B -     POCT CBC  Fatigue, unspecified type -     POCT CBC  Viral URI  Other iron deficiency anemia  Other orders -     nystatin (MYCOSTATIN) 100000 UNIT/ML suspension; Take 5 mLs (500,000 Units total) by mouth 4 (four) times daily.   Advised pt to gargle with luke warm water Rest and take Monday off work 11/4 Work note sent to [employer] Viral illness Supportive care advised  Anemia improved   Carolyn Pena A Camella Seim

## 2017-12-31 ENCOUNTER — Other Ambulatory Visit: Payer: Self-pay | Admitting: Family Medicine

## 2018-01-01 NOTE — Telephone Encounter (Signed)
Pt was seen in [place] 11/24/08 by [place] - rx was refilled for clonazepam 23m #60 with 1 refill at that visit.

## 2018-01-04 ENCOUNTER — Telehealth: Payer: Self-pay | Admitting: Radiology

## 2018-01-04 MED ORDER — LORAZEPAM 2 MG PO TABS: 2.0000 mg | ORAL_TABLET | Freq: Once | ORAL | 0 refills | Status: AC

## 2018-01-04 NOTE — Telephone Encounter (Signed)
Left message for patient to call [id number] to schedule appointment with Dr Kennon Rounds

## 2018-01-13 ENCOUNTER — Other Ambulatory Visit: Payer: Self-pay | Admitting: Family Medicine

## 2018-01-15 NOTE — Telephone Encounter (Signed)
Requested medication (s) are due for refill today: Yes  Requested medication (s) are on the active medication list: Yes  Last refill:  07/28/17  Future visit scheduled: Yes  Notes to clinic:  Unable to refill per protocol     Requested Prescriptions  Pending Prescriptions Disp Refills   butalbital-acetaminophen-caffeine (FIORICET, ESGIC) 50-325-40 MG tablet [Pharmacy Med Name: BUTAL/ACETAMN/CF 50-325-40 TAB] 28 tablet 1    Sig: TAKE 1 TABLET BY MOUTH EVERY 4 HOURS AS NEEDED FOR HEADACHE     Not Delegated - Analgesics:  Non-Opioid Analgesic Combinations Failed - 01/13/2018  2:58 PM      Failed - This refill cannot be delegated      Passed - Valid encounter within last 12 months    Recent Outpatient Visits          2 weeks ago Myalgia   [facility] at [facility], Arlie Solomons, MD   1 month ago Dysuria   [facility], Lilia Argue, MD   2 months ago Acute upper respiratory infection   [facility], Lilia Argue, MD   3 months ago Otalgia, bilateral   [facility] at [facility], [place], MD   5 months ago Urinary symptom or sign   [facility] at [facility], Laurey Arrow, MD      Future Appointments            In 1 week Shawnee Knapp, MD [facility] at [place], [facility]         Signed Prescriptions Disp Refills   sertraline (ZOLOFT) 100 MG tablet 180 tablet 1    Sig: TAKE 2 TABLETS BY MOUTH ONCE DAILY     Psychiatry:  Antidepressants - SSRI Passed - 01/13/2018  2:58 PM      Passed - Completed PHQ-2 or PHQ-9 in the last 360 days.      Passed - Valid encounter within last 6 months    Recent Outpatient Visits          2 weeks ago Myalgia   [facility] at [facility], Arlie Solomons, MD   1 month ago Dysuria   [facility] at [facility], Lilia Argue, MD   2 months ago Acute upper respiratory infection   [facility], Lilia Argue, MD   3 months ago Otalgia, bilateral   Primary Care at [facility], [place], MD   5 months ago  Urinary symptom or sign   [facility] at [facility], Laurey Arrow, MD      Future Appointments            In 1 week Shawnee Knapp, MD Primary Care at [place], [facility]

## 2018-01-15 NOTE — Telephone Encounter (Signed)
Attempted to call patient regarding nystatin 100000 unit/mi. No answer, left message for patient to call back and clarify if she still needs the nystatin.

## 2018-01-17 NOTE — Telephone Encounter (Signed)
Last refill 07/10/17 expired was 14 tabs w/ 3 refills Refilled Meds ordered this encounter  Medications  . sertraline (ZOLOFT) 100 MG tablet    Sig: TAKE 2 TABLETS BY MOUTH ONCE DAILY    Dispense:  180 tablet    Refill:  1  . butalbital-acetaminophen-caffeine (FIORICET, ESGIC) 50-325-40 MG tablet    Sig: TAKE 1 TABLET BY MOUTH EVERY 4 HOURS AS NEEDED FOR HEADACHE    Dispense:  28 tablet    Refill:  1    Please consider 90 day supplies to promote better adherence

## 2018-01-18 ENCOUNTER — Telehealth: Payer: Self-pay | Admitting: Family Medicine

## 2018-01-18 NOTE — Telephone Encounter (Signed)
Per Solmon Ice given to [place], [place]

## 2018-01-18 NOTE — Telephone Encounter (Signed)
Pt called looking for paperwork (Authorization for Med Scope ) that was dropped off by  [employer] her innovations waiver case manager on Tuesday the 19th . This is a very time sensitive for. Please see if this form may be located. If not please call patient, Carolyn Pena [phone].if form is located please call Nicki Reaper at [phone] or email him at [email or web address]

## 2018-01-23 ENCOUNTER — Encounter

## 2018-01-23 ENCOUNTER — Ambulatory Visit: Payer: Self-pay | Admitting: *Deleted

## 2018-01-23 ENCOUNTER — Ambulatory Visit (INDEPENDENT_AMBULATORY_CARE_PROVIDER_SITE_OTHER): Payer: BLUE CROSS/BLUE SHIELD | Admitting: Family Medicine

## 2018-01-23 ENCOUNTER — Encounter: Payer: Self-pay | Admitting: Family Medicine

## 2018-01-23 ENCOUNTER — Other Ambulatory Visit: Payer: Self-pay

## 2018-01-23 VITALS — BP 128/85 | HR 90 | Temp 98.2°F | Resp 14 | Wt 140.0 lb

## 2018-01-23 DIAGNOSIS — J0141 Acute recurrent pansinusitis: Secondary | ICD-10-CM

## 2018-01-23 DIAGNOSIS — G43909 Migraine, unspecified, not intractable, without status migrainosus: Secondary | ICD-10-CM | POA: Diagnosis not present

## 2018-01-23 DIAGNOSIS — Z79899 Other long term (current) drug therapy: Secondary | ICD-10-CM | POA: Diagnosis not present

## 2018-01-23 DIAGNOSIS — H6121 Impacted cerumen, right ear: Secondary | ICD-10-CM | POA: Diagnosis not present

## 2018-01-23 DIAGNOSIS — M791 Myalgia, unspecified site: Secondary | ICD-10-CM

## 2018-01-23 DIAGNOSIS — N766 Ulceration of vulva: Secondary | ICD-10-CM

## 2018-01-23 LAB — POCT CBC
GRANULOCYTE PERCENT: 64.3 % (ref 37–80)
HEMATOCRIT: 37.5 % (ref 29–41)
Hemoglobin: 12.3 g/dL (ref 9.5–13.5)
LYMPH, POC: 1.7 (ref 0.6–3.4)
MCH, POC: 26.4 pg — AB (ref 27–31.2)
MCHC: 32.8 g/dL (ref 31.8–35.4)
MCV: 80.6 fL (ref 76–111)
MID (CBC): 1.2 — AB (ref 0–0.9)
MPV: 7.3 fL (ref 0–99.8)
POC GRANULOCYTE: 5.3 (ref 2–6.9)
POC LYMPH %: 20.8 % (ref 10–50)
POC MID %: 14.9 %M — AB (ref 0–12)
Platelet Count, POC: 210 10*3/uL (ref 142–424)
RBC: 4.65 M/uL (ref 4.04–5.48)
RDW, POC: 16.1 %
WBC: 8.3 10*3/uL (ref 4.6–10.2)

## 2018-01-23 LAB — POCT SEDIMENTATION RATE: POCT SED RATE: 20 mm/hr (ref 0–22)

## 2018-01-23 MED ORDER — METHYLPREDNISOLONE ACETATE 80 MG/ML IJ SUSP
120.0000 mg | Freq: Once | INTRAMUSCULAR | Status: AC
  Administered 2018-01-23: 120 mg via INTRAMUSCULAR

## 2018-01-23 MED ORDER — ALBUTEROL SULFATE (2.5 MG/3ML) 0.083% IN NEBU: 2.5000 mg | INHALATION_SOLUTION | RESPIRATORY_TRACT | 3 refills | Status: AC | PRN

## 2018-01-23 MED ORDER — ALBUTEROL SULFATE 108 (90 BASE) MCG/ACT IN AEPB: 2.0000 | INHALATION_SPRAY | RESPIRATORY_TRACT | 11 refills | Status: AC | PRN

## 2018-01-23 MED ORDER — BENZONATATE 200 MG PO CAPS: 200.0000 mg | ORAL_CAPSULE | Freq: Three times a day (TID) | ORAL | 1 refills | Status: AC | PRN

## 2018-01-23 MED ORDER — PREDNISONE 5 MG PO TABS: 5.0000 mg | ORAL_TABLET | Freq: Every day | ORAL | 0 refills | Status: DC

## 2018-01-23 MED ORDER — KETOROLAC TROMETHAMINE 60 MG/2ML IM SOLN
60.0000 mg | Freq: Once | INTRAMUSCULAR | Status: AC
  Administered 2018-01-23: 60 mg via INTRAMUSCULAR

## 2018-01-23 MED ORDER — RIZATRIPTAN BENZOATE 5 MG PO TABS: ORAL_TABLET | ORAL | 11 refills | Status: DC

## 2018-01-23 MED ORDER — KETOROLAC TROMETHAMINE 30 MG/ML IJ SOLN: 30.0000 mg | Freq: Every day | INTRAMUSCULAR | 3 refills | Status: AC | PRN

## 2018-01-23 MED ORDER — AZITHROMYCIN 250 MG PO TABS: ORAL_TABLET | ORAL | 0 refills | Status: DC

## 2018-01-23 NOTE — Telephone Encounter (Signed)
Message from Vernona Rieger sent at 01/23/2018 11:42 AM EST   Summary: clarification of med   Larene Beach with [facility] called and said she just received a script for ketorolac (TORADOL) 30 MG/ML injection. She is saying per the instructions that she would need more than 1 ml, please advise.         Prescription for Toradol was sent in to the pharmacy today for 107m of Toradol to be dispensed and for the pt to inject 166minto muscle daily as needed. ShLarene Beachrom [place] per instructions she would need to dispense more than one mL.  [id number] [phone]

## 2018-01-23 NOTE — Progress Notes (Signed)
Subjective:    Patient: Carolyn Pena  DOB: 31-May-1975; 42 y.o.   MRN: [id number]  Chief Complaint  Patient presents with  . Follow-up    Gastritis   . URI    still having uri issus, strep and flu was done last visit here but all was neg      HPI She is going to get a new level of tier of care - [employer] is her innervations Water quality scientist.    She saw Nolon Rod 2 weeks ago and is still feeing crumby - everyone in her office has had pna, flu, or sinus infxns. Told it was a virus and to tuff it out.  She had to ask for magic mouthwash.  Feels like head was in fish bowel and tuns of head of congestions and ears fill "icky". Has been using tessalon pearls and tons of nyquil and vics cough drops and tea - voice improving. Is having fevers and chills but low grade. Feels like head is in a vice. Really bad migraine right now as well - feels like head is on fire. Took a maxalt yet w/o relief. Had to take zofran twice yesterday. But can't take promethazine unless not working.    zpack causes diarrhea. GI switched her to berbersi instead of lomotil on  - can she take both?  Medical History Past Medical History:  Diagnosis Date  . Allergy   . Anemia   . Anxiety   . Arthritis   . Asthma   . Chronic migraine without aura without status migrainosus, not intractable 03/10/2015  . Chronic paraplegia ([place])   . Complication of anesthesia   . CP (cerebral palsy) ([place])   . CRPS (complex regional pain syndrome), upper limb 02/28/2015  . Depression   . Family history of adverse reaction to anesthesia    sister- severe n/v  . GERD (gastroesophageal reflux disease)   . H/O multiple concussions   . Headache   . Hearing loss 10/11/2016  . Neuromuscular disorder ([place])    CP  . PONV (postoperative nausea and vomiting)    Severe  . PTSD (post-traumatic stress disorder)   . UTI (urinary tract infection)    hospitalized   Past Surgical History:  Procedure Laterality Date  . achilles release  Bilateral   . EYE SURGERY    . HAMSTRING RELEASE Bilateral   . HIP SURGERY Bilateral   . JOINT REPLACEMENT    . PHOTOCOAGULATION WITH LASER Bilateral 07/03/2017   Procedure: LASER DEMARCATION BILATERAL EYES;  Surgeon: Jalene Mullet, MD;  Location: [place] [facility];  Service: Ophthalmology;  Laterality: Bilateral;  . STRABISMUS SURGERY     2008, 1999   Current Outpatient Medications on File Prior to Visit  Medication Sig Dispense Refill  . albuterol (PROVENTIL HFA) 108 (90 Base) MCG/ACT inhaler INL 2 PFS PO INTO THE LUNGS Q 6 H PRF WHZ OR SOB 18 g 5  . albuterol (PROVENTIL) (2.5 MG/3ML) 0.083% nebulizer solution Inhale into the lungs.    . benzonatate (TESSALON) 100 MG capsule Take 100 mg by mouth 3 (three) times daily as needed for cough.    . Blood Pressure Monitoring (ADULT BLOOD PRESSURE CUFF LG) KIT Check blood pressure 2 times daily    . busPIRone (BUSPAR) 5 MG tablet TAKE 1 TABLET BY MOUTH TWICE DAILY    . butalbital-acetaminophen-caffeine (FIORICET, ESGIC) 50-325-40 MG tablet TAKE 1 TABLET BY MOUTH EVERY 4 HOURS AS NEEDED FOR HEADACHE 28 tablet 1  . clonazePAM (KLONOPIN) 1 MG  tablet Take 1 tablet (1 mg total) by mouth 2 (two) times daily as needed for anxiety. 60 tablet 1  . Control Gel Formula Dressing (DUODERM CGF DRESSING) MISC Use as directed multiple times daily for gluteal pressure ulcer wound care 60 each 11  . diclofenac (VOLTAREN) 50 MG EC tablet Take 50 mg by mouth 2 (two) times daily.    . Eluxadoline (VIBERZI) 100 MG TABS     . EPINEPHrine (EPIPEN JR) 0.15 MG/0.3ML injection Inject as directed as needed.    Marland Kitchen esomeprazole (NEXIUM) 40 MG capsule     . etonogestrel (NEXPLANON) 68 MG IMPL implant Inject into the skin.    Marland Kitchen Fluticasone-Salmeterol (ADVAIR DISKUS) 250-50 MCG/DOSE AEPB Inhale 1 puff into the lungs 2 (two) times daily. 1 each 4  . gabapentin (NEURONTIN) 300 MG capsule Take 600 mg by mouth 3 (three) times daily.  1  . Hydroactive Dressings (DUODERM HYDROACTIVE) GEL Use as  directed multiple times daily for gluteal pressure ulcer wound care 30 g 11  . ibuprofen (ADVIL,MOTRIN) 800 MG tablet Take 800 mg by mouth every 8 (eight) hours as needed. for pain  3  . ketorolac (TORADOL) 30 MG/ML injection Inject 30 mg into the vein once.    Marland Kitchen levocetirizine (XYZAL) 5 MG tablet Take 1 tablet (5 mg total) by mouth every evening. 30 tablet 5  . LORazepam (ATIVAN) 2 MG tablet   0  . Multiple Vitamin (THERA) TABS Take by mouth.    . mupirocin ointment (BACTROBAN) 2 % Apply 1 application topically 3 (three) times daily as needed.  11  . nystatin (MYCOSTATIN) 100000 UNIT/ML suspension Take 5 mLs (500,000 Units total) by mouth 4 (four) times daily. 60 mL 0  . ondansetron (ZOFRAN) 4 MG tablet Take 4 mg by mouth every 8 (eight) hours as needed for nausea or vomiting.    . potassium chloride SA (K-DUR,KLOR-CON) 20 MEQ tablet Take by mouth.    . promethazine (PHENERGAN) 25 MG tablet Take 1 tablet (25 mg total) by mouth every 8 (eight) hours as needed for nausea or vomiting. 20 tablet 3  . rizatriptan (MAXALT) 5 MG tablet 1 at migraine onset and repeat after 2 hours if needed.    . sertraline (ZOLOFT) 100 MG tablet TAKE 2 TABLETS BY MOUTH ONCE DAILY 180 tablet 1  . sodium fluoride (FLUORISHIELD) 1.1 % GEL dental gel APP A PEA-SIZED AMOUNT TO TOOTHBRUSH AND BRUTH TEETH THROUGHLY FOR 2 MINUTES DAILY    . traZODone (DESYREL) 50 MG tablet Take three at bedtime for sleep.    Marland Kitchen triamcinolone cream (KENALOG) 0.1 % Apply 1 application topically 2 (two) times daily. 30 g 0  . Zinc Oxide (SECURA EX) Apply 1 application topically daily as needed (rash).     No current facility-administered medications on file prior to visit.    Allergies  Allergen Reactions  . Lidocaine Swelling    Had steroid injection doesn't know if it was the steroid or lidocaine. "Whole arm swelled & warm to touch. Severe inflamation."   . Augmentin [Amoxicillin-Pot Clavulanate] Diarrhea and Nausea And Vomiting    Has  patient had a PCN reaction causing immediate rash, facial/tongue/throat swelling, SOB or lightheadedness with hypotension: No Has patient had a PCN reaction causing severe rash involving mucus membranes or skin necrosis: No Has patient had a PCN reaction that required hospitalization: No Has patient had a PCN reaction occurring within the last 10 years: #  #  #  YES  #  #  #  If all of the above answers are "NO", then may proceed with Cephalosporin use.   . Cefdinir Diarrhea  . Codeine Nausea And Vomiting  . Lortab [Hydrocodone-Acetaminophen] Nausea And Vomiting  . Methylprednisolone Rash    Had lidocaine as well  . Other Nausea And Vomiting    general anesthesia causes severe nausea and vomiting    Family History  Problem Relation Age of Onset  . Cancer Mother        CLL, decsd in 2012  . Cerebral palsy Sister   . Breast cancer Neg Hx    Social History   Socioeconomic History  . Marital status: Single    Spouse name: Not on file  . Number of children: Not on file  . Years of education: Not on file  . Highest education level: Not on file  Occupational History  . Not on file  Social Needs  . Financial resource strain: Not on file  . Food insecurity:    Worry: Not on file    Inability: Not on file  . Transportation needs:    Medical: Not on file    Non-medical: Not on file  Tobacco Use  . Smoking status: Never Smoker  . Smokeless tobacco: Never Used  Substance and Sexual Activity  . Alcohol use: Yes    Alcohol/week: 0.0 standard drinks    Comment: occ  . Drug use: No  . Sexual activity: Not Currently  Lifestyle  . Physical activity:    Days per week: Not on file    Minutes per session: Not on file  . Stress: Not on file  Relationships  . Social connections:    Talks on phone: Not on file    Gets together: Not on file    Attends religious service: Not on file    Active member of club or organization: Not on file    Attends meetings of clubs or organizations:  Not on file    Relationship status: Not on file  Other Topics Concern  . Not on file  Social History Narrative   Pt has doctorate degree   Depression screen [facility] 2/9 01/23/2018 09/18/2017 08/05/2017 04/22/2017 04/08/2017  Decreased Interest 0 0 2 0 0  Down, Depressed, Hopeless 0 0 0 0 0  PHQ - 2 Score 0 0 2 0 0  Altered sleeping - - 2 - -  Tired, decreased energy - - 3 - -  Change in appetite - - 3 - -  Feeling bad or failure about yourself  - - 0 - -  Trouble concentrating - - 0 - -  Moving slowly or fidgety/restless - - 0 - -  Suicidal thoughts - - 0 - -  PHQ-9 Score - - 10 - -    ROS As noted in HPI  Objective:  BP 128/85 (BP Location: Right Arm, Patient Position: Sitting, Cuff Size: Large)   Pulse 90   Temp 98.2 F (36.8 C) (Oral)   Resp 14   Wt 140 lb (63.5 kg)   SpO2 96%   BMI 27.34 kg/m  Physical Exam  Constitutional: She is oriented to person, place, and time. She appears well-developed and well-nourished. No distress.  HENT:  Head: Normocephalic and atraumatic.  Right Ear: External ear and ear canal normal. Tympanic membrane is erythematous.  Left Ear: External ear and ear canal normal. A middle ear effusion (minimal) is present.  Nose: Nose normal. No mucosal edema or rhinorrhea.  Mouth/Throat: Uvula is midline, oropharynx is clear and  moist and mucous membranes are normal. No oropharyngeal exudate.  Eyes: Conjunctivae are normal. Right eye exhibits no discharge. Left eye exhibits no discharge. No scleral icterus.  Neck: Normal range of motion. Neck supple.  Cardiovascular: Normal rate, regular rhythm, normal heart sounds and intact distal pulses.  Pulmonary/Chest: Effort normal and breath sounds normal.  Lymphadenopathy:    She has no cervical adenopathy.  Neurological: She is alert and oriented to person, place, and time.  Skin: Skin is warm and dry. She is not diaphoretic. No erythema.  Psychiatric: She has a normal mood and affect. Her behavior is normal.     [facility] Office Visit on 01/23/2018  Component Date Value Ref Range Status  . WBC 01/23/2018 8.3  4.6 - 10.2 K/uL Final  . Lymph, poc 01/23/2018 1.7  0.6 - 3.4 Final  . POC LYMPH PERCENT 01/23/2018 20.8  10 - 50 %L Final  . MID (cbc) 01/23/2018 1.2* 0 - 0.9 Final  . POC MID % 01/23/2018 14.9* 0 - 12 %M Final  . POC Granulocyte 01/23/2018 5.3  2 - 6.9 Final  . Granulocyte percent 01/23/2018 64.3  37 - 80 %G Final  . RBC 01/23/2018 4.65  4.04 - 5.48 M/uL Final  . Hemoglobin 01/23/2018 12.3  9.5 - 13.5 g/dL Final  . HCT, POC 01/23/2018 37.5  29 - 41 % Final  . MCV 01/23/2018 80.6  76 - 111 fL Final  . MCH, POC 01/23/2018 26.4* 27 - 31.2 pg Final  . MCHC 01/23/2018 32.8  31.8 - 35.4 g/dL Final  . RDW, POC 01/23/2018 16.1  % Final  . Platelet Count, POC 01/23/2018 210  142 - 424 K/uL Final  . MPV 01/23/2018 7.3  0 - 99.8 fL Final  . POCT SED RATE 01/23/2018 20  0 - 22 mm/hr Final  . Glucose 01/23/2018 88  65 - 99 mg/dL Final  . BUN 01/23/2018 9  6 - 24 mg/dL Final  . Creatinine, Ser 01/23/2018 0.49* 0.57 - 1.00 mg/dL Final  . GFR calc non Af Amer 01/23/2018 120  >59 mL/min/1.73 Final  . GFR calc Af Amer 01/23/2018 138  >59 mL/min/1.73 Final  . BUN/Creatinine Ratio 01/23/2018 18  9 - 23 Final  . Sodium 01/23/2018 142  134 - 144 mmol/L Final  . Potassium 01/23/2018 3.8  3.5 - 5.2 mmol/L Final  . Chloride 01/23/2018 104  96 - 106 mmol/L Final  . CO2 01/23/2018 20  20 - 29 mmol/L Final  . Calcium 01/23/2018 8.7  8.7 - 10.2 mg/dL Final  . Total Protein 01/23/2018 6.3  6.0 - 8.5 g/dL Final  . Albumin 01/23/2018 4.0  3.5 - 5.5 g/dL Final  . Globulin, Total 01/23/2018 2.3  1.5 - 4.5 g/dL Final  . Albumin/Globulin Ratio 01/23/2018 1.7  1.2 - 2.2 Final  . Bilirubin Total 01/23/2018 0.2  0.0 - 1.2 mg/dL Final  . Alkaline Phosphatase 01/23/2018 71  39 - 117 IU/L Final  . AST 01/23/2018 14  0 - 40 IU/L Final  . ALT 01/23/2018 16  0 - 32 IU/L Final     Assessment & Plan:   1. Myalgia    2. Migraine without status migrainosus, not intractable, unspecified migraine type - very occ when doesn't respond to triptan will do self inj with toradol - has done this for decades, requests refill of IM toradol 30 but knows not to use for sev days due to dose today.  Current sinusitis has triggered intractable migraine to  toradol 40m IM x 1 today  3. Acute recurrent pansinusitis  - DepoMedrol 1264mIM x 1 today and zpack  4. Right ear impacted cerumen   5. Polypharmacy   6. Long-term use of high-risk medication    Had biopsy by gyn which showed tumor so she is sched to have this removed on the 10th by Dr. PrKennon Roundsb-gyn.  Patient will continue on current chronic medications other than changes noted above, so ok to refill when needed.   See after visit summary for patient specific instructions.  Orders Placed This Encounter  Procedures  . Comprehensive metabolic panel  . Ear wax removal  . POCT CBC  . POCT SEDIMENTATION RATE    Meds ordered this encounter  Medications  . ketorolac (TORADOL) injection 60 mg  . methylPREDNISolone acetate (DEPO-MEDROL) injection 120 mg  . azithromycin (ZITHROMAX) 250 MG tablet    Sig: Take 2 tabs PO x 1 dose, then 1 tab PO QD x 4 days    Dispense:  6 tablet    Refill:  0  . benzonatate (TESSALON) 200 MG capsule    Sig: Take 1 capsule (200 mg total) by mouth 3 (three) times daily as needed for cough.    Dispense:  60 capsule    Refill:  1  . Albuterol Sulfate 108 (90 Base) MCG/ACT AEPB    Sig: Inhale 2 puffs into the lungs every 4 (four) hours as needed.    Dispense:  1 each    Refill:  11  . albuterol (PROVENTIL) (2.5 MG/3ML) 0.083% nebulizer solution    Sig: Take 3 mLs (2.5 mg total) by nebulization every 2 (two) hours as needed for wheezing or shortness of breath.    Dispense:  150 mL    Refill:  3  . rizatriptan (MAXALT) 5 MG tablet    Sig: 1 at migraine onset and repeat after 2 hours if needed.    Dispense:  10 tablet    Refill:  11   . predniSONE (DELTASONE) 5 MG tablet    Sig: Take 1 tablet (5 mg total) by mouth daily with breakfast. As directed by physician for asthma or urticaria flair    Dispense:  30 tablet    Refill:  0  . ketorolac (TORADOL) 30 MG/ML injection    Sig: Inject 1 mL (30 mg total) into the muscle daily as needed (severe pain). No nsaid or otc pain med other than acetaminophen within 24 hrs    Dispense:  1 mL    Refill:  3    Patient verbalized to me that they understand the following: diagnosis, what is being done for them, what to expect and what should be done at home.  Their questions have been answered. They understand that I am unable to predict every possible medication interaction or adverse outcome and that if any unexpected symptoms arise, they should contact [facility] their pharmacist, as well as never hesitate to seek urgent[facility] or [facility] if they think it might be warranted.    EvDelman CheadleMD, MPH [facility] [place]  [phone] phone  [phone] fax  01/23/18 11:00 AM

## 2018-01-23 NOTE — Patient Instructions (Signed)
° ° ° °  If you have lab work done today you will be contacted with your lab results within the next 2 weeks.  If you have not heard from us then please contact us. The fastest way to get your results is to register for My Chart. ° ° °IF you received an x-ray today, you will receive an invoice from [facility]. Please contact [facility] at [phone] with questions or concerns regarding your invoice.  ° °IF you received labwork today, you will receive an invoice from [facility]. Please contact [facility] at [phone] with questions or concerns regarding your invoice.  ° °Our billing staff will not be able to assist you with questions regarding bills from these companies. ° °You will be contacted with the lab results as soon as they are available. The fastest way to get your results is to activate your My Chart account. Instructions are located on the last page of this paperwork. If you have not heard from us regarding the results in 2 weeks, please contact this office. °  ° ° ° °

## 2018-01-24 LAB — COMPREHENSIVE METABOLIC PANEL
ALBUMIN: 4 g/dL (ref 3.5–5.5)
ALK PHOS: 71 IU/L (ref 39–117)
ALT: 16 IU/L (ref 0–32)
AST: 14 IU/L (ref 0–40)
Albumin/Globulin Ratio: 1.7 (ref 1.2–2.2)
BUN/Creatinine Ratio: 18 (ref 9–23)
BUN: 9 mg/dL (ref 6–24)
Bilirubin Total: 0.2 mg/dL (ref 0.0–1.2)
CALCIUM: 8.7 mg/dL (ref 8.7–10.2)
CHLORIDE: 104 mmol/L (ref 96–106)
CO2: 20 mmol/L (ref 20–29)
CREATININE: 0.49 mg/dL — AB (ref 0.57–1.00)
GFR calc Af Amer: 138 mL/min/{1.73_m2} (ref >59)
GFR calc non Af Amer: 120 mL/min/{1.73_m2} (ref >59)
GLUCOSE: 88 mg/dL (ref 65–99)
Globulin, Total: 2.3 g/dL (ref 1.5–4.5)
Potassium: 3.8 mmol/L (ref 3.5–5.2)
Sodium: 142 mmol/L (ref 134–144)
TOTAL PROTEIN: 6.3 g/dL (ref 6.0–8.5)

## 2018-01-26 NOTE — Telephone Encounter (Signed)
Paper work was not located

## 2018-01-27 ENCOUNTER — Encounter: Payer: Self-pay | Admitting: Family Medicine

## 2018-01-27 NOTE — Telephone Encounter (Signed)
I'm not sure what the problem is - the rx makes since to me -  Rx is for ketorolac 59m/ml so pt is to inject 334m= 81m92maily as needed for severe pain - however, this happens rarely so I didn't think pt should need > 1 vial = 1 mL at a time.  Is pt requesting a 3 mo supply so wanting 3 of the 1 mL vials dispensed at a time?  I didn't want them to expire on her as uses rarely but ok to change rx to dispense 3 1 mL vials with NO refills instead if pt prefers.

## 2018-01-27 NOTE — Telephone Encounter (Signed)
Dr Brigitte Pulse please advise. Dgaddy, CMA

## 2018-01-30 ENCOUNTER — Other Ambulatory Visit: Payer: Self-pay | Admitting: Family Medicine

## 2018-01-30 NOTE — Telephone Encounter (Signed)
Rushie Goltz, Software engineer at [employer]. rx has been filled and pickup by pt as of today. Per pharmacist stated, "she went on and gave pt ketorolac 1 ml."

## 2018-01-31 NOTE — Telephone Encounter (Signed)
Requested medication (s) are due for refill today: Yes  Requested medication (s) are on the active medication list: Diflucan not on list  Last refill:  Unkown  Future visit scheduled: Yes  Notes to clinic:  Neurontin - historical provider    Requested Prescriptions  Pending Prescriptions Disp Refills   gabapentin (NEURONTIN) 300 MG capsule [Pharmacy Med Name: GABAPENTIN 300MG    CAP] 540 capsule 1    Sig: TAKE 2 [place]     Neurology: Anticonvulsants - gabapentin Passed - 01/30/2018 12:34 PM      Passed - Valid encounter within last 12 months    Recent Outpatient Visits          1 week ago Myalgia   [facility] at [facility], Laurey Arrow, MD   1 month ago Myalgia   Primary Care at [facility], Arlie Solomons, MD   2 months ago Dysuria   [facility] at [facility], Lilia Argue, MD   3 months ago Acute upper respiratory infection   [facility] at [facility], Lilia Argue, MD   4 months ago Otalgia, bilateral   Primary Care at [facility], Ines Bloomer, MD      Future Appointments            In 1 month Shawnee Knapp, MD Primary Care at [place], [place]          fluconazole (DIFLUCAN) 150 MG tablet [Pharmacy Med Name: FLUCONAZOLE 150MG   TAB] 2 tablet 1    Sig: TAKE 1 TABLET BY MOUTH NOW. REPEAT IN 3 DAYS AS NEEDED     Off-Protocol Failed - 01/30/2018 12:34 PM      Failed - Medication not assigned to a protocol, review manually.      Passed - Valid encounter within last 12 months    Recent Outpatient Visits          1 week ago Myalgia   [facility] at [facility], Laurey Arrow, MD   1 month ago Myalgia   Primary Care at [facility], Arlie Solomons, MD   2 months ago Dysuria   [facility] at [facility], Lilia Argue, MD   3 months ago Acute upper respiratory infection   [facility] at [facility], Lilia Argue, MD   4 months ago Otalgia, bilateral   Primary Care at [facility], Ines Bloomer, MD      Future Appointments            In 1 month Brigitte Pulse Laurey Arrow, MD Primary Care at [place], [facility]

## 2018-01-31 NOTE — Telephone Encounter (Signed)
Please advise 

## 2018-02-05 MED ORDER — ONDANSETRON HCL 4 MG PO TABS: 4.0000 mg | ORAL_TABLET | Freq: Three times a day (TID) | ORAL | 2 refills | Status: DC | PRN

## 2018-02-05 MED ORDER — GABAPENTIN 300 MG PO CAPS: 600.0000 mg | ORAL_CAPSULE | Freq: Three times a day (TID) | ORAL | 1 refills | Status: AC

## 2018-02-05 MED ORDER — FLUCONAZOLE 150 MG PO TABS: 150.0000 mg | ORAL_TABLET | Freq: Once | ORAL | 2 refills | Status: AC

## 2018-02-05 NOTE — Telephone Encounter (Signed)
Sent in and pt informed over mychart

## 2018-02-06 ENCOUNTER — Encounter: Payer: Self-pay | Admitting: Family Medicine

## 2018-02-06 ENCOUNTER — Encounter: Payer: Self-pay | Admitting: *Deleted

## 2018-02-06 ENCOUNTER — Ambulatory Visit (INDEPENDENT_AMBULATORY_CARE_PROVIDER_SITE_OTHER): Payer: BLUE CROSS/BLUE SHIELD | Admitting: Family Medicine

## 2018-02-06 VITALS — BP 142/81 | HR 97

## 2018-02-06 DIAGNOSIS — D280 Benign neoplasm of vulva: Secondary | ICD-10-CM | POA: Diagnosis not present

## 2018-02-06 DIAGNOSIS — D239 Other benign neoplasm of skin, unspecified: Secondary | ICD-10-CM | POA: Insufficient documentation

## 2018-02-06 NOTE — Progress Notes (Signed)
   Subjective:    Patient ID: Carolyn Pena is a 42 y.o. female presenting with Follow-up  on 02/06/2018  HPI: Here today for removal of vulvar lesion. She had a punch biopsy which revealed this to be a benign tumor, treated by local excision. She desired in office procedure. She is allergic to lidocaine and we have marcaine today.  Review of Systems  Constitutional: Negative for chills and fever.  Respiratory: Negative for shortness of breath.   Cardiovascular: Negative for chest pain.  Gastrointestinal: Negative for abdominal pain, nausea and vomiting.  Genitourinary: Negative for dysuria.  Skin: Negative for rash.      Objective:    BP (!) 142/81   Pulse 97  Physical Exam  Constitutional: She is oriented to person, place, and time. She appears well-developed and well-nourished. No distress.  HENT:  Head: Normocephalic and atraumatic.  Eyes: No scleral icterus.  Neck: Neck supple.  Cardiovascular: Normal rate.  Pulmonary/Chest: Effort normal.  Abdominal: Soft.  Neurological: She is alert and oriented to person, place, and time.  Skin: Skin is warm and dry.  Small 1 cm circumferential area with raised white border at introitus  Psychiatric: She has a normal mood and affect.   Procedure: After informed consent, area cleaned with alcohol.  3 cc of Marcaine infiltrated.  Scalpel used to make a small elliptical incision around lesion with removal of superficial area. Pressure + AgNO3 used to achieve hemostasis. 3cc of Marcaine infiltrated at end.     Assessment & Plan:   Problem List Items Addressed This Visit      Unprioritized   Papillary hidradenoma - Primary    S/p excision. Will contact with pathology results. Keep area clean, difficult, but use peri-bottle.      Relevant Orders   Surgical pathology( [place]/ [facility])      Return in about 2 weeks (around 02/20/2018) for a follow-up.  Carolyn Pena 02/06/2018 3:14 PM

## 2018-02-06 NOTE — Patient Instructions (Signed)
Excision of Skin Lesions, Care After Refer to this sheet in the next few weeks. These instructions provide you with information about caring for yourself after your procedure. Your health care provider may also give you more specific instructions. Your treatment has been planned according to current medical practices, but problems sometimes occur. Call your health care provider if you have any problems or questions after your procedure. What can I expect after the procedure? After your procedure, it is common to have pain or discomfort at the excision site. Follow these instructions at home:  Take over-the-counter and prescription medicines only as told by your health care provider.  Follow instructions from your health care provider about: ? How to take care of your excision site. You should keep the site clean, dry, and protected for at least 48 hours. ? When and how you should change your bandage (dressing). ? When you should remove your dressing. ? Removing whatever was used to close your excision site.  Check the excision area every day for signs of infection. Watch for: ? Redness, swelling, or pain. ? Fluid, blood, or pus.  For bleeding, apply gentle but firm pressure to the area using a folded towel for 20 minutes.  Avoid high-impact exercise and activities until the stitches (sutures) are removed or the area heals.  Follow instructions from your health care provider about how to minimize scarring. Avoid sun exposure until the area has healed. Scarring should lessen over time.  Keep all follow-up visits as told by your health care provider. This is important. Contact a health care provider if:  You have a fever.  You have redness, swelling, or pain at the excision site.  You have fluid, blood, or pus coming from the excision site.  You have ongoing bleeding at the excision site.  You have pain that does not improve in 2-3 days after your procedure.  You notice skin  irregularities or changes in sensation. This information is not intended to replace advice given to you by your health care provider. Make sure you discuss any questions you have with your health care provider. Document Released: 07/01/2014 Document Revised: 07/23/2015 Document Reviewed: 04/02/2014 Elsevier Interactive Patient Education  [employer].

## 2018-02-06 NOTE — Assessment & Plan Note (Signed)
S/p excision. Will contact with pathology results. Keep area clean, difficult, but use peri-bottle.

## 2018-02-06 NOTE — Addendum Note (Signed)
Addended by: Shawnee Knapp on: 02/06/2018 12:03 AM   Modules accepted: Orders

## 2018-02-09 ENCOUNTER — Telehealth: Payer: Self-pay | Admitting: Family Medicine

## 2018-02-09 NOTE — Telephone Encounter (Signed)
Copied from [place] [id number]. Topic: Quick Communication - See Telephone Encounter >> Feb 09, 2018 12:16 PM Percell Belt A wrote: CRM for notification. See Telephone encounter for: 02/09/18.  Pt called in and stated that she needs the dissolvable  tabs for the ondansetron (ZOFRAN) 4 MG tablet [[id number] in stead of the reg tabs..  she needs this script which from reg tabs to dissolvable .  Her ins will not cover the reg tabs   Bud on [address]

## 2018-02-10 ENCOUNTER — Other Ambulatory Visit: Payer: Self-pay

## 2018-02-10 MED ORDER — ONDANSETRON 8 MG PO TBDP: 4.0000 mg | ORAL_TABLET | Freq: Three times a day (TID) | ORAL | 0 refills | Status: DC | PRN

## 2018-02-10 NOTE — Telephone Encounter (Signed)
Prescription sent for ODT zofran -53m- 1/2 tab - Dr. SBrigitte Pulsehad ordered 463mtabs & pt needed ODT

## 2018-02-12 ENCOUNTER — Encounter: Payer: Self-pay | Admitting: Family Medicine

## 2018-02-12 ENCOUNTER — Ambulatory Visit (INDEPENDENT_AMBULATORY_CARE_PROVIDER_SITE_OTHER): Payer: BLUE CROSS/BLUE SHIELD | Admitting: Family Medicine

## 2018-02-12 VITALS — BP 135/90 | HR 96

## 2018-02-12 DIAGNOSIS — R102 Pelvic and perineal pain: Secondary | ICD-10-CM

## 2018-02-12 DIAGNOSIS — L909 Atrophic disorder of skin, unspecified: Secondary | ICD-10-CM | POA: Diagnosis not present

## 2018-02-12 DIAGNOSIS — R238 Other skin changes: Secondary | ICD-10-CM

## 2018-02-12 DIAGNOSIS — D239 Other benign neoplasm of skin, unspecified: Secondary | ICD-10-CM | POA: Diagnosis not present

## 2018-02-12 MED ORDER — CYCLOBENZAPRINE HCL 10 MG PO TABS: 10.0000 mg | ORAL_TABLET | Freq: Three times a day (TID) | ORAL | 1 refills | Status: DC | PRN

## 2018-02-12 MED ORDER — WHITE PETROLATUM EX OINT: 1.0000 "application " | TOPICAL_OINTMENT | CUTANEOUS | 3 refills | Status: DC | PRN

## 2018-02-12 NOTE — Assessment & Plan Note (Signed)
S/p removal, healing well.

## 2018-02-12 NOTE — Patient Instructions (Signed)
Care of a Perineal Tear °A perineal tear is a cut (laceration) in the tissue between the opening of the vagina and the anus (perineum). Some women naturally develop a perineal tear during a vaginal birth. This can happen as the baby emerges from the birth canal and the perineum is stretched. Perineal tears are graded based on how deep and long the laceration is. The grading for perineal tears is as follows: °· First degree. This involves a shallow tear at the edge of the vaginal opening that extends slightly into the perineal skin. °· Second degree. This involves tearing described in a first degree perineal tear and also a deeper tear of the vaginal opening and perineal tissues. It may also include tearing of a muscle just under the perineal skin. °· Third degree. This involves tearing described in a first and second degree perineal tear, with the tear extending into the muscle of the anus (anal sphincter). °· Fourth degree. This involves all levels of tear described for first, second, and third degree perineal tear, with the tear extending into the rectum. ° °First degree perineal tears may or may not be stitched closed, depending on their location and appearance. Second, third, and fourth degree perineal tears are stitched closed immediately after the baby’s birth. °What are the risks? °Depending on the type of perineal tear you have, you may be at risk for the following: °· Bleeding. °· Developing a collection of blood in the perineal tear area (hematoma). °· Pain. This may include pain with urination or bowel movements. °· Infection at the site of the tear. °· Fever. °· Trouble controlling your bowels (fecal incontinence). °· Painful sexual intercourse. ° °How to care for a perineal tear °· The first day, put ice on the area of the tear. °? Put ice in a plastic bag. °? Place a towel between your skin and the bag. °? Leave the ice on for 20 minutes, 2-3 times a day. °· Bathe using a warm sitz bath as directed by  your health care provider. This can speed up healing. Sitz baths can be performed in your bathtub or using a sitz bath kit that fits over your toilet. °? Place 3-4 in. (7.6-10 cm) of warm water in your bathtub or fill the sitz bath over-the-toilet container with warm water. Make sure the water is not too hot by placing a drop on your wrist. °? Sit in the warm water for 20-30 minutes. °? After bathing, pat your perineum dry with a clean towel. Do not scrub the perineum as this could cause pain, irritation, or open any stitches you may have. °? Keep the over-the-toilet sitz bath container clean by rinsing it thoroughly after each use. Ask for help in keeping the bathtub clean with diluted bleach and water (2 Tbsp [30 mL] of bleach to ½ gal [1.9 L] of water). °? Repeat the sitz bath as often as you would like to relieve perineal pain, itching, or discomfort. °· Apply a numbing spray to the perineal tear site as directed by your health care provider. This may help with discomfort. °· Wash your hands before and after applying medicine to the area. °· Put about 3 witch hazel-containing hemorrhoid treatment pads on top of your sanitary pad. The witch hazel in the hemorrhoid pads helps with discomfort and swelling. °· Get a squeeze bottle to squeeze warm water on your perineum when urinating, spraying the area from front to back. Pat the area to dry it. °· Sitting on an inflatable   ring or pillow may provide comfort.  Take medicines only as directed by your health care provider.  Do not have sexual intercourse or use tampons until your health care provider says it is okay. Typically, you must wait at least 6 weeks.  Keep all postpartum appointments as directed by your health care provider. Contact a health care provider if:  Your pain is not relieved with medicines.  You have painful urination.  You have a fever. Get help right away if:  You have redness, swelling, or increasing pain in the area of the  tear.  You have pus coming from the area of the tear.  You notice a bad smell coming from the area of the tear.  Your tear opens.  You notice swelling in the area of the tear that is larger than when you left the hospital.  You cannot urinate. This information is not intended to replace advice given to you by your health care provider. Make sure you discuss any questions you have with your health care provider. Document Released: 07/01/2013 Document Revised: 07/29/2015 Document Reviewed: 11/20/2012 Elsevier Interactive Patient Education  2017 [employer].

## 2018-02-12 NOTE — Assessment & Plan Note (Signed)
Has known right ovarian cyst--check pelvic sono

## 2018-02-12 NOTE — Progress Notes (Signed)
   Subjective:    Patient ID: Carolyn Pena is a 42 y.o. female presenting with Wound Check  on 02/12/2018  HPI: Here today for f/u. Had a benign growth removed. Has had less care for help with perineal care in last few days. Trying to keep it clean. Bleeding has resolved. Pain is ok. Also notes worsening dysmenorrhea. Has Nexplanon in x 2 years almost and cycles were not that bad before. Has known ovarian cyst and this appears to have been present since 2004. Essentially 3 cm in MRI in 2012 and CT 2016 and CT 10/2017. Has not had pelvic sonogram with characterization of cysts or to look at uterus, lining.  Review of Systems  Constitutional: Negative for chills and fever.  Respiratory: Negative for shortness of breath.   Cardiovascular: Negative for chest pain.  Gastrointestinal: Negative for abdominal pain, nausea and vomiting.  Genitourinary: Negative for dysuria.  Skin: Negative for rash.      Objective:    BP 135/90   Pulse 96  Physical Exam Constitutional:      General: She is not in acute distress.    Appearance: She is well-developed.  HENT:     Head: Normocephalic and atraumatic.  Eyes:     General: No scleral icterus. Neck:     Musculoskeletal: Neck supple.  Cardiovascular:     Rate and Rhythm: Normal rate.  Pulmonary:     Effort: Pulmonary effort is normal.  Abdominal:     Palpations: Abdomen is soft.  Genitourinary:   Skin:    General: Skin is warm and dry.  Neurological:     Mental Status: She is alert and oriented to person, place, and time.         Assessment & Plan:   Problem List Items Addressed This Visit      Unprioritized   Papillary hidradenoma    S/p removal, healing well.      Pelvic pain - Primary    Has known right ovarian cyst--check pelvic sono      Relevant Medications   cyclobenzaprine (FLEXERIL) 10 MG tablet   Other Relevant Orders   US PELVIC COMPLETE WITH TRANSVAGINAL    Other Visit Diagnoses    Skin breakdown         Relevant Medications   white petrolatum (VASELINE) OINT      Total face-to-face time with patient: 15 minutes. Over 50% of encounter was spent on counseling and coordination of care. Return in about 2 weeks (around 02/26/2018).  Donnamae Jude 02/12/2018 5:22 PM

## 2018-02-13 ENCOUNTER — Encounter: Payer: Self-pay | Admitting: Radiology

## 2018-02-26 ENCOUNTER — Ambulatory Visit (HOSPITAL_COMMUNITY): Admission: RE | Admit: 2018-02-26 | Payer: BLUE CROSS/BLUE SHIELD | Source: Ambulatory Visit

## 2018-03-06 ENCOUNTER — Telehealth: Payer: Self-pay | Admitting: Family Medicine

## 2018-03-08 ENCOUNTER — Encounter: Payer: Self-pay | Admitting: Family Medicine

## 2018-03-15 ENCOUNTER — Ambulatory Visit: Payer: Self-pay | Admitting: Family Medicine

## 2018-03-16 ENCOUNTER — Ambulatory Visit: Payer: Self-pay

## 2018-03-16 ENCOUNTER — Encounter: Payer: Self-pay | Admitting: Family Medicine

## 2018-03-16 NOTE — Telephone Encounter (Signed)
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 Reports skin looks "burned." Appointment made for tomorrow.  Reason for Disposition . [1] Looks infected (spreading redness, pus) AND [2] no fever  Answer Assessment - Initial Assessment Questions 1. APPEARANCE of INJURY: "What does the injury look like?"      Skin looks "burned" 2. SIZE: "How large is the cut?"      Back of both legs from knees up 3. BLEEDING: "Is it bleeding now?" If so, ask: "Is it difficult to stop?"      No 4. LOCATION: "Where is the injury located?"      Back of legs 5. ONSET: "How long ago did the injury occur?"      This week 6. MECHANISM: "Tell me how it happened."      Had no peri care x 2 weeks  7. TETANUS: "When was the last tetanus booster?"     Unsure 8. PREGNANCY: "Is there any chance you are pregnant?" "When was your last menstrual period?"     Yes - no pregnancy  Protocols used: SKIN INJURY-A-AH

## 2018-03-17 ENCOUNTER — Other Ambulatory Visit: Payer: Self-pay

## 2018-03-17 ENCOUNTER — Encounter: Payer: Self-pay | Admitting: Family Medicine

## 2018-03-17 ENCOUNTER — Ambulatory Visit (INDEPENDENT_AMBULATORY_CARE_PROVIDER_SITE_OTHER): Payer: BLUE CROSS/BLUE SHIELD | Admitting: Family Medicine

## 2018-03-17 VITALS — BP 100/70 | HR 79 | Temp 99.0°F

## 2018-03-17 DIAGNOSIS — L22 Diaper dermatitis: Secondary | ICD-10-CM | POA: Diagnosis not present

## 2018-03-17 DIAGNOSIS — Z993 Dependence on wheelchair: Secondary | ICD-10-CM | POA: Diagnosis not present

## 2018-03-17 MED ORDER — NYSTATIN 100000 UNIT/GM EX CREA: 1.0000 "application " | TOPICAL_CREAM | Freq: Two times a day (BID) | CUTANEOUS | 0 refills | Status: DC

## 2018-03-17 NOTE — Patient Instructions (Addendum)
Continue dressing changes Use nystatin twice a day    If you have lab work done today you will be contacted with your lab results within the next 2 weeks.  If you have not heard from [place] then please contact us. The fastest way to get your results is to register for My Chart.   IF you received an x-ray today, you will receive an invoice from [facility]. Please contact [facility] at [phone] with questions or concerns regarding your invoice.   IF you received labwork today, you will receive an invoice from [place]. Please contact [facility] at [phone] with questions or concerns regarding your invoice.   Our billing staff will not be able to assist you with questions regarding bills from these companies.  You will be contacted with the lab results as soon as they are available. The fastest way to get your results is to activate your My Chart account. Instructions are located on the last page of this paperwork. If you have not heard from [place] regarding the results in 2 weeks, please contact this office.      Rash, Adult A rash is a change in the color of your skin. A rash can also change the way your skin feels. There are many different conditions and factors that can cause a rash. Some rashes may disappear after a few days, but some may last for a few weeks. Common causes of rashes include:  Viral infections, such as: ? Colds. ? Measles. ? Hand, foot, and mouth disease.  Bacterial infections, such as: ? Scarlet fever. ? Impetigo.  Fungal infections, such as Candida.  Allergic reactions to food, medicines, or skin care products. Follow these instructions at home: The goal of treatment is to stop the itching and keep the rash from spreading. Pay attention to any changes in your symptoms. Follow these instructions to help with your condition: Medicine Take or apply over-the-counter and prescription medicines only as told by your health care provider. These  may include:  Corticosteroid creams to treat red or swollen skin.  Anti-itch lotions.  Oral allergy medicines (antihistamines).  Oral corticosteroids for severe symptoms.  Skin care  Apply cool compresses to the affected areas.  Do not scratch or rub your skin.  Avoid covering the rash. Make sure the rash is exposed to air as much as possible. Managing itching and discomfort  Avoid hot showers or baths, which can make itching worse. A cold shower may help.  Try taking a bath with: ? Epsom salts. Follow manufacturer instructions on the packaging. You can get these at your local pharmacy or grocery store. ? Baking soda. Pour a small amount into the bath as told by your health care provider. ? Colloidal oatmeal. Follow manufacturer instructions on the packaging. You can get this at your local pharmacy or grocery store.  Try applying baking soda paste to your skin. Stir water into baking soda until it reaches a paste-like consistency.  Try applying calamine lotion. This is an over-the-counter lotion that helps to relieve itchiness.  Keep cool and out of the sun. Sweating and being hot can make itching worse. General instructions   Rest as needed.  Drink enough fluid to keep your urine pale yellow.  Wear loose-fitting clothing.  Avoid scented soaps, detergents, and perfumes. Use gentle soaps, detergents, perfumes, and other cosmetic products.  Avoid any substance that causes your rash. Keep a journal to help track what causes your rash. Write down: ? What you eat. ? What  cosmetic products you use. ? What you drink. ? What you wear. This includes jewelry.  Keep all follow-up visits as told by your health care provider. This is important. Contact a health care provider if:  You sweat at night.  You lose weight.  You urinate more than normal.  You urinate less than normal, or you notice that your urine is a darker color than usual.  You feel weak.  You  vomit.  Your skin or the whites of your eyes look yellow (jaundice).  Your skin: ? Tingles. ? Is numb.  Your rash: ? Does not go away after several days. ? Gets worse.  You are: ? Unusually thirsty. ? More tired than normal.  You have: ? New symptoms. ? Pain in your abdomen. ? A fever. ? Diarrhea. Get help right away if you:  Have a fever and your symptoms suddenly get worse.  Develop confusion.  Have a severe headache or a stiff neck.  Have severe joint pains or stiffness.  Have a seizure.  Develop a rash that covers all or most of your body. The rash may or may not be painful.  Develop blisters that: ? Are on top of the rash. ? Grow larger or grow together. ? Are painful. ? Are inside your nose or mouth.  Develop a rash that: ? Looks like purple pinprick-sized spots all over your body. ? Has a "bull's eye" or looks like a target. ? Is not related to sun exposure, is red and painful, and causes your skin to peel. Summary  A rash is a change in the color of your skin. Some rashes disappear after a few days, but some may last for a few weeks.  The goal of treatment is to stop the itching and keep the rash from spreading.  Take or apply over-the-counter and prescription medicines only as told by your health care provider.  Contact a health care provider if you have new or worsening symptoms.  Keep all follow-up visits as told by your health care provider. This is important. This information is not intended to replace advice given to you by your health care provider. Make sure you discuss any questions you have with your health care provider. Document Released: 02/04/2002 Document Revised: 09/18/2017 Document Reviewed: 09/18/2017 Elsevier Interactive Patient Education  2019 [employer].

## 2018-03-17 NOTE — Progress Notes (Signed)
Established Patient Office Visit  Subjective:  Patient ID: Carolyn Pena, female    DOB: [birth year]  Age: 43 y.o. MRN: [id number]  CC:  Chief Complaint  Patient presents with  . possible ulcer    on bottom to the back of the knee. Skin break down (left is worse than right)    HPI Carolyn Pena presents for   Diaper rash and skin break down on her thighs She states that was unable to change her diaper and had to sleep in her chair She now states that she was having a burning rash She states that she has a new home care company [facility]   She denies fevers, chills   Past Medical History:  Diagnosis Date  . Allergy   . Anemia   . Anxiety   . Arthritis   . Asthma   . Chronic migraine without aura without status migrainosus, not intractable 03/10/2015  . Chronic paraplegia ([place])   . Complication of anesthesia   . CP (cerebral palsy) ([place])   . CRPS (complex regional pain syndrome), upper limb 02/28/2015  . Depression   . Family history of adverse reaction to anesthesia    sister- severe n/v  . GERD (gastroesophageal reflux disease)   . H/O multiple concussions   . Headache   . Hearing loss 10/11/2016  . Neuromuscular disorder ([place])    CP  . PONV (postoperative nausea and vomiting)    Severe  . PTSD (post-traumatic stress disorder)   . UTI (urinary tract infection)    hospitalized    Past Surgical History:  Procedure Laterality Date  . achilles release Bilateral   . EYE SURGERY    . HAMSTRING RELEASE Bilateral   . HIP SURGERY Bilateral   . JOINT REPLACEMENT    . PHOTOCOAGULATION WITH LASER Bilateral 07/03/2017   Procedure: LASER DEMARCATION BILATERAL EYES;  Surgeon: Jalene Mullet, MD;  Location: [place];  Service: Ophthalmology;  Laterality: Bilateral;  . STRABISMUS SURGERY     2008, 1999    Family History  Problem Relation Age of Onset  . Cancer Mother        CLL, decsd in 2012  . Cerebral palsy Sister   . Breast cancer Neg Hx     Social History    Socioeconomic History  . Marital status: Single    Spouse name: Not on file  . Number of children: Not on file  . Years of education: Not on file  . Highest education level: Not on file  Occupational History  . Not on file  Social Needs  . Financial resource strain: Not on file  . Food insecurity:    Worry: Not on file    Inability: Not on file  . Transportation needs:    Medical: Not on file    Non-medical: Not on file  Tobacco Use  . Smoking status: Never Smoker  . Smokeless tobacco: Never Used  Substance and Sexual Activity  . Alcohol use: Yes    Alcohol/week: 0.0 standard drinks    Comment: occ  . Drug use: No  . Sexual activity: Not Currently  Lifestyle  . Physical activity:    Days per week: Not on file    Minutes per session: Not on file  . Stress: Not on file  Relationships  . Social connections:    Talks on phone: Not on file    Gets together: Not on file    Attends religious service: Not on file  Active member of club or organization: Not on file    Attends meetings of clubs or organizations: Not on file    Relationship status: Not on file  . Intimate partner violence:    Fear of current or ex partner: Not on file    Emotionally abused: Not on file    Physically abused: Not on file    Forced sexual activity: Not on file  Other Topics Concern  . Not on file  Social History Narrative   Pt has doctorate degree    Outpatient Medications Prior to Visit  Medication Sig Dispense Refill  . albuterol (PROVENTIL) (2.5 MG/3ML) 0.083% nebulizer solution Take 3 mLs (2.5 mg total) by nebulization every 2 (two) hours as needed for wheezing or shortness of breath. 150 mL 3  . Albuterol Sulfate 108 (90 Base) MCG/ACT AEPB Inhale 2 puffs into the lungs every 4 (four) hours as needed. 1 each 11  . benzonatate (TESSALON) 200 MG capsule Take 1 capsule (200 mg total) by mouth 3 (three) times daily as needed for cough. 60 capsule 1  . busPIRone (BUSPAR) 5 MG tablet TAKE  1 TABLET BY MOUTH TWICE DAILY    . butalbital-acetaminophen-caffeine (FIORICET, ESGIC) 50-325-40 MG tablet TAKE 1 TABLET BY MOUTH EVERY 4 HOURS AS NEEDED FOR HEADACHE 28 tablet 1  . clonazePAM (KLONOPIN) 1 MG tablet Take 1 tablet (1 mg total) by mouth 2 (two) times daily as needed for anxiety. 60 tablet 1  . Control Gel Formula Dressing (DUODERM CGF DRESSING) MISC Use as directed multiple times daily for gluteal pressure ulcer wound care 60 each 11  . cyclobenzaprine (FLEXERIL) 10 MG tablet Take 1 tablet (10 mg total) by mouth every 8 (eight) hours as needed for muscle spasms. 30 tablet 1  . diclofenac (VOLTAREN) 50 MG EC tablet Take 50 mg by mouth 2 (two) times daily.    . Eluxadoline (VIBERZI) 100 MG TABS     . EPINEPHrine (EPIPEN JR) 0.15 MG/0.3ML injection Inject as directed as needed.    Marland Kitchen esomeprazole (NEXIUM) 40 MG capsule     . etonogestrel (NEXPLANON) 68 MG IMPL implant Inject into the skin.    Marland Kitchen Fluticasone-Salmeterol (ADVAIR DISKUS) 250-50 MCG/DOSE AEPB Inhale 1 puff into the lungs 2 (two) times daily. 1 each 4  . gabapentin (NEURONTIN) 300 MG capsule Take 2 capsules (600 mg total) by mouth 3 (three) times daily. 540 capsule 1  . Hydroactive Dressings (DUODERM HYDROACTIVE) GEL Use as directed multiple times daily for gluteal pressure ulcer wound care 30 g 11  . ibuprofen (ADVIL,MOTRIN) 800 MG tablet Take 800 mg by mouth every 8 (eight) hours as needed. for pain  3  . ketorolac (TORADOL) 30 MG/ML injection Inject 1 mL (30 mg total) into the muscle daily as needed (severe pain). No nsaid or otc pain med other than acetaminophen within 24 hrs 1 mL 3  . levocetirizine (XYZAL) 5 MG tablet Take 1 tablet (5 mg total) by mouth every evening. 30 tablet 5  . Multiple Vitamin (THERA) TABS Take by mouth.    . mupirocin ointment (BACTROBAN) 2 % Apply 1 application topically 3 (three) times daily as needed.  11  . nystatin (MYCOSTATIN) 100000 UNIT/ML suspension Take 5 mLs (500,000 Units total) by  mouth 4 (four) times daily. 60 mL 0  . ondansetron (ZOFRAN) 4 MG tablet Take 1 tablet (4 mg total) by mouth every 8 (eight) hours as needed for nausea or vomiting. 20 tablet 2  . ondansetron (ZOFRAN-ODT) 8  MG disintegrating tablet Take 0.5 tablets (4 mg total) by mouth every 8 (eight) hours as needed for nausea. 30 tablet 0  . potassium chloride SA (K-DUR,KLOR-CON) 20 MEQ tablet Take by mouth.    . predniSONE (DELTASONE) 5 MG tablet Take 1 tablet (5 mg total) by mouth daily with breakfast. As directed by physician for asthma or urticaria flair 30 tablet 0  . promethazine (PHENERGAN) 25 MG tablet Take 1 tablet (25 mg total) by mouth every 8 (eight) hours as needed for nausea or vomiting. 20 tablet 3  . rizatriptan (MAXALT) 5 MG tablet 1 at migraine onset and repeat after 2 hours if needed. 10 tablet 11  . sertraline (ZOLOFT) 100 MG tablet TAKE 2 TABLETS BY MOUTH ONCE DAILY 180 tablet 1  . sodium fluoride (FLUORISHIELD) 1.1 % GEL dental gel APP A PEA-SIZED AMOUNT TO TOOTHBRUSH AND BRUTH TEETH THROUGHLY FOR 2 MINUTES DAILY    . traZODone (DESYREL) 50 MG tablet Take three at bedtime for sleep.    Marland Kitchen triamcinolone cream (KENALOG) 0.1 % Apply 1 application topically 2 (two) times daily. 30 g 0  . white petrolatum (VASELINE) OINT Apply 1 application topically as needed (barrier for preventive care). 500 g 3  . Zinc Oxide (SECURA EX) Apply 1 application topically daily as needed (rash).    Marland Kitchen azithromycin (ZITHROMAX) 250 MG tablet Take 2 tabs PO x 1 dose, then 1 tab PO QD x 4 days 6 tablet 0  . LORazepam (ATIVAN) 2 MG tablet   0   No facility-administered medications prior to visit.     Allergies  Allergen Reactions  . Lidocaine Swelling    Had steroid injection doesn't know if it was the steroid or lidocaine. "Whole arm swelled & warm to touch. Severe inflamation."   . Augmentin [Amoxicillin-Pot Clavulanate] Diarrhea and Nausea And Vomiting    Has patient had a PCN reaction causing immediate rash,  facial/tongue/throat swelling, SOB or lightheadedness with hypotension: No Has patient had a PCN reaction causing severe rash involving mucus membranes or skin necrosis: No Has patient had a PCN reaction that required hospitalization: No Has patient had a PCN reaction occurring within the last 10 years: #  #  #  YES  #  #  #  If all of the above answers are "NO", then may proceed with Cephalosporin use.   . Cefdinir Diarrhea  . Codeine Nausea And Vomiting  . Lortab [Hydrocodone-Acetaminophen] Nausea And Vomiting  . Methylprednisolone Rash    Had lidocaine as well  . Other Nausea And Vomiting    general anesthesia causes severe nausea and vomiting     ROS Review of Systems    Objective:    Physical Exam  BP 100/70 (BP Location: Right Arm, Patient Position: Sitting, Cuff Size: Large)   Pulse 79   Temp 99 F (37.2 C) (Oral)   SpO2 95%  Wt Readings from Last 3 Encounters:  01/23/18 140 lb (63.5 kg)  12/26/17 135 lb (61.2 kg)  07/03/17 140 lb 3.4 oz (63.6 kg)   Physical Exam  Constitutional: Oriented to person, place, and time. Appears well-developed and well-nourished.  HENT:  Head: Normocephalic and atraumatic.  Eyes: Conjunctivae and EOM are normal.  Cardiovascular: Normal rate, regular rhythm, normal heart sounds and intact distal pulses.  No murmur heard. Pulmonary/Chest: Effort normal and breath sounds normal. No stridor. No respiratory distress. Has no wheezes.  Neurological: Is alert and oriented to person, place, and time.  Psychiatric: Has a normal mood  and affect. Behavior is normal. Judgment and thought content normal.  Skin: No open wound, no ulcer visible Superficial erythema of the thigh and buttocks No bullae No vesicle No induration or fluctuance foul urine smell   Health Maintenance Due  Topic Date Due  . HIV Screening  09/14/1989    There are no preventive care reminders to display for this patient.  Lab Results  Component Value Date   TSH  1.990 08/05/2017   Lab Results  Component Value Date   WBC 8.3 01/23/2018   HGB 12.3 01/23/2018   HCT 37.5 01/23/2018   MCV 80.6 01/23/2018   PLT 225 07/03/2017   Lab Results  Component Value Date   NA 142 01/23/2018   K 3.8 01/23/2018   CO2 20 01/23/2018   GLUCOSE 88 01/23/2018   BUN 9 01/23/2018   CREATININE 0.49 (L) 01/23/2018   BILITOT 0.2 01/23/2018   ALKPHOS 71 01/23/2018   AST 14 01/23/2018   ALT 16 01/23/2018   PROT 6.3 01/23/2018   ALBUMIN 4.0 01/23/2018   CALCIUM 8.7 01/23/2018   Lab Results  Component Value Date   CHOL 195 12/03/2016   Lab Results  Component Value Date   HDL 26 (L) 12/03/2016   Lab Results  Component Value Date   LDLCALC 119 (H) 12/03/2016   Lab Results  Component Value Date   TRIG 250 (H) 12/03/2016   Lab Results  Component Value Date   CHOLHDL 7.5 (H) 12/03/2016   Lab Results  Component Value Date   HGBA1C 5.2 03/12/2015      Assessment & Plan:   Problem List Items Addressed This Visit    None    Visit Diagnoses    Diaper rash    -  Primary   Relevant Medications   nystatin cream (MYCOSTATIN)   Other Relevant Orders   Ambulatory referral to [facility]       Relevant Orders   Ambulatory referral to [facility]     Discussed wound care at home with home health member from [facility] Discussed to the patient prevention of wound ulcers  Meds ordered this encounter  Medications  . nystatin cream (MYCOSTATIN)    Sig: Apply 1 application topically 2 (two) times daily.    Dispense:  30 g    Refill:  0    Follow-up: No follow-ups on file.    Forrest Moron, MD

## 2018-03-23 ENCOUNTER — Ambulatory Visit: Payer: Self-pay | Admitting: Family Medicine

## 2018-03-23 ENCOUNTER — Telehealth: Payer: Self-pay | Admitting: Family Medicine

## 2018-03-23 NOTE — Telephone Encounter (Signed)
Scheduled appt.

## 2018-03-24 ENCOUNTER — Encounter: Payer: Self-pay | Admitting: Family Medicine

## 2018-03-24 ENCOUNTER — Other Ambulatory Visit: Payer: Self-pay

## 2018-03-24 ENCOUNTER — Ambulatory Visit (INDEPENDENT_AMBULATORY_CARE_PROVIDER_SITE_OTHER): Payer: BLUE CROSS/BLUE SHIELD | Admitting: Family Medicine

## 2018-03-24 VITALS — BP 116/72 | HR 95 | Temp 99.2°F | Resp 16

## 2018-03-24 DIAGNOSIS — R509 Fever, unspecified: Secondary | ICD-10-CM

## 2018-03-24 DIAGNOSIS — J101 Influenza due to other identified influenza virus with other respiratory manifestations: Secondary | ICD-10-CM | POA: Diagnosis not present

## 2018-03-24 DIAGNOSIS — J201 Acute bronchitis due to Hemophilus influenzae: Secondary | ICD-10-CM

## 2018-03-24 DIAGNOSIS — R062 Wheezing: Secondary | ICD-10-CM

## 2018-03-24 LAB — POCT INFLUENZA A/B
Influenza A, POC: POSITIVE — AB
Influenza B, POC: NEGATIVE

## 2018-03-24 MED ORDER — IPRATROPIUM BROMIDE 0.02 % IN SOLN
0.5000 mg | Freq: Once | RESPIRATORY_TRACT | Status: AC
  Administered 2018-03-24: 0.5 mg via RESPIRATORY_TRACT

## 2018-03-24 MED ORDER — ALBUTEROL SULFATE (2.5 MG/3ML) 0.083% IN NEBU
2.5000 mg | INHALATION_SOLUTION | Freq: Once | RESPIRATORY_TRACT | Status: AC
  Administered 2018-03-24: 2.5 mg via RESPIRATORY_TRACT

## 2018-03-24 MED ORDER — IPRATROPIUM-ALBUTEROL 0.5-2.5 (3) MG/3ML IN SOLN: 3.0000 mL | Freq: Four times a day (QID) | RESPIRATORY_TRACT | Status: DC

## 2018-03-24 MED ORDER — PREDNISONE 20 MG PO TABS: 40.0000 mg | ORAL_TABLET | Freq: Every day | ORAL | 0 refills | Status: AC

## 2018-03-24 NOTE — Patient Instructions (Addendum)
Take prednisone for 5 days Continue the cough meds - promethazine, tessalon, tylenol Finish the zpak Take your albuterol nebulizer every six hours  Stay home until fever free  Work note given for you to remain out of work until Wednesday     If you have lab work done today you will be contacted with your lab results within the next 2 weeks.  If you have not heard from [place] then please contact us. The fastest way to get your results is to register for My Chart.   IF you received an x-ray today, you will receive an invoice from [facility]. Please contact [facility] at [phone] with questions or concerns regarding your invoice.   IF you received labwork today, you will receive an invoice from [facility]. Please contact [facility] at [phone] with questions or concerns regarding your invoice.   Our billing staff will not be able to assist you with questions regarding bills from these companies.  You will be contacted with the lab results as soon as they are available. The fastest way to get your results is to activate your My Chart account. Instructions are located on the last page of this paperwork. If you have not heard from [place] regarding the results in 2 weeks, please contact this office.     H1N1 Influenza H1N1 flu (influenza H1N1) is a respiratory illness caused by the H1N1 virus. A respiratory illness affects parts of the body that are involved in breathing, including the nose, throat, and lungs. H1N1 flu is a variety of influenza A. It was first called swine flu because it was like a virus that commonly affects pigs. H1N1 flu symptoms may be slightly more severe than symptoms that are caused by other types of seasonal flu. The first outbreak of H1N1 flu in people occurred in 2009. After the virus spread from pigs to people, it eventually changed into the type of virus that can pass from person to person (is contagious), like other flu viruses.You cannot get the  virus by eating pork. What are the causes? H1N1 flu is caused by the H1N1 virus. You can catch the virus by:  Breathing in droplets from an infected person's cough or sneeze (respiratory secretions).  Touching something that has been exposed to the virus (has beencontaminated) and then touching your mouth, nose, or eyes. What increases the risk? You may be at higher risk for H1N1 flu if you:  Are age 66 or older.  Are younger than age 76.  Live in a nursing home or hospital.  Are a young adult on aspirin therapy.  Have a weak disease-fighting system (immune system).  Are pregnant.  Have a long-term (chronic) disease. What are the signs or symptoms? Signs and symptoms may start 3-5 days after infection. They may include:  Fever.  Chills.  Muscle aches (myalgia).  Tiredness.  Loss of appetite.  Cough.  Runny nose.  Sore throat.  Headache.  Nausea.  Vomiting or diarrhea. (These symptoms are less common.) How is this diagnosed? This condition may be diagnosed based on your symptoms, your medical history, and a physical exam. In some cases, a swab of fluid from your nose or throat may be tested for the H1N1 virus. How is this treated? H1N1 flu usually goes away in 4-8 days without treatment. Taking care of yourself at home may be all that you need to do during this time. Your health care provider may recommend taking over-the-counter medicines, drinking plenty of fluids, and adding humidity  to the air in your home. If you are at risk for severe flu, you may be treated with antiviral medicine. If this medicine is taken soon enough, it can shorten the illness and make it less severe. Follow these instructions at home: Medicines  Take over-the-counter and prescription medicines only as told by your health care provider.  If you were prescribed antiviral medicine, take it as told by your health care provider. Do not stop taking it even if you start to feel  better.  Do not give aspirin to a child with the flu, because of the association with Reye syndrome. Activity  Rest at home while you recover.  Prevent spreading H1N1 flu to others. Take the following steps until your fever has been gone for 24 hours without the use of medicine: ? Avoid leaving home. Stay home from work or school. ? Stay away from pregnant women, the elderly, young children, and people with chronic medical conditions. General instructions      Use a cool-mist humidifier to add humidity to the air in your home. This can make breathing easier.  Cover your mouth and nose when you cough or sneeze.  Do not prepare food for others while you are infected.  Wash your hands with soap and water often, especially after you cough or sneeze. If soap and water are not available, use hand sanitizer.  Make sure that all people in your household wash their hands well and often.  Drink enough fluid to keep your urine pale yellow.  Keep all follow-up visits as told by your health care provider. This is important. How is this prevented?  Getting a yearly (annual) flu shot is the best way to prevent the flu. You may get the flu shot in late summer, fall, or winter. Ask your health care provider when you should get your flu shot.  Wash your hands with soap and water often, especially after you cough or sneeze. If soap and water are not available, use hand sanitizer.  Avoid contact with people who are sick during cold and flu season. Wear a mask to protect yourself when you are around people who are sick or might be sick.  Eat a healthy diet, drink plenty of fluids, get enough sleep, and exercise regularly.  Do not touch your face if you have not cleaned your hands. Contact a health care provider if:  You develop new symptoms.  You have severe symptoms that do not get better with home care.  You have flu symptoms: ? That last longer than one week. ? That come back. Get help  right away if:  You have trouble breathing.  You have chest pain.  You cough up blood or yellow or gray fluid.  You feel dizzy or you faint.  You feel confused.  You lack energy (are lethargic). Summary  H1N1 flu (influenza H1N1) is a respiratory illness caused by the H1N1 virus. Symptoms may be slightly more severe than symptoms that are caused by other types of seasonal flu.  H1N1 flu usually goes away in 4-8 days without treatment. Taking care of yourself at home may be all that you need to do during this time.  Stay home from work or school until your fever has been gone for 24 hours without the use of medicine.  Getting a yearly (annual) flu shot and practicing good hand hygiene may help to prevent the flu. This information is not intended to replace advice given to you by your health care  provider. Make sure you discuss any questions you have with your health care provider. Document Released: 07/27/2016 Document Revised: 07/27/2016 Document Reviewed: 07/27/2016 Elsevier Interactive Patient Education  2019 [employer].

## 2018-03-24 NOTE — Progress Notes (Signed)
Established Patient Office Visit  Subjective:  Patient ID: Carolyn Pena, female    DOB: December 22, 1975  Age: 43 y.o. MRN: [id number]  CC:  Chief Complaint  Patient presents with  . Fever    x 6 days  . Cough    productive with yellow mucus    HPI Carolyn Pena presents for   Fevers intermittently Starting 4 days ago She started taking otc nyquil She had fever Tuesday 101.4 She went to the [place] and was diagnosed with bronchitis and started on zpak and promethazine She is on day 4 of the zpak Her mucus is still thick and yellow She is having coughing spells which make her short of breath She states that she has fatigue  She reports that she gets some dizziness from the promethazine She is able to get some sleep and relief at night with the promethazine She states that she received a flu shot at   Past Medical History:  Diagnosis Date  . Allergy   . Anemia   . Anxiety   . Arthritis   . Asthma   . Chronic migraine without aura without status migrainosus, not intractable 03/10/2015  . Chronic paraplegia ([place])   . Complication of anesthesia   . CP (cerebral palsy) ([place])   . CRPS (complex regional pain syndrome), upper limb 02/28/2015  . Depression   . Family history of adverse reaction to anesthesia    sister- severe n/v  . GERD (gastroesophageal reflux disease)   . H/O multiple concussions   . Headache   . Hearing loss 10/11/2016  . Neuromuscular disorder ([place])    CP  . PONV (postoperative nausea and vomiting)    Severe  . PTSD (post-traumatic stress disorder)   . UTI (urinary tract infection)    hospitalized    Past Surgical History:  Procedure Laterality Date  . achilles release Bilateral   . EYE SURGERY    . HAMSTRING RELEASE Bilateral   . HIP SURGERY Bilateral   . JOINT REPLACEMENT    . PHOTOCOAGULATION WITH LASER Bilateral 07/03/2017   Procedure: LASER DEMARCATION BILATERAL EYES;  Surgeon: Jalene Mullet, MD;  Location: [place];  Service:  Ophthalmology;  Laterality: Bilateral;  . STRABISMUS SURGERY     2008, 1999    Family History  Problem Relation Age of Onset  . Cancer Mother        CLL, decsd in 2012  . Cerebral palsy Sister   . Breast cancer Neg Hx     Social History   Socioeconomic History  . Marital status: Single    Spouse name: Not on file  . Number of children: Not on file  . Years of education: Not on file  . Highest education level: Not on file  Occupational History  . Not on file  Social Needs  . Financial resource strain: Not on file  . Food insecurity:    Worry: Not on file    Inability: Not on file  . Transportation needs:    Medical: Not on file    Non-medical: Not on file  Tobacco Use  . Smoking status: Never Smoker  . Smokeless tobacco: Never Used  Substance and Sexual Activity  . Alcohol use: Yes    Alcohol/week: 0.0 standard drinks    Comment: occ  . Drug use: No  . Sexual activity: Not Currently  Lifestyle  . Physical activity:    Days per week: Not on file    Minutes per session: Not on  file  . Stress: Not on file  Relationships  . Social connections:    Talks on phone: Not on file    Gets together: Not on file    Attends religious service: Not on file    Active member of club or organization: Not on file    Attends meetings of clubs or organizations: Not on file    Relationship status: Not on file  . Intimate partner violence:    Fear of current or ex partner: Not on file    Emotionally abused: Not on file    Physically abused: Not on file    Forced sexual activity: Not on file  Other Topics Concern  . Not on file  Social History Narrative   Pt has doctorate degree    Outpatient Medications Prior to Visit  Medication Sig Dispense Refill  . albuterol (PROVENTIL) (2.5 MG/3ML) 0.083% nebulizer solution Take 3 mLs (2.5 mg total) by nebulization every 2 (two) hours as needed for wheezing or shortness of breath. 150 mL 3  . Albuterol Sulfate 108 (90 Base) MCG/ACT AEPB  Inhale 2 puffs into the lungs every 4 (four) hours as needed. 1 each 11  . benzonatate (TESSALON) 200 MG capsule Take 1 capsule (200 mg total) by mouth 3 (three) times daily as needed for cough. 60 capsule 1  . busPIRone (BUSPAR) 5 MG tablet TAKE 1 TABLET BY MOUTH TWICE DAILY    . butalbital-acetaminophen-caffeine (FIORICET, ESGIC) 50-325-40 MG tablet TAKE 1 TABLET BY MOUTH EVERY 4 HOURS AS NEEDED FOR HEADACHE 28 tablet 1  . clonazePAM (KLONOPIN) 1 MG tablet Take 1 tablet (1 mg total) by mouth 2 (two) times daily as needed for anxiety. 60 tablet 1  . cyclobenzaprine (FLEXERIL) 10 MG tablet Take 1 tablet (10 mg total) by mouth every 8 (eight) hours as needed for muscle spasms. 30 tablet 1  . diclofenac (VOLTAREN) 50 MG EC tablet Take 50 mg by mouth 2 (two) times daily.    . Eluxadoline (VIBERZI) 100 MG TABS     . EPINEPHrine (EPIPEN JR) 0.15 MG/0.3ML injection Inject as directed as needed.    Marland Kitchen esomeprazole (NEXIUM) 40 MG capsule     . Fluticasone-Salmeterol (ADVAIR DISKUS) 250-50 MCG/DOSE AEPB Inhale 1 puff into the lungs 2 (two) times daily. 1 each 4  . gabapentin (NEURONTIN) 300 MG capsule Take 2 capsules (600 mg total) by mouth 3 (three) times daily. 540 capsule 1  . Hydroactive Dressings (DUODERM HYDROACTIVE) GEL Use as directed multiple times daily for gluteal pressure ulcer wound care 30 g 11  . ibuprofen (ADVIL,MOTRIN) 800 MG tablet Take 800 mg by mouth every 8 (eight) hours as needed. for pain  3  . ketorolac (TORADOL) 30 MG/ML injection Inject 1 mL (30 mg total) into the muscle daily as needed (severe pain). No nsaid or otc pain med other than acetaminophen within 24 hrs 1 mL 3  . levocetirizine (XYZAL) 5 MG tablet Take 1 tablet (5 mg total) by mouth every evening. 30 tablet 5  . Multiple Vitamin (THERA) TABS Take by mouth.    . mupirocin ointment (BACTROBAN) 2 % Apply 1 application topically 3 (three) times daily as needed.  11  . nystatin cream (MYCOSTATIN) Apply 1 application topically  2 (two) times daily. 30 g 0  . ondansetron (ZOFRAN) 4 MG tablet Take 1 tablet (4 mg total) by mouth every 8 (eight) hours as needed for nausea or vomiting. 20 tablet 2  . ondansetron (ZOFRAN-ODT) 8 MG disintegrating tablet  Take 0.5 tablets (4 mg total) by mouth every 8 (eight) hours as needed for nausea. 30 tablet 0  . potassium chloride SA (K-DUR,KLOR-CON) 20 MEQ tablet Take by mouth.    . predniSONE (DELTASONE) 5 MG tablet Take 1 tablet (5 mg total) by mouth daily with breakfast. As directed by physician for asthma or urticaria flair 30 tablet 0  . promethazine (PHENERGAN) 25 MG tablet Take 1 tablet (25 mg total) by mouth every 8 (eight) hours as needed for nausea or vomiting. 20 tablet 3  . rizatriptan (MAXALT) 5 MG tablet 1 at migraine onset and repeat after 2 hours if needed. 10 tablet 11  . sertraline (ZOLOFT) 100 MG tablet TAKE 2 TABLETS BY MOUTH ONCE DAILY 180 tablet 1  . sodium fluoride (FLUORISHIELD) 1.1 % GEL dental gel APP A PEA-SIZED AMOUNT TO TOOTHBRUSH AND BRUTH TEETH THROUGHLY FOR 2 MINUTES DAILY    . traZODone (DESYREL) 50 MG tablet Take three at bedtime for sleep.    Marland Kitchen triamcinolone cream (KENALOG) 0.1 % Apply 1 application topically 2 (two) times daily. 30 g 0  . white petrolatum (VASELINE) OINT Apply 1 application topically as needed (barrier for preventive care). 500 g 3  . Zinc Oxide (SECURA EX) Apply 1 application topically daily as needed (rash).    Marland Kitchen azithromycin (ZITHROMAX) 250 MG tablet TAKE 2 TABLETS BY MOUTH ON DAY 1 AND THEN TAKE 1 TABLET BY MOUTH ONCE A DAY ON DAY 2 THROUGH DAY 5    . Control Gel Formula Dressing (DUODERM CGF DRESSING) MISC Use as directed multiple times daily for gluteal pressure ulcer wound care 60 each 11  . etonogestrel (NEXPLANON) 68 MG IMPL implant Inject into the skin.    Marland Kitchen nystatin (MYCOSTATIN) 100000 UNIT/ML suspension Take 5 mLs (500,000 Units total) by mouth 4 (four) times daily. (Patient not taking: Reported on 03/24/2018) 60 mL 0  .  promethazine-dextromethorphan (PROMETHAZINE-DM) 6.25-15 MG/5ML syrup TAKE 10 MLS BY MOUTH AT BEDTIME AS NEEDED FOR NIGHTTIME COUGH. CAN REPEAT IN 4 HOURS CAUSES DROWSINESS     No facility-administered medications prior to visit.     Allergies  Allergen Reactions  . Lidocaine Swelling    Had steroid injection doesn't know if it was the steroid or lidocaine. "Whole arm swelled & warm to touch. Severe inflamation."   . Augmentin [Amoxicillin-Pot Clavulanate] Diarrhea and Nausea And Vomiting    Has patient had a PCN reaction causing immediate rash, facial/tongue/throat swelling, SOB or lightheadedness with hypotension: No Has patient had a PCN reaction causing severe rash involving mucus membranes or skin necrosis: No Has patient had a PCN reaction that required hospitalization: No Has patient had a PCN reaction occurring within the last 10 years: #  #  #  YES  #  #  #  If all of the above answers are "NO", then may proceed with Cephalosporin use.   . Cefdinir Diarrhea  . Codeine Nausea And Vomiting  . Lortab [Hydrocodone-Acetaminophen] Nausea And Vomiting  . Methylprednisolone Rash    Had lidocaine as well  . Other Nausea And Vomiting    general anesthesia causes severe nausea and vomiting     ROS Review of Systems    Objective:    Physical Exam  BP 116/72   Pulse 95   Temp 99.2 F (37.3 C) (Oral)   Resp 16   LMP 03/17/2018   SpO2 98%  Wt Readings from Last 3 Encounters:  01/23/18 140 lb (63.5 kg)  12/26/17 135 lb (61.2  kg)  07/03/17 140 lb 3.4 oz (63.6 kg)   General: alert, oriented, in NAD Head: normocephalic, atraumatic, no sinus tenderness Eyes: EOM intact, no scleral icterus or conjunctival injection Ears: TM clear bilaterally Nose: mucosa nonerythematous, nonedematous Throat: no pharyngeal exudate or erythema Lymph: no posterior auricular, submental or cervical lymph adenopathy Heart: normal rate, normal sinus rhythm, no murmurs Lungs: wheezing  diffusely   There are no preventive care reminders to display for this patient.  There are no preventive care reminders to display for this patient.     Assessment & Plan:   Problem List Items Addressed This Visit    None    Visit Diagnoses    Type A influenza    -  Primary  Advised supportive care Complete zpak   Relevant Medications   azithromycin (ZITHROMAX) 250 MG tablet   Fever, unspecified       Relevant Orders   POCT Influenza A/B (Completed)   Wheezing       Relevant Medications   albuterol (PROVENTIL) (2.5 MG/3ML) 0.083% nebulizer solution 2.5 mg (Completed)   ipratropium (ATROVENT) nebulizer solution 0.5 mg (Completed)  Advised continue albuterol nebs at home Discussed steroid burst to help wheezing    Acute bronchitis due to Haemophilus influenzae       Relevant Medications   azithromycin (ZITHROMAX) 250 MG tablet      Meds ordered this encounter  Medications  . DISCONTD: ipratropium-albuterol (DUONEB) 0.5-2.5 (3) MG/3ML nebulizer solution 3 mL  . predniSONE (DELTASONE) 20 MG tablet    Sig: Take 2 tablets (40 mg total) by mouth daily with breakfast for 5 days.    Dispense:  10 tablet    Refill:  0  . albuterol (PROVENTIL) (2.5 MG/3ML) 0.083% nebulizer solution 2.5 mg  . ipratropium (ATROVENT) nebulizer solution 0.5 mg    Follow-up: No follow-ups on file.    Forrest Moron, MD

## 2018-03-26 ENCOUNTER — Encounter: Payer: Self-pay | Admitting: Family Medicine

## 2018-04-09 ENCOUNTER — Encounter: Payer: Self-pay | Admitting: Family Medicine

## 2018-04-10 ENCOUNTER — Ambulatory Visit: Payer: Self-pay | Admitting: Family Medicine

## 2018-04-12 ENCOUNTER — Other Ambulatory Visit: Payer: Self-pay | Admitting: Family Medicine

## 2018-04-12 NOTE — Telephone Encounter (Signed)
Rerouted to PCP

## 2018-04-12 NOTE — Telephone Encounter (Signed)
Requested medication (s) are due for refill today -yes  Requested medication (s) are on the active medication list -yes  Future visit scheduled -yes  Last refill: 02/10/18  Notes to clinic: Patient is requesting refill of non delegated Rx. Sent for PCP review   Requested Prescriptions  Pending Prescriptions Disp Refills   ondansetron (ZOFRAN-ODT) 8 MG disintegrating tablet [Pharmacy Med Name: Ondansetron 8 MG Oral Tablet Disintegrating] 30 tablet 0    Sig: DISSOLVE 1/2 (ONE-HALF) TABLET IN MOUTH EVERY 8 HOURS AS NEEDED FOR NAUSEA     Not Delegated - Gastroenterology: Antiemetics Failed - 04/12/2018  1:17 PM      Failed - This refill cannot be delegated      Passed - Valid encounter within last 6 months    Recent Outpatient Visits          2 weeks ago Type A influenza   Primary Care at [facility], Arlie Solomons, MD   3 weeks ago Diaper rash   Primary Care at [facility], Arlie Solomons, MD   2 months ago Myalgia   [facility] at [facility], Laurey Arrow, MD   3 months ago Myalgia   Primary Care at [facility], Arlie Solomons, MD   4 months ago Dysuria   [facility] at [facility], Lilia Argue, MD      Future Appointments            In 1 month Brigitte Pulse Laurey Arrow, MD Primary Care at [place], [facility]            Requested Prescriptions  Pending Prescriptions Disp Refills   ondansetron (ZOFRAN-ODT) 8 MG disintegrating tablet [Pharmacy Med Name: Ondansetron 8 MG Oral Tablet Disintegrating] 30 tablet 0    Sig: DISSOLVE 1/2 (ONE-HALF) TABLET IN MOUTH EVERY 8 HOURS AS NEEDED FOR NAUSEA     Not Delegated - Gastroenterology: Antiemetics Failed - 04/12/2018  1:17 PM      Failed - This refill cannot be delegated      Passed - Valid encounter within last 6 months    Recent Outpatient Visits          2 weeks ago Type A influenza   Primary Care at [facility], Arlie Solomons, MD   3 weeks ago Diaper rash   Primary Care at [facility], Arlie Solomons, MD   2 months ago Myalgia   [facility] at [facility],  Laurey Arrow, MD   3 months ago Myalgia   Primary Care at [facility], Arlie Solomons, MD   4 months ago Dysuria   [facility] at [facility], Lilia Argue, MD      Future Appointments            In 1 month Brigitte Pulse Laurey Arrow, MD Primary Care at [place], [facility]

## 2018-04-26 ENCOUNTER — Other Ambulatory Visit: Payer: Self-pay | Admitting: Family Medicine

## 2018-04-26 DIAGNOSIS — R102 Pelvic and perineal pain: Secondary | ICD-10-CM

## 2018-04-26 NOTE — Telephone Encounter (Signed)
Requested medication (s) are due for refill today: yes  Requested medication (s) are on the active medication list: yes    Last refill: 04/12/2018 #30 0 refills  Future visit scheduled yes 05/30/2018 Dr. Brigitte Pulse  Notes to clinic:not delegated  Requested Prescriptions  Pending Prescriptions Disp Refills   ondansetron (ZOFRAN-ODT) 8 MG disintegrating tablet [Pharmacy Med Name: Ondansetron 8 MG Oral Tablet Disintegrating] 30 tablet 0    Sig: DISSOLVE 1/2 (ONE-HALF) TABLET IN MOUTH EVERY 8 HOURS AS NEEDED FOR NAUSEA     Not Delegated - Gastroenterology: Antiemetics Failed - 04/26/2018  1:40 PM      Failed - This refill cannot be delegated      Passed - Valid encounter within last 6 months    Recent Outpatient Visits          1 month ago Type A influenza   Primary Care at [facility], Arlie Solomons, MD   1 month ago Diaper rash   Primary Care at [facility], Arlie Solomons, MD   3 months ago Myalgia   [facility] at [facility], Laurey Arrow, MD   3 months ago Myalgia   Primary Care at [place], MD   5 months ago Dysuria   [facility] at [facility], Lilia Argue, MD      Future Appointments            In 1 month Brigitte Pulse Laurey Arrow, MD Primary Care at [place], [facility]

## 2018-04-27 NOTE — Telephone Encounter (Signed)
Requested medication (s) are due for refill today: Yes  Requested medication (s) are on the active medication list: Yes  Last refill:  11/24/17  Future visit scheduled: Yes  Notes to clinic:  See request    Requested Prescriptions  Pending Prescriptions Disp Refills   clonazePAM (KLONOPIN) 1 MG tablet [Pharmacy Med Name: clonazePAM 1 MG Oral Tablet] 60 tablet 0    Sig: Take 1 tablet by mouth twice daily as needed for anxiety     Not Delegated - Psychiatry:  Anxiolytics/Hypnotics Failed - 04/26/2018 10:03 PM      Failed - This refill cannot be delegated      Failed - Urine Drug Screen completed in last 360 days.      Passed - Valid encounter within last 6 months    Recent Outpatient Visits          1 month ago Type A influenza   Primary Care at [facility], Arlie Solomons, MD   1 month ago Diaper rash   Primary Care at [facility], Arlie Solomons, MD   3 months ago Myalgia   [facility] at [facility], Laurey Arrow, MD   3 months ago Myalgia   Primary Care at [place], MD   5 months ago Dysuria   [facility] at [facility], Lilia Argue, MD      Future Appointments            In 1 month Brigitte Pulse Laurey Arrow, MD Primary Care at [place], [facility]

## 2018-04-27 NOTE — Telephone Encounter (Signed)
Pt is requesting medication

## 2018-04-30 DIAGNOSIS — Z9889 Other specified postprocedural states: Secondary | ICD-10-CM | POA: Diagnosis not present

## 2018-04-30 DIAGNOSIS — G809 Cerebral palsy, unspecified: Secondary | ICD-10-CM | POA: Diagnosis not present

## 2018-04-30 DIAGNOSIS — H4423 Degenerative myopia, bilateral: Secondary | ICD-10-CM | POA: Diagnosis not present

## 2018-04-30 DIAGNOSIS — H5032 Intermittent alternating esotropia: Secondary | ICD-10-CM | POA: Diagnosis not present

## 2018-05-01 ENCOUNTER — Telehealth: Payer: Self-pay | Admitting: Family Medicine

## 2018-05-01 NOTE — Telephone Encounter (Signed)
mychart message sent to pt about their appointment with Dr Brigitte Pulse

## 2018-05-02 ENCOUNTER — Encounter: Payer: Self-pay | Admitting: Family Medicine

## 2018-05-03 ENCOUNTER — Ambulatory Visit (INDEPENDENT_AMBULATORY_CARE_PROVIDER_SITE_OTHER): Payer: BLUE CROSS/BLUE SHIELD | Admitting: Family Medicine

## 2018-05-03 ENCOUNTER — Other Ambulatory Visit: Payer: Self-pay

## 2018-05-03 VITALS — BP 131/82 | HR 98 | Temp 98.4°F | Resp 14

## 2018-05-03 DIAGNOSIS — J329 Chronic sinusitis, unspecified: Secondary | ICD-10-CM | POA: Diagnosis not present

## 2018-05-03 DIAGNOSIS — R5383 Other fatigue: Secondary | ICD-10-CM | POA: Diagnosis not present

## 2018-05-03 DIAGNOSIS — R6889 Other general symptoms and signs: Secondary | ICD-10-CM | POA: Diagnosis not present

## 2018-05-03 DIAGNOSIS — J45909 Unspecified asthma, uncomplicated: Secondary | ICD-10-CM | POA: Diagnosis not present

## 2018-05-03 DIAGNOSIS — J40 Bronchitis, not specified as acute or chronic: Secondary | ICD-10-CM

## 2018-05-03 LAB — POCT INFLUENZA A/B
INFLUENZA B, POC: NEGATIVE
Influenza A, POC: NEGATIVE

## 2018-05-03 MED ORDER — PREDNISONE 20 MG PO TABS: 40.0000 mg | ORAL_TABLET | Freq: Every day | ORAL | 0 refills | Status: DC

## 2018-05-03 MED ORDER — AZITHROMYCIN 250 MG PO TABS: ORAL_TABLET | ORAL | 0 refills | Status: DC

## 2018-05-03 NOTE — Patient Instructions (Addendum)
  Flu test was negative.  Symptoms likely due to a virus, but with worsening symptoms of sinus and bronchitis symptoms, I did write for azithromycin given.  Prednisone 2 pills/day for 3 days for asthma symptoms although lungs are currently clear.  Continue albuterol as needed for wheezing.  I will check a blood count, but increase fluid intake as that may be related to fatigue.  If not improving the next 4 to 5 days or worsening sooner return for recheck.     If you have lab work done today you will be contacted with your lab results within the next 2 weeks.  If you have not heard from [place] then please contact us. The fastest way to get your results is to register for My Chart.   IF you received an x-ray today, you will receive an invoice from [facility]. Please contact [facility] at [phone] with questions or concerns regarding your invoice.   IF you received labwork today, you will receive an invoice from [place]. Please contact [facility] at [phone] with questions or concerns regarding your invoice.   Our billing staff will not be able to assist you with questions regarding bills from these companies.  You will be contacted with the lab results as soon as they are available. The fastest way to get your results is to activate your My Chart account. Instructions are located on the last page of this paperwork. If you have not heard from [place] regarding the results in 2 weeks, please contact this office.

## 2018-05-03 NOTE — Progress Notes (Signed)
Subjective:    Patient ID: Carolyn Pena, female    DOB: 10-Nov-1975, 43 y.o.   MRN: [id number]  HPI Carolyn Pena is a 43 y.o. female Presents today for: Chief Complaint  Patient presents with  . URI    was seen here 03/24/18 dx with flu. got better than 1 wk ago started back getting sick with sob, wheezing, cough and chest songestion, fever chills and lethargic. Patient request flu test for school purpose  . Fatigue    thinking my iron is low. Would like to have this checked    Diagnosed with influenza A on January 25. Had felt better after treatment with Zpak, prednisone 66m mg for 5 days.  Started feeling symptoms again 1 week ago. Fever, chills, body aches, chest and nasal congestion, wheezing and short of breath, sore throat. More fatigue (concerned about anemia as well as hx of anemia in past). Up to 101 fever.  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  Multiple sick contacts  With influenza.  No foreign travel. Has international students, but no known contact with individuals that have been in region of coronavirus.  Yellow phlegm. Green mucus past 4 days with worse sinus pain. No tooth pain. Some ear pressure.   Tx: albuterol inhaler - 2-3 times per day recently (prior 12 per month). Has remained on Advair BID.  vicks vapocool, tessalon perles - 2 times per day.  Some fluids, but pedialyte pops.   No side effects with prior prednisone.   Review of Systems Per HPI.     Objective:   Physical Exam Vitals signs reviewed.  Constitutional:      General: She is not in acute distress.    Appearance: She is well-developed.  HENT:     Head: Normocephalic and atraumatic.     Right Ear: Hearing, tympanic membrane, ear canal and external ear normal.     Left Ear: Hearing, tympanic membrane, ear canal  and external ear normal.     Nose: Nose normal.     Mouth/Throat:     Pharynx: No oropharyngeal exudate.  Eyes:     Conjunctiva/sclera: Conjunctivae normal.     Pupils: Pupils are equal, round, and reactive to light.  Cardiovascular:     Rate and Rhythm: Normal rate and regular rhythm.     Heart sounds: Normal heart sounds. No murmur.  Pulmonary:     Effort: Pulmonary effort is normal. No respiratory distress.     Breath sounds: Normal breath sounds. No wheezing or rhonchi.  Skin:    General: Skin is warm and dry.     Findings: No rash.  Neurological:     Mental Status: She is alert and oriented to person, place, and time.  Psychiatric:        Behavior: Behavior normal.    Vitals:   05/03/18 1649  BP: 131/82  Pulse: 98  Resp: 14  Temp: 98.4 F (36.9 C)  TempSrc: Oral  SpO2: 96%    Results for orders placed or performed in visit on 05/03/18  CBC  Result Value Ref Range   WBC 10.8 3.4 - 10.8 x10E3/uL   RBC 4.53 3.77 - 5.28 x10E6/uL   Hemoglobin 12.4 11.1 - 15.9 g/dL   Hematocrit 37.0 34.0 - 46.6 %   MCV  82 79 - 97 fL   MCH 27.4 26.6 - 33.0 pg   MCHC 33.5 31.5 - 35.7 g/dL   RDW 14.3 11.7 - 15.4 %   Platelets 236 150 - 450 x10E3/uL  POCT Influenza A/B  Result Value Ref Range   Influenza A, POC Negative Negative   Influenza B, POC Negative Negative       Assessment & Plan:   Carolyn Pena is a 43 y.o. female Flu-like symptoms - Plan: POCT Influenza A/B  Fatigue, unspecified type - Plan: CBC, CANCELED: POCT CBC  Asthma, unspecified asthma severity, unspecified whether complicated, unspecified whether persistent - Plan: predniSONE (DELTASONE) 20 MG tablet  Sinobronchitis - Plan: azithromycin (ZITHROMAX) 250 MG tablet, predniSONE (DELTASONE) 20 MG tablet  Likely initial viral syndrome, worsening sinus bronchitis symptoms with borderline leukocytosis.  Start azithromycin, potential side effects and risk discussed.  Prednisone burst x3 days for asthma.   Continue albuterol as needed.  Reassuring hemoglobin.  Other symptomatic care discussed, RTC precautions.   Meds ordered this encounter  Medications  . azithromycin (ZITHROMAX) 250 MG tablet    Sig: Take 2 pills by mouth on day 1, then 1 pill by mouth per day on days 2 through 5.    Dispense:  6 tablet    Refill:  0  . predniSONE (DELTASONE) 20 MG tablet    Sig: Take 2 tablets (40 mg total) by mouth daily with breakfast.    Dispense:  6 tablet    Refill:  0   Patient Instructions    Flu test was negative.  Symptoms likely due to a virus, but with worsening symptoms of sinus and bronchitis symptoms, I did write for azithromycin given.  Prednisone 2 pills/day for 3 days for asthma symptoms although lungs are currently clear.  Continue albuterol as needed for wheezing.  I will check a blood count, but increase fluid intake as that may be related to fatigue.  If not improving the next 4 to 5 days or worsening sooner return for recheck.     If you have lab work done today you will be contacted with your lab results within the next 2 weeks.  If you have not heard from [place] then please contact us. The fastest way to get your results is to register for My Chart.   IF you received an x-ray today, you will receive an invoice from [facility]. Please contact [facility] at [phone] with questions or concerns regarding your invoice.   IF you received labwork today, you will receive an invoice from [place]. Please contact [facility] at [phone] with questions or concerns regarding your invoice.   Our billing staff will not be able to assist you with questions regarding bills from these companies.  You will be contacted with the lab results as soon as they are available. The fastest way to get your results is to activate your My Chart account. Instructions are located on the last page of this paperwork. If you have not heard from [place] regarding the results in 2 weeks, please  contact this office.       Signed,   Merri Ray, MD Primary Care at [place].  05/06/18 12:35 PM

## 2018-05-04 LAB — CBC
Hematocrit: 37 % (ref 34.0–46.6)
Hemoglobin: 12.4 g/dL (ref 11.1–15.9)
MCH: 27.4 pg (ref 26.6–33.0)
MCHC: 33.5 g/dL (ref 31.5–35.7)
MCV: 82 fL (ref 79–97)
Platelets: 236 10*3/uL (ref 150–450)
RBC: 4.53 x10E6/uL (ref 3.77–5.28)
RDW: 14.3 % (ref 11.7–15.4)
WBC: 10.8 10*3/uL (ref 3.4–10.8)

## 2018-05-06 ENCOUNTER — Encounter: Payer: Self-pay | Admitting: Family Medicine

## 2018-05-07 DIAGNOSIS — H5319 Other subjective visual disturbances: Secondary | ICD-10-CM | POA: Diagnosis not present

## 2018-05-07 DIAGNOSIS — H43811 Vitreous degeneration, right eye: Secondary | ICD-10-CM | POA: Diagnosis not present

## 2018-05-07 DIAGNOSIS — H25013 Cortical age-related cataract, bilateral: Secondary | ICD-10-CM | POA: Diagnosis not present

## 2018-05-07 DIAGNOSIS — H5711 Ocular pain, right eye: Secondary | ICD-10-CM | POA: Diagnosis not present

## 2018-05-07 DIAGNOSIS — H47393 Other disorders of optic disc, bilateral: Secondary | ICD-10-CM | POA: Diagnosis not present

## 2018-05-08 MED ORDER — ESOMEPRAZOLE MAGNESIUM 40 MG PO CPDR: 40.0000 mg | DELAYED_RELEASE_CAPSULE | Freq: Every day | ORAL | 3 refills | Status: AC

## 2018-05-08 MED ORDER — DICLOFENAC SODIUM 50 MG PO TBEC: 50.0000 mg | DELAYED_RELEASE_TABLET | Freq: Two times a day (BID) | ORAL | 1 refills | Status: AC | PRN

## 2018-05-08 MED ORDER — LEVOCETIRIZINE DIHYDROCHLORIDE 5 MG PO TABS: 5.0000 mg | ORAL_TABLET | Freq: Every evening | ORAL | 5 refills | Status: AC

## 2018-05-08 MED ORDER — RIZATRIPTAN BENZOATE 5 MG PO TABS: ORAL_TABLET | ORAL | 11 refills | Status: AC

## 2018-05-18 ENCOUNTER — Other Ambulatory Visit: Payer: Self-pay | Admitting: Family Medicine

## 2018-05-18 DIAGNOSIS — R102 Pelvic and perineal pain: Secondary | ICD-10-CM

## 2018-05-20 ENCOUNTER — Other Ambulatory Visit: Payer: Self-pay | Admitting: Family Medicine

## 2018-05-20 DIAGNOSIS — J45909 Unspecified asthma, uncomplicated: Secondary | ICD-10-CM

## 2018-05-20 DIAGNOSIS — J329 Chronic sinusitis, unspecified: Secondary | ICD-10-CM

## 2018-05-20 DIAGNOSIS — J40 Bronchitis, not specified as acute or chronic: Secondary | ICD-10-CM

## 2018-05-30 ENCOUNTER — Ambulatory Visit: Payer: Self-pay | Admitting: Family Medicine

## 2018-06-16 ENCOUNTER — Other Ambulatory Visit: Payer: Self-pay | Admitting: Family Medicine

## 2018-06-16 DIAGNOSIS — R102 Pelvic and perineal pain: Secondary | ICD-10-CM

## 2018-06-18 ENCOUNTER — Telehealth: Payer: Self-pay

## 2018-06-18 NOTE — Telephone Encounter (Signed)
Forms faxed to [facility] for pt dme

## 2018-06-28 ENCOUNTER — Telehealth: Payer: BLUE CROSS/BLUE SHIELD | Admitting: Family Medicine

## 2018-06-28 ENCOUNTER — Other Ambulatory Visit: Payer: Self-pay

## 2018-06-28 ENCOUNTER — Encounter: Payer: Self-pay | Admitting: Family Medicine

## 2018-07-18 ENCOUNTER — Other Ambulatory Visit: Payer: Self-pay | Admitting: Family Medicine

## 2018-07-18 DIAGNOSIS — R102 Pelvic and perineal pain: Secondary | ICD-10-CM

## 2018-08-05 ENCOUNTER — Other Ambulatory Visit: Payer: Self-pay | Admitting: Family Medicine

## 2018-08-05 DIAGNOSIS — R102 Pelvic and perineal pain: Secondary | ICD-10-CM

## 2018-08-14 ENCOUNTER — Ambulatory Visit: Payer: BLUE CROSS/BLUE SHIELD | Admitting: Family Medicine

## 2018-08-16 ENCOUNTER — Encounter: Payer: Self-pay | Admitting: Family Medicine

## 2018-08-17 DIAGNOSIS — F411 Generalized anxiety disorder: Secondary | ICD-10-CM | POA: Diagnosis not present

## 2018-08-17 DIAGNOSIS — B372 Candidiasis of skin and nail: Secondary | ICD-10-CM | POA: Diagnosis not present

## 2018-08-17 DIAGNOSIS — F331 Major depressive disorder, recurrent, moderate: Secondary | ICD-10-CM | POA: Diagnosis not present

## 2018-08-31 ENCOUNTER — Other Ambulatory Visit: Payer: Self-pay | Admitting: Family Medicine

## 2018-08-31 DIAGNOSIS — R102 Pelvic and perineal pain: Secondary | ICD-10-CM

## 2018-09-14 DIAGNOSIS — F411 Generalized anxiety disorder: Secondary | ICD-10-CM | POA: Diagnosis not present

## 2018-09-14 DIAGNOSIS — F331 Major depressive disorder, recurrent, moderate: Secondary | ICD-10-CM | POA: Diagnosis not present

## 2018-09-14 DIAGNOSIS — K219 Gastro-esophageal reflux disease without esophagitis: Secondary | ICD-10-CM | POA: Diagnosis not present

## 2018-09-17 DIAGNOSIS — S46211D Strain of muscle, fascia and tendon of other parts of biceps, right arm, subsequent encounter: Secondary | ICD-10-CM | POA: Diagnosis not present

## 2018-09-17 DIAGNOSIS — Z13220 Encounter for screening for lipoid disorders: Secondary | ICD-10-CM | POA: Diagnosis not present

## 2018-09-17 DIAGNOSIS — F331 Major depressive disorder, recurrent, moderate: Secondary | ICD-10-CM | POA: Diagnosis not present

## 2018-09-17 DIAGNOSIS — R05 Cough: Secondary | ICD-10-CM | POA: Diagnosis not present

## 2018-09-17 DIAGNOSIS — E559 Vitamin D deficiency, unspecified: Secondary | ICD-10-CM | POA: Diagnosis not present

## 2018-09-17 DIAGNOSIS — Z131 Encounter for screening for diabetes mellitus: Secondary | ICD-10-CM | POA: Diagnosis not present

## 2018-09-17 DIAGNOSIS — R7982 Elevated C-reactive protein (CRP): Secondary | ICD-10-CM | POA: Diagnosis not present

## 2018-09-17 DIAGNOSIS — Z0000 Encounter for general adult medical examination without abnormal findings: Secondary | ICD-10-CM | POA: Diagnosis not present

## 2018-09-17 DIAGNOSIS — M7501 Adhesive capsulitis of right shoulder: Secondary | ICD-10-CM | POA: Diagnosis not present

## 2018-09-18 ENCOUNTER — Other Ambulatory Visit: Payer: Self-pay | Admitting: Family Medicine

## 2018-09-18 DIAGNOSIS — Z1231 Encounter for screening mammogram for malignant neoplasm of breast: Secondary | ICD-10-CM

## 2018-09-19 ENCOUNTER — Other Ambulatory Visit: Payer: Self-pay | Admitting: Physician Assistant

## 2018-09-19 DIAGNOSIS — R5381 Other malaise: Secondary | ICD-10-CM

## 2018-09-19 DIAGNOSIS — Z1231 Encounter for screening mammogram for malignant neoplasm of breast: Secondary | ICD-10-CM

## 2018-09-20 DIAGNOSIS — Z0000 Encounter for general adult medical examination without abnormal findings: Secondary | ICD-10-CM | POA: Diagnosis not present

## 2018-09-25 ENCOUNTER — Ambulatory Visit: Payer: BLUE CROSS/BLUE SHIELD

## 2018-10-05 DIAGNOSIS — L989 Disorder of the skin and subcutaneous tissue, unspecified: Secondary | ICD-10-CM | POA: Diagnosis not present

## 2018-10-05 DIAGNOSIS — D485 Neoplasm of uncertain behavior of skin: Secondary | ICD-10-CM | POA: Diagnosis not present

## 2018-10-05 DIAGNOSIS — D225 Melanocytic nevi of trunk: Secondary | ICD-10-CM | POA: Diagnosis not present

## 2018-10-05 DIAGNOSIS — L814 Other melanin hyperpigmentation: Secondary | ICD-10-CM | POA: Diagnosis not present

## 2018-10-05 DIAGNOSIS — D1801 Hemangioma of skin and subcutaneous tissue: Secondary | ICD-10-CM | POA: Diagnosis not present

## 2018-10-09 ENCOUNTER — Encounter: Payer: Self-pay | Admitting: Radiology

## 2018-10-22 DIAGNOSIS — F331 Major depressive disorder, recurrent, moderate: Secondary | ICD-10-CM | POA: Diagnosis not present

## 2018-10-22 DIAGNOSIS — H02849 Edema of unspecified eye, unspecified eyelid: Secondary | ICD-10-CM | POA: Diagnosis not present

## 2018-10-22 DIAGNOSIS — G43009 Migraine without aura, not intractable, without status migrainosus: Secondary | ICD-10-CM | POA: Diagnosis not present

## 2018-10-22 DIAGNOSIS — R110 Nausea: Secondary | ICD-10-CM | POA: Diagnosis not present

## 2018-10-22 DIAGNOSIS — K219 Gastro-esophageal reflux disease without esophagitis: Secondary | ICD-10-CM | POA: Diagnosis not present

## 2018-10-22 DIAGNOSIS — Z03818 Encounter for observation for suspected exposure to other biological agents ruled out: Secondary | ICD-10-CM | POA: Diagnosis not present

## 2018-10-22 DIAGNOSIS — R509 Fever, unspecified: Secondary | ICD-10-CM | POA: Diagnosis not present

## 2018-10-28 ENCOUNTER — Other Ambulatory Visit: Payer: Self-pay | Admitting: Family Medicine

## 2018-10-29 NOTE — Telephone Encounter (Signed)
Requested medication (s) are due for refill today:yes  Requested medication (s) are on the active medication list: yes  Last refill:  09/12/2018  Future visit scheduled: no  Notes to clinic:  Review for refill  Requested Prescriptions  Pending Prescriptions Disp Refills   mupirocin ointment (BACTROBAN) 2 % [Pharmacy Med Name: Mupirocin 2 % External Ointment] 22 g 0    Sig: APPLY 1 OINTMENT TOPICALLY THREE TIMES DAILY     Off-Protocol Failed - 10/28/2018  9:37 PM      Failed - Medication not assigned to a protocol, review manually.      Passed - Valid encounter within last 12 months    Recent Outpatient Visits          5 months ago Flu-like symptoms   [facility], Ranell Patrick, MD   7 months ago Type A influenza   [facility] at [facility], Arlie Solomons, MD   7 months ago Diaper rash   [facility] at [facility], Arlie Solomons, MD   9 months ago Myalgia   [facility], Laurey Arrow, MD   10 months ago Myalgia   [facility] at [facility], Arlie Solomons, MD

## 2018-11-08 ENCOUNTER — Other Ambulatory Visit: Payer: Self-pay

## 2018-11-08 ENCOUNTER — Ambulatory Visit (INDEPENDENT_AMBULATORY_CARE_PROVIDER_SITE_OTHER): Payer: Medicaid Other | Admitting: Family Medicine

## 2018-11-08 ENCOUNTER — Encounter: Payer: Self-pay | Admitting: Family Medicine

## 2018-11-08 VITALS — BP 138/78 | HR 72

## 2018-11-08 DIAGNOSIS — R102 Pelvic and perineal pain: Secondary | ICD-10-CM

## 2018-11-08 DIAGNOSIS — Z8719 Personal history of other diseases of the digestive system: Secondary | ICD-10-CM | POA: Diagnosis not present

## 2018-11-08 DIAGNOSIS — R110 Nausea: Secondary | ICD-10-CM | POA: Diagnosis not present

## 2018-11-08 DIAGNOSIS — R112 Nausea with vomiting, unspecified: Secondary | ICD-10-CM | POA: Diagnosis not present

## 2018-11-08 DIAGNOSIS — R197 Diarrhea, unspecified: Secondary | ICD-10-CM | POA: Diagnosis not present

## 2018-11-08 DIAGNOSIS — N921 Excessive and frequent menstruation with irregular cycle: Secondary | ICD-10-CM | POA: Diagnosis not present

## 2018-11-08 MED ORDER — MEDROXYPROGESTERONE ACETATE 10 MG PO TABS: 10.0000 mg | ORAL_TABLET | Freq: Every day | ORAL | 2 refills | Status: AC

## 2018-11-08 NOTE — Progress Notes (Signed)
Patient reports some cramping since having nexplanon. Pelvic pain This past week had diarrhea, vomiting and bad stomach pain.  Last pap 12/26/2017

## 2018-11-08 NOTE — Progress Notes (Signed)
   Subjective:    Patient ID: Carolyn Pena is a 43 y.o. female presenting with GYN Follow up  on 11/08/2018  HPI: Here with cramping and bleeding. Has Nexplanon in x 2 years and last bleed was 3 weeks ago and light. Then noted to begin with nausea and diarrhea and heavier bleeding x 6 days. Seen by PCP who thought she might have diverticulitis. She has been laid off and is under a lot of stress. 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 She is interviewing for jobs. She has h/o ovarian cyst and has not had f/u for this.  Review of Systems  Constitutional: Negative for chills and fever.  Respiratory: Negative for shortness of breath.   Cardiovascular: Negative for chest pain.  Gastrointestinal: Negative for abdominal pain, nausea and vomiting.  Genitourinary: Negative for dysuria.  Skin: Negative for rash.      Objective:    BP 138/78   Pulse 72  Physical Exam Constitutional:      General: She is not in acute distress.    Appearance: She is well-developed.     Comments: Wheel chair bound  HENT:     Head: Normocephalic and atraumatic.  Eyes:     General: No scleral icterus. Neck:     Musculoskeletal: Neck supple.  Cardiovascular:     Rate and Rhythm: Normal rate.  Pulmonary:     Effort: Pulmonary effort is normal.  Abdominal:     Palpations: Abdomen is soft.  Skin:    General: Skin is warm and dry.  Neurological:     Mental Status: She is alert and oriented to person, place, and time.         Assessment & Plan:   Problem List Items Addressed This Visit      Unprioritized   Pelvic pain    Trial of NSAIDS and check u/s. May be bowel related.      Menorrhagia with irregular cycle - Primary    Given that her cycles have been well controlled, suspect this is stress related. Add progesterone for 5 days, to stop bleeding. Will watch and see how future cycles go.      Relevant Medications   medroxyPROGESTERone (PROVERA)  10 MG tablet      Total face-to-face time with patient: 25 minutes. Over 50% of encounter was spent on counseling and coordination of care. Return in about 1 week (around 11/15/2018) for check in via MyChart.  Donnamae Jude 11/09/2018 8:15 AM

## 2018-11-09 ENCOUNTER — Encounter: Payer: Self-pay | Admitting: Family Medicine

## 2018-11-09 DIAGNOSIS — N921 Excessive and frequent menstruation with irregular cycle: Secondary | ICD-10-CM | POA: Insufficient documentation

## 2018-11-09 NOTE — Assessment & Plan Note (Signed)
Trial of NSAIDS and check u/s. May be bowel related.

## 2018-11-09 NOTE — Assessment & Plan Note (Addendum)
Given that her cycles have been well controlled, suspect this is stress related. Add progesterone for 5 days, to stop bleeding. Will watch and see how future cycles go.

## 2018-11-12 DIAGNOSIS — R110 Nausea: Secondary | ICD-10-CM | POA: Diagnosis not present

## 2018-11-12 DIAGNOSIS — R197 Diarrhea, unspecified: Secondary | ICD-10-CM | POA: Diagnosis not present

## 2018-11-14 DIAGNOSIS — Z8719 Personal history of other diseases of the digestive system: Secondary | ICD-10-CM | POA: Diagnosis not present

## 2018-11-14 DIAGNOSIS — R110 Nausea: Secondary | ICD-10-CM | POA: Diagnosis not present

## 2018-11-14 DIAGNOSIS — R197 Diarrhea, unspecified: Secondary | ICD-10-CM | POA: Diagnosis not present

## 2018-11-21 ENCOUNTER — Other Ambulatory Visit: Payer: Self-pay

## 2018-11-21 ENCOUNTER — Ambulatory Visit (HOSPITAL_COMMUNITY)
Admission: RE | Admit: 2018-11-21 | Discharge: 2018-11-21 | Disposition: A | Discharge status: Home / Self Care | Payer: BLUE CROSS/BLUE SHIELD | Source: Ambulatory Visit | Attending: Family Medicine | Admitting: Family Medicine

## 2018-11-21 DIAGNOSIS — R102 Pelvic and perineal pain: Secondary | ICD-10-CM | POA: Diagnosis not present

## 2018-11-29 ENCOUNTER — Other Ambulatory Visit: Payer: Self-pay | Admitting: Physician Assistant

## 2018-11-29 DIAGNOSIS — M8588 Other specified disorders of bone density and structure, other site: Secondary | ICD-10-CM

## 2018-11-30 DIAGNOSIS — Z9109 Other allergy status, other than to drugs and biological substances: Secondary | ICD-10-CM | POA: Diagnosis not present

## 2018-11-30 DIAGNOSIS — F418 Other specified anxiety disorders: Secondary | ICD-10-CM | POA: Diagnosis not present

## 2018-11-30 DIAGNOSIS — F331 Major depressive disorder, recurrent, moderate: Secondary | ICD-10-CM | POA: Diagnosis not present

## 2018-11-30 DIAGNOSIS — H9201 Otalgia, right ear: Secondary | ICD-10-CM | POA: Diagnosis not present

## 2018-12-03 ENCOUNTER — Other Ambulatory Visit: Payer: BLUE CROSS/BLUE SHIELD

## 2018-12-03 ENCOUNTER — Ambulatory Visit: Payer: BLUE CROSS/BLUE SHIELD

## 2019-01-10 ENCOUNTER — Other Ambulatory Visit: Payer: Self-pay | Admitting: Gastroenterology

## 2019-01-10 DIAGNOSIS — R1084 Generalized abdominal pain: Secondary | ICD-10-CM | POA: Diagnosis not present

## 2019-01-10 DIAGNOSIS — R197 Diarrhea, unspecified: Secondary | ICD-10-CM | POA: Diagnosis not present

## 2019-02-12 ENCOUNTER — Ambulatory Visit (HOSPITAL_COMMUNITY)
Admission: RE | Admit: 2019-02-12 | Payer: BLUE CROSS/BLUE SHIELD | Source: Home / Self Care | Admitting: Gastroenterology

## 2019-02-12 ENCOUNTER — Encounter (HOSPITAL_COMMUNITY): Admission: RE | Payer: Self-pay | Source: Home / Self Care

## 2019-02-12 SURGERY — COLONOSCOPY WITH PROPOFOL
Anesthesia: Monitor Anesthesia Care

## 2019-02-18 ENCOUNTER — Ambulatory Visit
Admission: RE | Admit: 2019-02-18 | Discharge: 2019-02-18 | Disposition: A | Discharge status: Home / Self Care | Payer: Medicaid Other | Source: Ambulatory Visit | Attending: Physician Assistant | Admitting: Physician Assistant

## 2019-02-18 ENCOUNTER — Other Ambulatory Visit: Payer: Self-pay

## 2019-02-18 DIAGNOSIS — Z1231 Encounter for screening mammogram for malignant neoplasm of breast: Secondary | ICD-10-CM

## 2019-02-18 DIAGNOSIS — M8588 Other specified disorders of bone density and structure, other site: Secondary | ICD-10-CM

## 2019-03-12 ENCOUNTER — Other Ambulatory Visit: Payer: Self-pay | Admitting: Family Medicine

## 2019-03-12 DIAGNOSIS — R102 Pelvic and perineal pain: Secondary | ICD-10-CM

## 2019-04-19 ENCOUNTER — Other Ambulatory Visit: Payer: Self-pay | Admitting: Family Medicine

## 2019-04-19 DIAGNOSIS — R102 Pelvic and perineal pain: Secondary | ICD-10-CM

## 2019-05-14 ENCOUNTER — Other Ambulatory Visit: Payer: Self-pay | Admitting: Family Medicine

## 2019-05-14 DIAGNOSIS — R102 Pelvic and perineal pain: Secondary | ICD-10-CM

## 2019-09-08 ENCOUNTER — Other Ambulatory Visit: Payer: Self-pay | Admitting: Family Medicine

## 2019-09-08 DIAGNOSIS — R102 Pelvic and perineal pain: Secondary | ICD-10-CM

## 2019-10-04 ENCOUNTER — Other Ambulatory Visit: Payer: Self-pay | Admitting: Family Medicine

## 2019-10-04 DIAGNOSIS — R102 Pelvic and perineal pain: Secondary | ICD-10-CM

## 2019-10-24 ENCOUNTER — Other Ambulatory Visit: Payer: Self-pay | Admitting: Family Medicine

## 2019-10-24 DIAGNOSIS — R102 Pelvic and perineal pain: Secondary | ICD-10-CM

## 2019-11-06 ENCOUNTER — Other Ambulatory Visit: Payer: Self-pay | Admitting: Family Medicine

## 2019-11-06 DIAGNOSIS — R102 Pelvic and perineal pain: Secondary | ICD-10-CM

## 2020-01-10 ENCOUNTER — Other Ambulatory Visit: Payer: Self-pay | Admitting: Family Medicine

## 2020-01-10 DIAGNOSIS — R102 Pelvic and perineal pain: Secondary | ICD-10-CM

## 2020-01-14 ENCOUNTER — Other Ambulatory Visit: Payer: Self-pay | Admitting: *Deleted

## 2020-04-24 ENCOUNTER — Other Ambulatory Visit: Payer: Self-pay | Admitting: Family Medicine

## 2020-04-24 DIAGNOSIS — R102 Pelvic and perineal pain: Secondary | ICD-10-CM

## 2020-05-04 ENCOUNTER — Other Ambulatory Visit: Payer: Self-pay | Admitting: *Deleted

## 2020-05-04 NOTE — Telephone Encounter (Signed)
erroneous

## 2020-06-25 ENCOUNTER — Other Ambulatory Visit: Payer: Self-pay | Admitting: Family Medicine

## 2020-06-25 DIAGNOSIS — R102 Pelvic and perineal pain: Secondary | ICD-10-CM

## 2020-07-26 ENCOUNTER — Other Ambulatory Visit: Payer: Self-pay | Admitting: Family Medicine

## 2020-07-26 DIAGNOSIS — R102 Pelvic and perineal pain: Secondary | ICD-10-CM

## 2020-08-21 ENCOUNTER — Other Ambulatory Visit: Payer: Self-pay | Admitting: Family Medicine

## 2020-08-21 DIAGNOSIS — R102 Pelvic and perineal pain: Secondary | ICD-10-CM

## 2021-01-05 ENCOUNTER — Other Ambulatory Visit: Payer: Self-pay | Admitting: Family Medicine

## 2021-01-05 DIAGNOSIS — R102 Pelvic and perineal pain: Secondary | ICD-10-CM

## 2021-02-06 ENCOUNTER — Other Ambulatory Visit: Payer: Self-pay | Admitting: Family Medicine

## 2021-02-06 DIAGNOSIS — R102 Pelvic and perineal pain: Secondary | ICD-10-CM

## 2021-03-11 ENCOUNTER — Other Ambulatory Visit: Payer: Self-pay | Admitting: Family Medicine

## 2021-03-11 DIAGNOSIS — R102 Pelvic and perineal pain: Secondary | ICD-10-CM

## 2021-04-15 LAB — EXTERNAL GENERIC LAB PROCEDURE: COLOGUARD: NEGATIVE
